# Patient Record
Sex: Male | Born: 1935 | Race: White | Hispanic: No | Smoking: Former smoker
Health system: Southern US, Community
[De-identification: ages and names within clinical notes are randomized; demographics above are authoritative.]

## PROBLEM LIST (undated history)

## (undated) DIAGNOSIS — R1031 Right lower quadrant pain: Secondary | ICD-10-CM

## (undated) DIAGNOSIS — M069 Rheumatoid arthritis, unspecified: Secondary | ICD-10-CM

## (undated) DIAGNOSIS — N138 Other obstructive and reflux uropathy: Secondary | ICD-10-CM

## (undated) DIAGNOSIS — E669 Obesity, unspecified: Secondary | ICD-10-CM

## (undated) DIAGNOSIS — E785 Hyperlipidemia, unspecified: Secondary | ICD-10-CM

## (undated) DIAGNOSIS — I447 Left bundle-branch block, unspecified: Secondary | ICD-10-CM

## (undated) DIAGNOSIS — I10 Essential (primary) hypertension: Secondary | ICD-10-CM

## (undated) DIAGNOSIS — I639 Cerebral infarction, unspecified: Secondary | ICD-10-CM

## (undated) DIAGNOSIS — I350 Nonrheumatic aortic (valve) stenosis: Secondary | ICD-10-CM

## (undated) DIAGNOSIS — G629 Polyneuropathy, unspecified: Secondary | ICD-10-CM

## (undated) DIAGNOSIS — E039 Hypothyroidism, unspecified: Secondary | ICD-10-CM

## (undated) DIAGNOSIS — I2510 Atherosclerotic heart disease of native coronary artery without angina pectoris: Secondary | ICD-10-CM

## (undated) DIAGNOSIS — N401 Enlarged prostate with lower urinary tract symptoms: Secondary | ICD-10-CM

## (undated) DIAGNOSIS — S46001A Unspecified injury of muscle(s) and tendon(s) of the rotator cuff of right shoulder, initial encounter: Secondary | ICD-10-CM

## (undated) DIAGNOSIS — E119 Type 2 diabetes mellitus without complications: Secondary | ICD-10-CM

## (undated) DIAGNOSIS — M1990 Unspecified osteoarthritis, unspecified site: Secondary | ICD-10-CM

## (undated) HISTORY — PX: OTHER SURGICAL HISTORY: SHX169

## (undated) HISTORY — DX: Hypothyroidism, unspecified: E03.9

## (undated) HISTORY — DX: Essential (primary) hypertension: I10

## (undated) HISTORY — DX: Rheumatoid arthritis, unspecified: M06.9

## (undated) HISTORY — DX: Type 2 diabetes mellitus without complications: E11.9

## (undated) HISTORY — DX: Cerebral infarction, unspecified: I63.9

## (undated) HISTORY — DX: Unspecified injury of muscle(s) and tendon(s) of the rotator cuff of right shoulder, initial encounter: S46.001A

## (undated) HISTORY — DX: Benign prostatic hyperplasia with lower urinary tract symptoms: N40.1

## (undated) HISTORY — DX: Obesity, unspecified: E66.9

## (undated) HISTORY — DX: Other obstructive and reflux uropathy: N13.8

## (undated) HISTORY — DX: Hyperlipidemia, unspecified: E78.5

## (undated) HISTORY — PX: CHOLECYSTECTOMY: SHX55

## (undated) HISTORY — DX: Unspecified osteoarthritis, unspecified site: M19.90

## (undated) HISTORY — DX: Right lower quadrant pain: R10.31

## (undated) HISTORY — PX: TONSILLECTOMY: SUR1361

## (undated) HISTORY — DX: Polyneuropathy, unspecified: G62.9

---

## 2004-06-02 ENCOUNTER — Ambulatory Visit: Payer: Self-pay | Admitting: Internal Medicine

## 2004-11-03 ENCOUNTER — Other Ambulatory Visit: Payer: Self-pay

## 2004-11-03 ENCOUNTER — Observation Stay: Payer: Self-pay | Admitting: Internal Medicine

## 2005-02-07 ENCOUNTER — Other Ambulatory Visit: Payer: Self-pay

## 2005-02-07 ENCOUNTER — Inpatient Hospital Stay: Payer: Self-pay | Admitting: Internal Medicine

## 2005-08-19 ENCOUNTER — Ambulatory Visit (HOSPITAL_COMMUNITY): Admission: RE | Admit: 2005-08-19 | Discharge: 2005-08-19 | Payer: Self-pay | Admitting: Unknown Physician Specialty

## 2005-09-22 ENCOUNTER — Ambulatory Visit: Payer: Self-pay | Admitting: Unknown Physician Specialty

## 2005-09-22 ENCOUNTER — Other Ambulatory Visit: Payer: Self-pay

## 2005-09-29 ENCOUNTER — Ambulatory Visit: Payer: Self-pay | Admitting: Unknown Physician Specialty

## 2005-11-11 ENCOUNTER — Ambulatory Visit: Payer: Self-pay

## 2006-11-01 ENCOUNTER — Ambulatory Visit: Payer: Self-pay | Admitting: Unknown Physician Specialty

## 2010-01-19 ENCOUNTER — Ambulatory Visit: Payer: Self-pay | Admitting: Ophthalmology

## 2010-01-25 ENCOUNTER — Ambulatory Visit: Payer: Self-pay | Admitting: Ophthalmology

## 2010-02-22 ENCOUNTER — Ambulatory Visit: Payer: Self-pay | Admitting: Unknown Physician Specialty

## 2010-02-24 LAB — PATHOLOGY REPORT

## 2011-04-25 ENCOUNTER — Ambulatory Visit: Payer: Self-pay | Admitting: Internal Medicine

## 2012-02-06 ENCOUNTER — Ambulatory Visit: Payer: Self-pay | Admitting: Internal Medicine

## 2012-04-02 ENCOUNTER — Ambulatory Visit: Payer: Self-pay | Admitting: Unknown Physician Specialty

## 2012-07-17 ENCOUNTER — Ambulatory Visit: Payer: Self-pay | Admitting: Unknown Physician Specialty

## 2012-10-19 ENCOUNTER — Ambulatory Visit: Payer: Self-pay | Admitting: Vascular Surgery

## 2013-03-28 ENCOUNTER — Ambulatory Visit: Payer: Self-pay | Admitting: Internal Medicine

## 2013-03-31 ENCOUNTER — Emergency Department: Payer: Self-pay | Admitting: Emergency Medicine

## 2013-03-31 LAB — BASIC METABOLIC PANEL
BUN: 22 mg/dL — ABNORMAL HIGH (ref 7–18)
Calcium, Total: 8.6 mg/dL (ref 8.5–10.1)
Creatinine: 1.1 mg/dL (ref 0.60–1.30)
Potassium: 4 mmol/L (ref 3.5–5.1)

## 2013-03-31 LAB — CBC
HCT: 44.7 % (ref 40.0–52.0)
HGB: 15.5 g/dL (ref 13.0–18.0)
MCHC: 34.7 g/dL (ref 32.0–36.0)
MCV: 92 fL (ref 80–100)
RBC: 4.88 10*6/uL (ref 4.40–5.90)
WBC: 11.7 10*3/uL — ABNORMAL HIGH (ref 3.8–10.6)

## 2013-03-31 LAB — TROPONIN I: Troponin-I: <0.02 ng/mL

## 2013-08-12 DIAGNOSIS — I10 Essential (primary) hypertension: Secondary | ICD-10-CM | POA: Insufficient documentation

## 2013-08-12 DIAGNOSIS — H9190 Unspecified hearing loss, unspecified ear: Secondary | ICD-10-CM | POA: Insufficient documentation

## 2013-08-12 DIAGNOSIS — E139 Other specified diabetes mellitus without complications: Secondary | ICD-10-CM | POA: Insufficient documentation

## 2013-08-13 DIAGNOSIS — I350 Nonrheumatic aortic (valve) stenosis: Secondary | ICD-10-CM | POA: Insufficient documentation

## 2013-12-14 DIAGNOSIS — E785 Hyperlipidemia, unspecified: Secondary | ICD-10-CM | POA: Insufficient documentation

## 2013-12-14 DIAGNOSIS — N400 Enlarged prostate without lower urinary tract symptoms: Secondary | ICD-10-CM | POA: Insufficient documentation

## 2013-12-14 DIAGNOSIS — Z8673 Personal history of transient ischemic attack (TIA), and cerebral infarction without residual deficits: Secondary | ICD-10-CM | POA: Insufficient documentation

## 2013-12-14 DIAGNOSIS — E669 Obesity, unspecified: Secondary | ICD-10-CM | POA: Insufficient documentation

## 2013-12-14 DIAGNOSIS — Z8709 Personal history of other diseases of the respiratory system: Secondary | ICD-10-CM | POA: Insufficient documentation

## 2013-12-14 DIAGNOSIS — M1990 Unspecified osteoarthritis, unspecified site: Secondary | ICD-10-CM | POA: Insufficient documentation

## 2013-12-14 DIAGNOSIS — G629 Polyneuropathy, unspecified: Secondary | ICD-10-CM | POA: Insufficient documentation

## 2013-12-14 DIAGNOSIS — E039 Hypothyroidism, unspecified: Secondary | ICD-10-CM | POA: Insufficient documentation

## 2013-12-16 DIAGNOSIS — I779 Disorder of arteries and arterioles, unspecified: Secondary | ICD-10-CM | POA: Insufficient documentation

## 2013-12-16 DIAGNOSIS — G4700 Insomnia, unspecified: Secondary | ICD-10-CM | POA: Insufficient documentation

## 2014-06-19 ENCOUNTER — Ambulatory Visit: Payer: Self-pay

## 2014-07-17 ENCOUNTER — Ambulatory Visit: Payer: Self-pay | Admitting: Internal Medicine

## 2014-08-07 ENCOUNTER — Observation Stay: Payer: Self-pay | Admitting: Internal Medicine

## 2014-08-07 LAB — URINALYSIS, COMPLETE
BACTERIA: NONE SEEN
BILIRUBIN, UR: NEGATIVE
Blood: NEGATIVE
LEUKOCYTE ESTERASE: NEGATIVE
Nitrite: NEGATIVE
Ph: 5 (ref 4.5–8.0)
Protein: 100
SQUAMOUS EPITHELIAL: NONE SEEN
Specific Gravity: 1.025 (ref 1.003–1.030)

## 2014-08-07 LAB — COMPREHENSIVE METABOLIC PANEL
ALBUMIN: 4.1 g/dL (ref 3.4–5.0)
ALK PHOS: 102 U/L (ref 46–116)
ANION GAP: 5 — AB (ref 7–16)
AST: 18 U/L (ref 15–37)
BUN: 24 mg/dL — AB (ref 7–18)
Bilirubin,Total: 1.2 mg/dL — ABNORMAL HIGH (ref 0.2–1.0)
CHLORIDE: 104 mmol/L (ref 98–107)
CO2: 30 mmol/L (ref 21–32)
CREATININE: 0.9 mg/dL (ref 0.60–1.30)
Calcium, Total: 9.1 mg/dL (ref 8.5–10.1)
EGFR (African American): >60
Glucose: 236 mg/dL — ABNORMAL HIGH (ref 65–99)
OSMOLALITY: 289 (ref 275–301)
Potassium: 4.1 mmol/L (ref 3.5–5.1)
SGPT (ALT): 22 U/L (ref 14–63)
SODIUM: 139 mmol/L (ref 136–145)
Total Protein: 7.1 g/dL (ref 6.4–8.2)

## 2014-08-07 LAB — CBC
HCT: 50.4 % (ref 40.0–52.0)
HGB: 16.8 g/dL (ref 13.0–18.0)
MCH: 31.1 pg (ref 26.0–34.0)
MCHC: 33.4 g/dL (ref 32.0–36.0)
MCV: 93 fL (ref 80–100)
Platelet: 123 10*3/uL — ABNORMAL LOW (ref 150–440)
RBC: 5.41 10*6/uL (ref 4.40–5.90)
RDW: 13 % (ref 11.5–14.5)
WBC: 13.1 10*3/uL — ABNORMAL HIGH (ref 3.8–10.6)

## 2014-08-07 LAB — TROPONIN I

## 2014-08-08 DIAGNOSIS — I350 Nonrheumatic aortic (valve) stenosis: Secondary | ICD-10-CM

## 2014-08-08 LAB — BASIC METABOLIC PANEL
ANION GAP: 9 (ref 7–16)
BUN: 22 mg/dL — ABNORMAL HIGH (ref 7–18)
CALCIUM: 8.4 mg/dL — AB (ref 8.5–10.1)
CREATININE: 0.92 mg/dL (ref 0.60–1.30)
Chloride: 107 mmol/L (ref 98–107)
Co2: 25 mmol/L (ref 21–32)
Glucose: 165 mg/dL — ABNORMAL HIGH (ref 65–99)
Osmolality: 288 (ref 275–301)
Potassium: 3.6 mmol/L (ref 3.5–5.1)
SODIUM: 141 mmol/L (ref 136–145)

## 2014-08-08 LAB — LIPID PANEL
Cholesterol: 148 mg/dL (ref 0–200)
HDL Cholesterol: 28 mg/dL — ABNORMAL LOW (ref 40–60)
LDL CHOLESTEROL, CALC: 81 mg/dL (ref 0–100)
Triglycerides: 195 mg/dL (ref 0–200)
VLDL CHOLESTEROL, CALC: 39 mg/dL (ref 5–40)

## 2014-08-08 LAB — HEMOGLOBIN A1C: Hemoglobin A1C: 7.3 % — ABNORMAL HIGH (ref 4.2–6.3)

## 2014-08-09 LAB — CBC WITH DIFFERENTIAL/PLATELET
BANDS NEUTROPHIL: 1 %
BASOS ABS: 2 %
Comment - H1-Com3: NORMAL
EOS PCT: 4 %
HCT: 43.4 % (ref 40.0–52.0)
HGB: 15.1 g/dL (ref 13.0–18.0)
Lymphocytes: 22 %
MCH: 32.1 pg (ref 26.0–34.0)
MCHC: 34.9 g/dL (ref 32.0–36.0)
MCV: 92 fL (ref 80–100)
MONOS PCT: 10 %
Platelet: 120 10*3/uL — ABNORMAL LOW (ref 150–440)
RBC: 4.71 10*6/uL (ref 4.40–5.90)
RDW: 13.3 % (ref 11.5–14.5)
Segmented Neutrophils: 59 %
Variant Lymphocyte - H1-Rlymph: 2 %
WBC: 10.7 10*3/uL — AB (ref 3.8–10.6)

## 2014-08-09 LAB — BASIC METABOLIC PANEL
Anion Gap: 8 (ref 7–16)
BUN: 19 mg/dL — ABNORMAL HIGH (ref 7–18)
CALCIUM: 8.1 mg/dL — AB (ref 8.5–10.1)
CHLORIDE: 109 mmol/L — AB (ref 98–107)
CO2: 25 mmol/L (ref 21–32)
CREATININE: 0.87 mg/dL (ref 0.60–1.30)
EGFR (African American): >60
Glucose: 150 mg/dL — ABNORMAL HIGH (ref 65–99)
Osmolality: 288 (ref 275–301)
Potassium: 3.4 mmol/L — ABNORMAL LOW (ref 3.5–5.1)
SODIUM: 142 mmol/L (ref 136–145)

## 2014-08-10 LAB — BASIC METABOLIC PANEL
Anion Gap: 7 (ref 7–16)
BUN: 19 mg/dL — AB (ref 7–18)
CALCIUM: 8.2 mg/dL — AB (ref 8.5–10.1)
CHLORIDE: 111 mmol/L — AB (ref 98–107)
Co2: 25 mmol/L (ref 21–32)
Creatinine: 0.88 mg/dL (ref 0.60–1.30)
EGFR (African American): >60
Glucose: 163 mg/dL — ABNORMAL HIGH (ref 65–99)
OSMOLALITY: 291 (ref 275–301)
POTASSIUM: 3.7 mmol/L (ref 3.5–5.1)
SODIUM: 143 mmol/L (ref 136–145)

## 2014-08-10 LAB — CBC WITH DIFFERENTIAL/PLATELET
BASOS ABS: 0.1 10*3/uL (ref 0.0–0.1)
Basophil %: 0.6 %
EOS ABS: 0.2 10*3/uL (ref 0.0–0.7)
Eosinophil %: 2 %
HCT: 42.4 % (ref 40.0–52.0)
HGB: 14.4 g/dL (ref 13.0–18.0)
LYMPHS ABS: 4.3 10*3/uL — AB (ref 1.0–3.6)
Lymphocyte %: 45.7 %
MCH: 31.5 pg (ref 26.0–34.0)
MCHC: 34 g/dL (ref 32.0–36.0)
MCV: 93 fL (ref 80–100)
MONO ABS: 0.7 x10 3/mm (ref 0.2–1.0)
Monocyte %: 7.8 %
NEUTROS PCT: 43.9 %
Neutrophil #: 4.2 10*3/uL (ref 1.4–6.5)
PLATELETS: 120 10*3/uL — AB (ref 150–440)
RBC: 4.58 10*6/uL (ref 4.40–5.90)
RDW: 13.1 % (ref 11.5–14.5)
WBC: 9.5 10*3/uL (ref 3.8–10.6)

## 2014-08-11 LAB — CBC WITH DIFFERENTIAL/PLATELET
Comment - H1-Com1: NORMAL
Eosinophil: 2 %
HCT: 44.6 % (ref 40.0–52.0)
HGB: 15.2 g/dL (ref 13.0–18.0)
Lymphocytes: 26 %
MCH: 31.8 pg (ref 26.0–34.0)
MCHC: 34 g/dL (ref 32.0–36.0)
MCV: 93 fL (ref 80–100)
MONOS PCT: 4 %
PLATELETS: 130 10*3/uL — AB (ref 150–440)
RBC: 4.77 10*6/uL (ref 4.40–5.90)
RDW: 13.3 % (ref 11.5–14.5)
Segmented Neutrophils: 63 %
Variant Lymphocyte - H1-Rlymph: 5 %
WBC: 10 10*3/uL (ref 3.8–10.6)

## 2014-08-11 LAB — BASIC METABOLIC PANEL
Anion Gap: 6 — ABNORMAL LOW (ref 7–16)
BUN: 17 mg/dL (ref 7–18)
CALCIUM: 9 mg/dL (ref 8.5–10.1)
CREATININE: 0.87 mg/dL (ref 0.60–1.30)
Chloride: 108 mmol/L — ABNORMAL HIGH (ref 98–107)
Co2: 26 mmol/L (ref 21–32)
GLUCOSE: 183 mg/dL — AB (ref 65–99)
OSMOLALITY: 286 (ref 275–301)
POTASSIUM: 3.9 mmol/L (ref 3.5–5.1)
SODIUM: 140 mmol/L (ref 136–145)

## 2014-11-02 NOTE — Discharge Summary (Signed)
PATIENT NAME:  Chad Rollins, Chad Rollins MR#:  [id number] DATE OF BIRTH:  Dec 24, 1935  DATE OF ADMISSION:  08/07/2014 DATE OF DISCHARGE:  08/11/2014  ADMISSION DIAGNOSIS: Confusion and disorientation.   HISTORY OF PRESENT ILLNESS: Please see the dictated HPI done by Dr. Bridgett Larsson on 08/07/2014.   PAST MEDICAL HISTORY:  1. Previous stroke.  2. Type 2 diabetes.  3. Diabetic peripheral neuropathy.  4. Benign hypertension.  5. Hyperlipidemia.  6. Rheumatoid arthritis.  7. Obstructive sleep apnea.  8. Hypothyroidism.   MEDICATIONS ON ADMISSION: Please see admission note.   ALLERGIES: TYLENOL, XYLOCAINE, AND MARCAINE.   SOCIAL HISTORY, FAMILY HISTORY, REVIEW OF SYSTEMS: As per admission note.   PHYSICAL EXAMINATION:  GENERAL: The patient was in no acute distress.  VITAL SIGNS: Remarkable for a blood pressure of 199/98 with a temperature of 98.1, a heart rate of 103.  HEENT: Unremarkable.  NECK: Supple without JVD.  LUNGS: Clear.  CARDIAC: Revealed a regular rate and rhythm, normal S1, S2.  ABDOMEN: Soft and nontender.  EXTREMITIES: Without edema.  NEUROLOGIC: Grossly nonfocal other than the patient's mental status.   HOSPITAL COURSE: The patient was admitted with altered mental status. Carotid Dopplers were unremarkable. Echocardiogram was unremarkable. MRI of the brain showed no acute stroke. He was seen by neurology who felt that the patient had a hypertensive encephalopathy. His blood pressure was controlled with medications and his symptoms improved. He was seen by physical therapy who recommended home health. By 08/11/2014, the patient was stable and ready for discharge.   DISCHARGE DIAGNOSES:  1. Altered mental status due to hypertensive encephalopathy.  2. Transient ischemic attack.   3. Type 2 diabetes.  4. Benign hypertension.  5. Hyperlipidemia.  6. Rheumatoid arthritis.  7. Hypothyroidism.   DISCHARGE MEDICATIONS:  1. Astelin nasal spray 1 puff in each nostril b.i.d.   2. Brimonidine eye drops as directed.  3. BuSpar 15 mg p.o. t.i.d.  4. Vitamin D3 2000 units p.o. daily.  5. B12 1000 mcg p.o. daily.  6. Timolol eye drops as directed.  7. Proscar 5 mg p.o. daily.  8. Flonase 2 puffs in each  nostril daily.  9. Latanoprost eyedrops as directed.  10. Synthroid 75 mcg p.o. daily.  11. Vigamox eyedrops as directed.  12. Prednisolone eyedrops as directed.  13. Flomax 0.4 mg p.o. daily.  14. Glipizide 5 mg p.o. b.i.d.  15. Metformin 1000 mg p.o. b.i.d.  16. Lopressor 25 mg p.o. b.i.d.  17. Norco 5/325 one p.o. q. 4 hours p.r.n. pain.  18. Losartan 50 mg p.o. b.i.d.  19. Lipitor 20 mg p.o. daily.  20. Aggrenox 1 p.o. b.i.d.  21. Nystatin powder apply t.i.d. to affected area.   FOLLOWUP PLANS AND APPOINTMENTS: The patient was discharged on an 1800 calorie ADA diet. He will be followed by home health physical therapy and nursing. He will follow with me in 1-2 weeks. Sooner if needed.    ____________________________ Leonie Douglas Doy Hutching, MD jds:bu D: 08/18/2014 10:25:43 ET T: 08/18/2014 13:04:38 ET JOB#: [id number]  cc: Leonie Douglas. Doy Hutching, MD, <Dictator> Sudie Bandel Lennice Sites MD ELECTRONICALLY SIGNED 08/18/2014 20:50

## 2014-11-02 NOTE — H&P (Signed)
PATIENT NAME:  Chad Rollins, Chad Rollins MR#:  [id number] DATE OF BIRTH:  1935-12-14  DATE OF ADMISSION:  08/07/2014  PRIMARY CARE PHYSICIAN: Leonie Douglas. Doy Hutching, MD    REFERRING PHYSICIAN: Jimmye Norman.  CHIEF COMPLAINT: Confusion and disorientation today.   HISTORY OF PRESENT ILLNESS: A 79 year old Caucasian male with a history of CVA, hypertension, diabetes, hyperlipidemia, who was sent from home due to confusion and disorientation today. The patient was just given Ativan, is sedating, unable to answer any questions. According to the patient's daughter, the patient was fine until today. The patient was noticed to have confusion and disorientation at home by a caregiver, so the patient was sent to the ED for further evaluation. Since the patient has claustrophobia, the patient was given Ativan for MRI of the brain.   PAST MEDICAL HISTORY: CVA on the left side, diabetes with peripheral neuropathy, hypertension, hyperlipidemia, hypothyroidism, rheumatoid arthritis, obstructive sleep apnea, right rotator cuff injury not requiring surgery.   PAST SURGICAL HISTORY: Cholecystectomy, tonsillectomy, upper palate surgery for obstructive sleep apnea, trigger finger release, and glaucoma with macular degeneration, status post laser treatment recently.   FAMILY HISTORY: Mother died at 79 secondary to diabetes and diabetic complications. Father died  in his 32s.   SOCIAL HISTORY: Quit smoking in 1975. No history of alcohol abuse or drug abuse.   ALLERGIES:  MARCAINE, TYLENOL, XYLOCAINE   HOME MEDICATIONS: Tramadol 50 mg p.o. 1-2 tablets p.r.n., Protonix 40 mg p.o. daily, Percocet 1-2 tablets 5/325, 1-2 tablets p.r.n., metformin 1000 mg p.o. b.i.d., lovastatin 40 mg p.o. daily, levothyroxine 75 mcg p.o. daily, glipizide 5 mg p.o. b.i.d., Aggrenox 1 cap p.o. b.i.d., Aleve  2 tablets b.i.d. p.o.    REVIEW OF SYSTEMS: Unable to obtain at this time.   PHYSICAL EXAMINATION: VITAL SIGNS: Temperature 98.1, blood pressure  168/89, was 199/98; pulse 103, oxygen saturation 98% on room air.  GENERAL: The patient is sleepy, not following commands, in no acute distress, obese.  HEENT: Pupils are round, equal 2 mm in diameter, not reactive to light. No discharge from ear or nose.  NECK: Supple. No JVD or carotid bruit. No lymphadenopathy and no thyromegaly, unable to do oral exam.  CARDIOVASCULAR: S1, S2 regular rate and rhythm. No murmurs or gallops.  PULMONARY: Bilateral air entry. No wheezing or rales. No use of accessory muscles to breathe.  ABDOMEN: Soft. No distention or tenderness. No organomegaly. Bowel sounds present.  EXTREMITIES: No edema, clubbing, or cyanosis. Bilateral pedal pulses present.  SKIN: No rash or jaundice.  NEUROLOGICAL: The patient is sleeping, not following commands, unable to examine at this time.   IMAGING STUDIES: CAT scan of head showed mild chronic ischemic white matter disease, no acute intracranial abnormality. Chest x-ray: No acute findings.   LABORATORY DATA: WBC 13.1, hemoglobin 16.8, platelets 123. Glucose was 236, BUN 24, creatinine 0.9. Electrolytes normal. Troponin less than 0.02.   IMPRESSIONS: 1. Altered mental status, need to rule out cerebrovascular accident.  2. Hypertension.  3. Diabetes.  4. Dehydration.  5. Leukocytosis, need to rule out urinary tract infection.  6. Hyperlipidemia.  7. Obstructive sleep apnea.   PLAN OF TREATMENT: 1. The patient will be placed for observation. We will continue Aggrenox and statin, and get an MRI of the brain, echocardiograph, and carotid duplex.  2. Aspiration and fall precaution.  3. For dehydration, we will give IV fluid support and follow up BMP.  4. For leukocytosis, we will check a urinalysis.  5. The patient also has a small thrombocytopenia,  which is chronic. Will follow up a CBC.  6. I discussed the patient's condition and plan of treatment with the patient's daughter, who is the health power of attorney. She said the  patient is full code for now.   TIME SPENT: About 52 minutes.    ____________________________ Demetrios Loll, MD qc:mw D: 08/07/2014 15:50:04 ET T: 08/07/2014 16:15:20 ET JOB#: [id number]  cc: Demetrios Loll, MD, <Dictator> Demetrios Loll MD ELECTRONICALLY SIGNED 08/11/2014 11:54

## 2014-11-02 NOTE — Consult Note (Signed)
PATIENT NAME:  Chad Rollins, Chad Rollins MR#:  [id number] DATE OF BIRTH:  1935/08/12  DATE OF CONSULTATION:  08/09/2014  REASON FOR CONSULTATION: Confusion.   HISTORY OF PRESENT ILLNESS: This is a 32 gentleman with past medical history of stroke, hypertension, diabetes, hyperlipidemia, presenting with confusion and disorientation. Throughout the hospital stay, the patient's mental status has improved. Status post MRI of the brain that shows small vessel disease, old ischemic strokes.   PAST MEDICAL HISTORY: Left-sided stroke, diabetes and peripheral neuropathy.   FAMILY HISTORY: Mother died at the age of 60 due to diabetes. Father died in his 38s.   SOCIAL HISTORY: Quit smoking in 1975. No EtOH or drug use.   HOME MEDICATIONS: Have been reviewed.   IMAGING: As described above.   NEUROLOGIC EVALUATION: The patient is awake and alert to his name, hospital name, as well as the month. Could not tell me the date, and was confused of the reason why he is in the hospital. Extraocular movements intact. Speech is fluent. No signs of dysarthria. No signs of aphasia. Extraocular movements are intact. Tongue is midline. Uvula elevates symmetrically. Shoulder shrug is intact. Motor strength: Slight drift on the right upper extremity from the chronic stroke. No other motor deficits. Sensation intact to light touch and temperature. Coordination: Finger-to-nose intact.   IMPRESSION: A 79 year old male with history of stroke, hypertension, hyperlipidemia, presented with confusion, disorientation. The patient's mental status appears to have improved. MRI of the brain: No acute intracranial abnormality. Likely dehydration. The patient is getting hydrated with IV fluids,  combination with delirium. Mental status is improving. No further intervention from a neurological standpoint. I do not think this was a seizure activity, and no need for electroencephalogram or further imaging.   Thank you very much. It was a pleasure seeing  this patient.  ____________________________ Leotis Pain, MD yz:ap D: 08/09/2014 14:08:35 ET T: 08/09/2014 14:44:21 ET JOB#: [id number]  cc: Leotis Pain, MD, <Dictator> Leotis Pain MD ELECTRONICALLY SIGNED 08/12/2014 12:10

## 2014-12-08 DIAGNOSIS — E119 Type 2 diabetes mellitus without complications: Secondary | ICD-10-CM | POA: Insufficient documentation

## 2014-12-08 DIAGNOSIS — H409 Unspecified glaucoma: Secondary | ICD-10-CM | POA: Insufficient documentation

## 2015-01-16 ENCOUNTER — Encounter: Payer: Self-pay | Admitting: *Deleted

## 2015-01-21 ENCOUNTER — Ambulatory Visit (INDEPENDENT_AMBULATORY_CARE_PROVIDER_SITE_OTHER): Payer: Medicare Other | Admitting: Urology

## 2015-01-21 ENCOUNTER — Encounter: Payer: Self-pay | Admitting: Urology

## 2015-01-21 VITALS — BP 124/67 | HR 61 | Ht 69.0 in | Wt 218.1 lb

## 2015-01-21 DIAGNOSIS — N401 Enlarged prostate with lower urinary tract symptoms: Secondary | ICD-10-CM

## 2015-01-21 DIAGNOSIS — N138 Other obstructive and reflux uropathy: Secondary | ICD-10-CM | POA: Insufficient documentation

## 2015-01-21 DIAGNOSIS — R1031 Right lower quadrant pain: Secondary | ICD-10-CM | POA: Diagnosis not present

## 2015-01-21 LAB — BLADDER SCAN AMB NON-IMAGING: SCAN RESULT: 44

## 2015-01-21 NOTE — Progress Notes (Signed)
01/21/2015 3:53 PM   Chad Rollins December 21, 1935 [id number]  Referring provider: No referring provider defined for this encounter.  Chief Complaint  Patient presents with  . Benign Prostatic Hypertrophy    6 month follow up  . Abdominal Pain    RLQ    HPI: Mr. Devita is a 79 year old white male who has a history of BPH with LUTS who presents for a 6 months follow up.  His IPSS score is 7, which is mild lower urinary tract symptomatology. He is mostly satisfied with his quality life due to his urinary symptoms.  His PVR is 44 mL.  He has not had any recent fevers, chills, nausea or vomiting. He also denies any dysuria, gross hematuria or suprapubic pain.  He has been experiencing intermittent left lower right quadrant pain which can become quite intense at times for the last several months. He is had several CT scans and no etiology can be found for his pain. He has an upcoming appointment with gastroenterology in September for further workup of this pain.      IPSS      01/21/15 1500       International Prostate Symptom Score   How often have you had the sensation of not emptying your bladder? Not at All  0     How often have you had to urinate less than every two hours? Less than half the time     How often have you found you stopped and started again several times when you urinated? Less than 1 in 5 times     How often have you found it difficult to postpone urination? Less than half the time     How often have you had a weak urinary stream? Not at All     How often have you had to strain to start urination? Not at All     How many times did you typically get up at night to urinate? 2 Times     Total IPSS Score 7     Quality of Life due to urinary symptoms   If you were to spend the rest of your life with your urinary condition just the way it is now how would you feel about that? Mostly Satisfied        Score:  1-7 Mild 8-19 Moderate 20-35 Severe   PMH: Past  Medical History  Diagnosis Date  . Rheumatoid arthritis   . Stroke   . Peripheral neuropathy   . Diabetes mellitus   . HLD (hyperlipidemia)   . Allergic rhinitis   . Aortic stenosis   . Hypothyroidism   . Injury of right rotator cuff   . Osteoarthritis   . Obesity   . Continuous right lower quadrant pain   . BPH with obstruction/lower urinary tract symptoms   . HTN (hypertension)     Surgical History: Past Surgical History  Procedure Laterality Date  . Cholecystectomy    . Surgery for sleep apnea    . Lipoma excisions    . Tonsillectomy      Home Medications:    Medication List       This list is accurate as of: 01/21/15  3:53 PM.  Always use your most recent med list.               AGGRENOX 200-25 MG per 12 hr capsule  Generic drug:  dipyridamole-aspirin     aspirin EC 81 MG tablet  Take 81  mg by mouth daily.     atorvastatin 20 MG tablet  Commonly known as:  LIPITOR  Take by mouth.     AZELASTINE-FLUTICASONE NA  Place into the nose as needed.     brimonidine 0.1 % Soln  Commonly known as:  ALPHAGAN P     busPIRone 15 MG tablet  Commonly known as:  BUSPAR  Take by mouth.     carboxymethylcellulose 0.5 % Soln  Commonly known as:  REFRESH PLUS  Apply to eye.     cloNIDine 0.1 MG tablet  Commonly known as:  CATAPRES  Take by mouth.     clopidogrel 75 MG tablet  Commonly known as:  PLAVIX  Take 75 mg by mouth daily.     dorzolamide 2 % ophthalmic solution  Commonly known as:  TRUSOPT  Apply to eye.     dutasteride 0.5 MG capsule  Commonly known as:  AVODART  Take 0.5 mg by mouth daily.     finasteride 5 MG tablet  Commonly known as:  PROSCAR  Take 5 mg by mouth daily.     fluocinonide cream 0.05 %  Commonly known as:  LIDEX     glipiZIDE 5 MG tablet  Commonly known as:  GLUCOTROL  Take 5 mg by mouth daily before breakfast.     HYDROcodone-acetaminophen 5-325 MG per tablet  Commonly known as:  NORCO/VICODIN  Take by mouth.      latanoprost 0.005 % ophthalmic solution  Commonly known as:  XALATAN  Apply to eye.     levothyroxine 75 MCG tablet  Commonly known as:  SYNTHROID, LEVOTHROID  Take 75 mcg by mouth daily before breakfast.     losartan 50 MG tablet  Commonly known as:  COZAAR  Take by mouth.     metFORMIN 1000 MG tablet  Commonly known as:  GLUCOPHAGE  Take 1,000 mg by mouth 2 (two) times daily with a meal.     methocarbamol 500 MG tablet  Commonly known as:  ROBAXIN     metoprolol tartrate 25 MG tablet  Commonly known as:  LOPRESSOR  Take by mouth.     naproxen sodium 220 MG tablet  Commonly known as:  ANAPROX  Take 220 mg by mouth 2 (two) times daily with a meal.     nystatin powder  Commonly known as:  MYCOSTATIN     prednisoLONE acetate 1 % ophthalmic suspension  Commonly known as:  PRED FORTE  Apply to eye.     RA VITAMIN B-12 TR 1000 MCG Tbcr  Generic drug:  Cyanocobalamin  Take by mouth.     sodium chloride 5 % ophthalmic solution  Commonly known as:  MURO 128  Apply to eye.     tamsulosin 0.4 MG Caps capsule  Commonly known as:  FLOMAX  Take by mouth.     timolol 0.5 % ophthalmic solution  Commonly known as:  TIMOPTIC  Apply to eye.     Vitamin D3 2000 UNITS capsule  Take by mouth.     zolpidem 10 MG tablet  Commonly known as:  AMBIEN  Take 10 mg by mouth at bedtime as needed for sleep.        Allergies:  Allergies  Allergen Reactions  . Tylenol [Acetaminophen]     Family History: Family History  Problem Relation Age of Onset  . Prostate cancer Neg Hx   . Bladder Cancer Neg Hx   . Kidney disease Neg Hx     Social  History:  reports that he has quit smoking. He does not have any smokeless tobacco history on file. He reports that he does not drink alcohol or use illicit drugs.  ROS: UROLOGY Frequent Urination?: No Hard to postpone urination?: No Burning/pain with urination?: No Get up at night to urinate?: No Leakage of urine?: No Urine stream  starts and stops?: No Do you have to strain to urinate?: No Blood in urine?: No Urinary tract infection?: No Sexually transmitted disease?: No Injury to kidneys or bladder?: No Painful intercourse?: No Weak stream?: No Erection problems?: No Penile pain?: No  Gastrointestinal Nausea?: No Vomiting?: No Indigestion/heartburn?: No Diarrhea?: No Constipation?: No  Constitutional Fever: No Night sweats?: No Weight loss?: No Fatigue?: No  Skin Skin rash/lesions?: No Itching?: No  Eyes Blurred vision?: No Double vision?: No  Ears/Nose/Throat Sore throat?: No Sinus problems?: No  Hematologic/Lymphatic Swollen glands?: No Easy bruising?: No  Cardiovascular Leg swelling?: No Chest pain?: No  Respiratory Cough?: No Shortness of breath?: No  Endocrine Excessive thirst?: No  Musculoskeletal Back pain?: Yes Joint pain?: No  Neurological Headaches?: No Dizziness?: Yes  Psychologic Depression?: No Anxiety?: No  Physical Exam: BP 124/67 mmHg  Pulse 61  Ht 5\' 9"  (1.753 m)  Wt 218 lb 1.6 oz (98.93 kg)  BMI 32.19 kg/m2  GU: Patient with circumcised phallus.  Urethral meatus is patent.  No penile discharge. No penile lesions or rashes. Scrotum without lesions, cysts, rashes and/or edema.  Testicles are located scrotally bilaterally. No masses are appreciated in the testicles. Left and right epididymis are normal.   Rectal: Patient with  normal sphincter tone. Perineum without scarring or rashes. No rectal masses are appreciated. Prostate is approximately 45 grams, no nodules are appreciated. Seminal vesicles are normal.   Laboratory Data: Results for orders placed or performed in visit on 01/21/15  BLADDER SCAN AMB NON-IMAGING  Result Value Ref Range   Scan Result 44    Lab Results  Component Value Date   WBC 10.0 08/11/2014   HGB 15.2 08/11/2014   HCT 44.6 08/11/2014   MCV 93 08/11/2014   PLT 130* 08/11/2014    Lab Results  Component Value Date    CREATININE 0.87 08/11/2014    No results found for: PSA  No results found for: TESTOSTERONE  No results found for: HGBA1C  Urinalysis    Component Value Date/Time   COLORURINE Yellow 08/07/2014 2156   APPEARANCEUR Clear 08/07/2014 2156   LABSPEC 1.025 08/07/2014 2156   PHURINE 5.0 08/07/2014 2156   GLUCOSEU >=500 08/07/2014 2156   HGBUR Negative 08/07/2014 2156   BILIRUBINUR Negative 08/07/2014 2156   KETONESUR 1+ 08/07/2014 2156   PROTEINUR 100 mg/dL 08/07/2014 2156   NITRITE Negative 08/07/2014 2156   LEUKOCYTESUR Negative 08/07/2014 2156    Pertinent Imaging:   Assessment & Plan:    1. BPH (benign prostatic hyperplasia) with LUTS:   IPSS 7/2.  PVR 44 mL. PSA of 0.7 on 07/23/2014.  He will continue the finasteride. He will follow-up in one year's time for IPSS score and PVR.  - PSA - BLADDER SCAN AMB NON-IMAGING  2. RLQ:   Patient appears to be emptying his bladder completely and his urinalysis is negative. There is no urological etiology for the right lower quadrant pain. Patient will be evaluated by gastroenterology in September 2016.   No Follow-up on file.  Zara Council, [facility] [address] [place], [place] [address] [phone]

## 2015-01-22 ENCOUNTER — Telehealth: Payer: Self-pay

## 2015-01-22 LAB — PSA: PROSTATE SPECIFIC AG, SERUM: 0.5 ng/mL (ref 0.0–4.0)

## 2015-01-22 NOTE — Telephone Encounter (Signed)
-----   Message from Nori Riis, PA-C sent at 01/22/2015  8:24 AM EDT ----- Patient's PSA is stable.  We will see him in one year.  Please have him come in a week before his appointment for his blood work, so that I will have it available at his appointment with me.

## 2015-01-22 NOTE — Telephone Encounter (Signed)
Spoke with pt in reference to lab results. Pt voiced understanding.  

## 2015-03-06 ENCOUNTER — Other Ambulatory Visit: Payer: Self-pay

## 2015-03-06 DIAGNOSIS — N400 Enlarged prostate without lower urinary tract symptoms: Secondary | ICD-10-CM

## 2015-03-06 DIAGNOSIS — N200 Calculus of kidney: Secondary | ICD-10-CM

## 2015-03-06 DIAGNOSIS — Z86010 Personal history of colonic polyps: Secondary | ICD-10-CM | POA: Insufficient documentation

## 2015-03-06 MED ORDER — FINASTERIDE 5 MG PO TABS: 5.0000 mg | ORAL_TABLET | Freq: Every day | ORAL | Status: DC

## 2015-04-01 ENCOUNTER — Other Ambulatory Visit: Payer: Self-pay

## 2015-04-01 DIAGNOSIS — N400 Enlarged prostate without lower urinary tract symptoms: Secondary | ICD-10-CM

## 2015-04-01 MED ORDER — FINASTERIDE 5 MG PO TABS: 5.0000 mg | ORAL_TABLET | Freq: Every day | ORAL | Status: DC

## 2015-05-06 ENCOUNTER — Other Ambulatory Visit: Payer: Self-pay

## 2015-05-06 DIAGNOSIS — N400 Enlarged prostate without lower urinary tract symptoms: Secondary | ICD-10-CM

## 2015-05-06 MED ORDER — FINASTERIDE 5 MG PO TABS: 5.0000 mg | ORAL_TABLET | Freq: Every day | ORAL | Status: DC

## 2015-08-24 ENCOUNTER — Encounter: Payer: Self-pay | Admitting: Emergency Medicine

## 2015-08-24 ENCOUNTER — Emergency Department: Payer: PPO

## 2015-08-24 ENCOUNTER — Inpatient Hospital Stay
Admission: EM | Admit: 2015-08-24 | Discharge: 2015-08-26 | DRG: 558 | Disposition: A | Discharge status: Skilled Nursing Facility | Payer: PPO | Attending: Internal Medicine | Admitting: Internal Medicine

## 2015-08-24 DIAGNOSIS — Z8673 Personal history of transient ischemic attack (TIA), and cerebral infarction without residual deficits: Secondary | ICD-10-CM | POA: Diagnosis not present

## 2015-08-24 DIAGNOSIS — I10 Essential (primary) hypertension: Secondary | ICD-10-CM | POA: Diagnosis present

## 2015-08-24 DIAGNOSIS — Z9889 Other specified postprocedural states: Secondary | ICD-10-CM

## 2015-08-24 DIAGNOSIS — Y92009 Unspecified place in unspecified non-institutional (private) residence as the place of occurrence of the external cause: Secondary | ICD-10-CM

## 2015-08-24 DIAGNOSIS — W1830XA Fall on same level, unspecified, initial encounter: Secondary | ICD-10-CM | POA: Diagnosis not present

## 2015-08-24 DIAGNOSIS — D72829 Elevated white blood cell count, unspecified: Secondary | ICD-10-CM | POA: Diagnosis present

## 2015-08-24 DIAGNOSIS — M069 Rheumatoid arthritis, unspecified: Secondary | ICD-10-CM | POA: Diagnosis present

## 2015-08-24 DIAGNOSIS — E860 Dehydration: Secondary | ICD-10-CM | POA: Diagnosis not present

## 2015-08-24 DIAGNOSIS — R531 Weakness: Secondary | ICD-10-CM

## 2015-08-24 DIAGNOSIS — E1140 Type 2 diabetes mellitus with diabetic neuropathy, unspecified: Secondary | ICD-10-CM | POA: Diagnosis not present

## 2015-08-24 DIAGNOSIS — Z9049 Acquired absence of other specified parts of digestive tract: Secondary | ICD-10-CM

## 2015-08-24 DIAGNOSIS — R270 Ataxia, unspecified: Secondary | ICD-10-CM

## 2015-08-24 DIAGNOSIS — R7989 Other specified abnormal findings of blood chemistry: Secondary | ICD-10-CM | POA: Diagnosis not present

## 2015-08-24 DIAGNOSIS — E039 Hypothyroidism, unspecified: Secondary | ICD-10-CM | POA: Diagnosis present

## 2015-08-24 DIAGNOSIS — R748 Abnormal levels of other serum enzymes: Secondary | ICD-10-CM | POA: Diagnosis not present

## 2015-08-24 DIAGNOSIS — S0990XA Unspecified injury of head, initial encounter: Secondary | ICD-10-CM | POA: Diagnosis not present

## 2015-08-24 DIAGNOSIS — Z7982 Long term (current) use of aspirin: Secondary | ICD-10-CM

## 2015-08-24 DIAGNOSIS — M6282 Rhabdomyolysis: Principal | ICD-10-CM | POA: Diagnosis present

## 2015-08-24 DIAGNOSIS — I248 Other forms of acute ischemic heart disease: Secondary | ICD-10-CM | POA: Diagnosis not present

## 2015-08-24 DIAGNOSIS — R296 Repeated falls: Secondary | ICD-10-CM | POA: Diagnosis not present

## 2015-08-24 DIAGNOSIS — R778 Other specified abnormalities of plasma proteins: Secondary | ICD-10-CM | POA: Diagnosis present

## 2015-08-24 DIAGNOSIS — Z87891 Personal history of nicotine dependence: Secondary | ICD-10-CM | POA: Diagnosis not present

## 2015-08-24 DIAGNOSIS — J9811 Atelectasis: Secondary | ICD-10-CM | POA: Diagnosis not present

## 2015-08-24 DIAGNOSIS — E119 Type 2 diabetes mellitus without complications: Secondary | ICD-10-CM | POA: Diagnosis not present

## 2015-08-24 DIAGNOSIS — Z79899 Other long term (current) drug therapy: Secondary | ICD-10-CM

## 2015-08-24 DIAGNOSIS — Z8042 Family history of malignant neoplasm of prostate: Secondary | ICD-10-CM

## 2015-08-24 DIAGNOSIS — Z841 Family history of disorders of kidney and ureter: Secondary | ICD-10-CM | POA: Diagnosis not present

## 2015-08-24 LAB — LACTIC ACID, PLASMA
LACTIC ACID, VENOUS: 1.7 mmol/L (ref 0.5–2.0)
Lactic Acid, Venous: 2.8 mmol/L (ref 0.5–2.0)

## 2015-08-24 LAB — GLUCOSE, CAPILLARY
Glucose-Capillary: 197 mg/dL — ABNORMAL HIGH (ref 65–99)
Glucose-Capillary: 85 mg/dL (ref 65–99)

## 2015-08-24 LAB — COMPREHENSIVE METABOLIC PANEL
ALT: 18 U/L (ref 17–63)
ANION GAP: 9 (ref 5–15)
AST: 35 U/L (ref 15–41)
Albumin: 4.1 g/dL (ref 3.5–5.0)
Alkaline Phosphatase: 72 U/L (ref 38–126)
BILIRUBIN TOTAL: 1.3 mg/dL — AB (ref 0.3–1.2)
BUN: 26 mg/dL — ABNORMAL HIGH (ref 6–20)
CO2: 25 mmol/L (ref 22–32)
Calcium: 8.9 mg/dL (ref 8.9–10.3)
Chloride: 107 mmol/L (ref 101–111)
Creatinine, Ser: 1.25 mg/dL — ABNORMAL HIGH (ref 0.61–1.24)
GFR calc Af Amer: >60 mL/min (ref >60)
GFR, EST NON AFRICAN AMERICAN: 53 mL/min — AB (ref >60)
Glucose, Bld: 241 mg/dL — ABNORMAL HIGH (ref 65–99)
POTASSIUM: 4 mmol/L (ref 3.5–5.1)
Sodium: 141 mmol/L (ref 135–145)
TOTAL PROTEIN: 6.7 g/dL (ref 6.5–8.1)

## 2015-08-24 LAB — CBC WITH DIFFERENTIAL/PLATELET
BASOS ABS: 0 10*3/uL (ref 0–0.1)
Basophils Relative: 0 %
EOS ABS: 0 10*3/uL (ref 0–0.7)
EOS PCT: 0 %
HCT: 41.7 % (ref 40.0–52.0)
Hemoglobin: 14 g/dL (ref 13.0–18.0)
LYMPHS ABS: 4.6 10*3/uL — AB (ref 1.0–3.6)
Lymphocytes Relative: 30 %
MCH: 30.5 pg (ref 26.0–34.0)
MCHC: 33.5 g/dL (ref 32.0–36.0)
MCV: 91.1 fL (ref 80.0–100.0)
MONO ABS: 1.5 10*3/uL — AB (ref 0.2–1.0)
Monocytes Relative: 10 %
Neutro Abs: 9.5 10*3/uL — ABNORMAL HIGH (ref 1.4–6.5)
Neutrophils Relative %: 60 %
PLATELETS: 124 10*3/uL — AB (ref 150–440)
RBC: 4.58 MIL/uL (ref 4.40–5.90)
RDW: 13.3 % (ref 11.5–14.5)
WBC: 15.6 10*3/uL — AB (ref 3.8–10.6)

## 2015-08-24 LAB — URINALYSIS COMPLETE WITH MICROSCOPIC (ARMC ONLY)
BILIRUBIN URINE: NEGATIVE
Bacteria, UA: NONE SEEN
Glucose, UA: >500 mg/dL — AB
LEUKOCYTES UA: NEGATIVE
Nitrite: NEGATIVE
PH: 5 (ref 5.0–8.0)
Protein, ur: 100 mg/dL — AB
SPECIFIC GRAVITY, URINE: 1.021 (ref 1.005–1.030)

## 2015-08-24 LAB — URINE DRUG SCREEN, QUALITATIVE (ARMC ONLY)
AMPHETAMINES, UR SCREEN: NOT DETECTED
BARBITURATES, UR SCREEN: NOT DETECTED
BENZODIAZEPINE, UR SCRN: NOT DETECTED
Cannabinoid 50 Ng, Ur ~~LOC~~: NOT DETECTED
Cocaine Metabolite,Ur ~~LOC~~: NOT DETECTED
MDMA (Ecstasy)Ur Screen: NOT DETECTED
Methadone Scn, Ur: NOT DETECTED
Opiate, Ur Screen: NOT DETECTED
PHENCYCLIDINE (PCP) UR S: NOT DETECTED
Tricyclic, Ur Screen: POSITIVE — AB

## 2015-08-24 LAB — ETHANOL

## 2015-08-24 LAB — CK: CK TOTAL: 1405 U/L — AB (ref 49–397)

## 2015-08-24 LAB — TROPONIN I
TROPONIN I: 0.53 ng/mL — AB (ref <0.031)
TROPONIN I: 0.92 ng/mL — AB (ref <0.031)

## 2015-08-24 MED ORDER — BUSPIRONE HCL 5 MG PO TABS
15.0000 mg | ORAL_TABLET | Freq: Three times a day (TID) | ORAL | Status: DC
  Administered 2015-08-24 – 2015-08-26 (×5): 15 mg via ORAL
  Filled 2015-08-24 (×5): qty 3

## 2015-08-24 MED ORDER — ALUM & MAG HYDROXIDE-SIMETH 200-200-20 MG/5ML PO SUSP: 30.0000 mL | Freq: Four times a day (QID) | ORAL | Status: DC | PRN

## 2015-08-24 MED ORDER — INSULIN ASPART 100 UNIT/ML ~~LOC~~ SOLN: 0.0000 [IU] | Freq: Every day | SUBCUTANEOUS | Status: DC

## 2015-08-24 MED ORDER — SODIUM CHLORIDE 0.9% FLUSH
3.0000 mL | Freq: Two times a day (BID) | INTRAVENOUS | Status: DC
  Administered 2015-08-26: 3 mL via INTRAVENOUS

## 2015-08-24 MED ORDER — OCUVITE-LUTEIN PO CAPS
1.0000 | ORAL_CAPSULE | Freq: Two times a day (BID) | ORAL | Status: DC
  Administered 2015-08-24 – 2015-08-26 (×4): 1 via ORAL
  Filled 2015-08-24 (×8): qty 1

## 2015-08-24 MED ORDER — LOSARTAN POTASSIUM 50 MG PO TABS
50.0000 mg | ORAL_TABLET | Freq: Two times a day (BID) | ORAL | Status: DC
  Administered 2015-08-24 – 2015-08-26 (×4): 50 mg via ORAL
  Filled 2015-08-24 (×4): qty 1

## 2015-08-24 MED ORDER — ONDANSETRON HCL 4 MG/2ML IJ SOLN: 4.0000 mg | Freq: Four times a day (QID) | INTRAMUSCULAR | Status: DC | PRN

## 2015-08-24 MED ORDER — INSULIN ASPART 100 UNIT/ML ~~LOC~~ SOLN
0.0000 [IU] | Freq: Three times a day (TID) | SUBCUTANEOUS | Status: DC
  Administered 2015-08-25: 2 [IU] via SUBCUTANEOUS
  Administered 2015-08-25: 3 [IU] via SUBCUTANEOUS
  Administered 2015-08-26 (×2): 2 [IU] via SUBCUTANEOUS
  Filled 2015-08-24 (×2): qty 2
  Filled 2015-08-24: qty 3
  Filled 2015-08-24: qty 2

## 2015-08-24 MED ORDER — LATANOPROST 0.005 % OP SOLN
1.0000 [drp] | Freq: Every day | OPHTHALMIC | Status: DC
  Administered 2015-08-24 – 2015-08-25 (×2): 1 [drp] via OPHTHALMIC
  Filled 2015-08-24: qty 2.5

## 2015-08-24 MED ORDER — METOPROLOL TARTRATE 25 MG PO TABS
25.0000 mg | ORAL_TABLET | Freq: Two times a day (BID) | ORAL | Status: DC
  Administered 2015-08-25 – 2015-08-26 (×3): 25 mg via ORAL
  Filled 2015-08-24 (×5): qty 1

## 2015-08-24 MED ORDER — DORZOLAMIDE HCL-TIMOLOL MAL 2-0.5 % OP SOLN
1.0000 [drp] | Freq: Two times a day (BID) | OPHTHALMIC | Status: DC
  Administered 2015-08-25: 1 [drp] via OPHTHALMIC
  Filled 2015-08-24 (×2): qty 10

## 2015-08-24 MED ORDER — IPRATROPIUM-ALBUTEROL 0.5-2.5 (3) MG/3ML IN SOLN
3.0000 mL | RESPIRATORY_TRACT | Status: DC
  Administered 2015-08-24 – 2015-08-25 (×6): 3 mL via RESPIRATORY_TRACT
  Filled 2015-08-24 (×5): qty 3

## 2015-08-24 MED ORDER — METFORMIN HCL 500 MG PO TABS: 1000.0000 mg | ORAL_TABLET | Freq: Two times a day (BID) | ORAL | Status: DC

## 2015-08-24 MED ORDER — SENNOSIDES-DOCUSATE SODIUM 8.6-50 MG PO TABS: 1.0000 | ORAL_TABLET | Freq: Every evening | ORAL | Status: DC | PRN

## 2015-08-24 MED ORDER — CLONIDINE HCL 0.1 MG PO TABS
0.1000 mg | ORAL_TABLET | Freq: Two times a day (BID) | ORAL | Status: DC
  Administered 2015-08-24 – 2015-08-26 (×4): 0.1 mg via ORAL
  Filled 2015-08-24 (×4): qty 1

## 2015-08-24 MED ORDER — GLIPIZIDE 5 MG PO TABS: 5.0000 mg | ORAL_TABLET | Freq: Two times a day (BID) | ORAL | Status: DC

## 2015-08-24 MED ORDER — ATORVASTATIN CALCIUM 20 MG PO TABS
20.0000 mg | ORAL_TABLET | Freq: Every day | ORAL | Status: DC
  Administered 2015-08-25 – 2015-08-26 (×2): 20 mg via ORAL
  Filled 2015-08-24 (×2): qty 1

## 2015-08-24 MED ORDER — VITAMIN D 1000 UNITS PO TABS
2000.0000 [IU] | ORAL_TABLET | Freq: Every day | ORAL | Status: DC
  Administered 2015-08-25 – 2015-08-26 (×2): 2000 [IU] via ORAL
  Filled 2015-08-24 (×2): qty 2

## 2015-08-24 MED ORDER — ENOXAPARIN SODIUM 40 MG/0.4ML ~~LOC~~ SOLN
40.0000 mg | SUBCUTANEOUS | Status: DC
  Administered 2015-08-24 – 2015-08-25 (×2): 40 mg via SUBCUTANEOUS
  Filled 2015-08-24 (×2): qty 0.4

## 2015-08-24 MED ORDER — ONDANSETRON HCL 4 MG PO TABS: 4.0000 mg | ORAL_TABLET | Freq: Four times a day (QID) | ORAL | Status: DC | PRN

## 2015-08-24 MED ORDER — LEVOTHYROXINE SODIUM 75 MCG PO TABS
75.0000 ug | ORAL_TABLET | Freq: Every day | ORAL | Status: DC
  Administered 2015-08-25 – 2015-08-26 (×2): 75 ug via ORAL
  Filled 2015-08-24 (×2): qty 1

## 2015-08-24 MED ORDER — SODIUM CHLORIDE 0.9 % IV SOLN
INTRAVENOUS | Status: DC
  Administered 2015-08-24 – 2015-08-25 (×3): via INTRAVENOUS

## 2015-08-24 MED ORDER — CLOPIDOGREL BISULFATE 75 MG PO TABS
75.0000 mg | ORAL_TABLET | Freq: Every day | ORAL | Status: DC
  Administered 2015-08-24 – 2015-08-25 (×2): 75 mg via ORAL
  Filled 2015-08-24 (×2): qty 1

## 2015-08-24 MED ORDER — TAMSULOSIN HCL 0.4 MG PO CAPS
0.4000 mg | ORAL_CAPSULE | Freq: Every day | ORAL | Status: DC
  Administered 2015-08-24 – 2015-08-25 (×2): 0.4 mg via ORAL
  Filled 2015-08-24 (×2): qty 1

## 2015-08-24 MED ORDER — AMITRIPTYLINE HCL 25 MG PO TABS
25.0000 mg | ORAL_TABLET | Freq: Every day | ORAL | Status: DC
  Administered 2015-08-24 – 2015-08-25 (×2): 25 mg via ORAL
  Filled 2015-08-24 (×2): qty 1

## 2015-08-24 MED ORDER — VITAMIN B-12 1000 MCG PO TABS
1000.0000 ug | ORAL_TABLET | Freq: Every day | ORAL | Status: DC
  Administered 2015-08-24 – 2015-08-25 (×2): 1000 ug via ORAL
  Filled 2015-08-24 (×2): qty 1

## 2015-08-24 MED ORDER — ASPIRIN EC 81 MG PO TBEC
81.0000 mg | DELAYED_RELEASE_TABLET | Freq: Every day | ORAL | Status: DC
  Administered 2015-08-25 – 2015-08-26 (×2): 81 mg via ORAL
  Filled 2015-08-24 (×2): qty 1

## 2015-08-24 MED ORDER — BRIMONIDINE TARTRATE 0.2 % OP SOLN
1.0000 [drp] | Freq: Two times a day (BID) | OPHTHALMIC | Status: DC
  Administered 2015-08-25: 1 [drp] via OPHTHALMIC
  Filled 2015-08-24 (×2): qty 5

## 2015-08-24 MED ORDER — FINASTERIDE 5 MG PO TABS
5.0000 mg | ORAL_TABLET | Freq: Every day | ORAL | Status: DC
  Administered 2015-08-25 – 2015-08-26 (×2): 5 mg via ORAL
  Filled 2015-08-24 (×2): qty 1

## 2015-08-24 MED ORDER — QUETIAPINE FUMARATE 25 MG PO TABS: 25.0000 mg | ORAL_TABLET | Freq: Every evening | ORAL | Status: DC | PRN

## 2015-08-24 MED ORDER — ASPIRIN 81 MG PO CHEW
324.0000 mg | CHEWABLE_TABLET | Freq: Once | ORAL | Status: AC
  Administered 2015-08-24: 243 mg via ORAL
  Filled 2015-08-24: qty 3

## 2015-08-24 MED ORDER — NYSTATIN 100000 UNIT/GM EX POWD
1.0000 g | Freq: Two times a day (BID) | CUTANEOUS | Status: DC | PRN
Start: 2015-08-24 — End: 2015-08-26
  Administered 2015-08-26: 1 g via TOPICAL
  Filled 2015-08-24: qty 60

## 2015-08-24 NOTE — H&P (Addendum)
[facility] NAME: Chad Rollins    MR#:  [id number]  DATE OF BIRTH:  03/11/1936  DATE OF ADMISSION:  08/24/2015  PRIMARY CARE PHYSICIAN: SPARKS,JEFFREY D, MD   REQUESTING/REFERRING PHYSICIAN:  Dr. Reita Cliche  CHIEF COMPLAINT:  Fall HISTORY OF PRESENT ILLNESS:  Chad Rollins  is a 80 y.o. male with a known history of stroke, diabetes and hypothyroidism who presents to above complaint. Patient reports that around midnight he was trying to use the restroom when he fell to the floor and was unable to get himself up. His daughter found him at approximately 11:00. He has no neurological deficits. He denies symptoms such as dizziness or chest pain prior to the fall. He describes as follows mechanical fall. At baseline patient is not very ambulatory. He does walk with a cane when he does ambulate.  PAST MEDICAL HISTORY:   Past Medical History  Diagnosis Date  . Rheumatoid arthritis ([place])   . Stroke ([place])   . Peripheral neuropathy ([place])   . Diabetes mellitus ([place])   . HLD (hyperlipidemia)   . Allergic rhinitis   . Aortic stenosis   . Hypothyroidism   . Injury of right rotator cuff   . Osteoarthritis   . Obesity   . Continuous right lower quadrant pain   . BPH with obstruction/lower urinary tract symptoms   . HTN (hypertension)     PAST SURGICAL HISTORY:   Past Surgical History  Procedure Laterality Date  . Cholecystectomy    . Surgery for sleep apnea    . Lipoma excisions    . Tonsillectomy      SOCIAL HISTORY:   Social History  Substance Use Topics  . Smoking status: Former Research scientist (life sciences)  . Smokeless tobacco: No     Comment: quit 1975  . Alcohol Use: No    FAMILY HISTORY:   Family History  Problem Relation Age of Onset  . Prostate cancer Neg Hx   . Bladder Cancer Neg Hx   . Kidney disease Neg Hx     DRUG ALLERGIES:   Allergies  Allergen Reactions  . Tylenol [Acetaminophen] Other (See Comments)    Reaction:   Unknown      REVIEW OF SYSTEMS:  CONSTITUTIONAL: No fever, fatigue ositos for generalized weakness.  EYES: No blurred or double vision.  EARS, NOSE, AND THROAT: No tinnitus or ear pain.  RESPIRATORY: No cough, shortness of breath, wheezing or hemoptysis.  CARDIOVASCULAR: No chest pain, orthopnea, edema.  GASTROINTESTINAL: No nausea, vomiting, diarrhea or abdominal pain.  GENITOURINARY: No dysuria, hematuria.  ENDOCRINE: No polyuria, nocturia,  HEMATOLOGY: No anemia, easy bruising or bleeding SKIN: No rash or lesion. MUSCULOSKELETAL: No joint pain or arthritis.  Generalized weakness with falls NEUROLOGIC: No tingling, numbness, weakness.  PSYCHIATRY: No anxiety or depression.   MEDICATIONS AT HOME:   Prior to Admission medications   Medication Sig Start Date End Date Taking? Authorizing Provider  amitriptyline (ELAVIL) 25 MG tablet Take 25 mg by mouth at bedtime.   Yes Historical Provider, MD  aspirin EC 81 MG tablet Take 81 mg by mouth daily.   Yes Historical Provider, MD  atorvastatin (LIPITOR) 20 MG tablet Take 20 mg by mouth daily.    Yes Historical Provider, MD  brimonidine (ALPHAGAN) 0.2 % ophthalmic solution Place 1 drop into the left eye 2 (two) times daily.   Yes Historical Provider, MD  busPIRone (BUSPAR) 15 MG tablet Take 15 mg by mouth 3 (three)  times daily.    Yes Historical Provider, MD  cholecalciferol (VITAMIN D) 1000 units tablet Take 2,000 Units by mouth daily.   Yes Historical Provider, MD  cloNIDine (CATAPRES) 0.1 MG tablet Take 0.1 mg by mouth 2 (two) times daily.   Yes Historical Provider, MD  clopidogrel (PLAVIX) 75 MG tablet Take 75 mg by mouth at bedtime.    Yes Historical Provider, MD  dorzolamide-timolol (COSOPT) 22.3-6.8 MG/ML ophthalmic solution Place 1 drop into both eyes 2 (two) times daily.   Yes Historical Provider, MD  finasteride (PROSCAR) 5 MG tablet Take 1 tablet (5 mg total) by mouth daily. 05/06/15  Yes Shannon A McGowan, PA-C  glipiZIDE  (GLUCOTROL) 5 MG tablet Take 5 mg by mouth 2 (two) times daily before a meal.    Yes Historical Provider, MD  latanoprost (XALATAN) 0.005 % ophthalmic solution Place 1 drop into both eyes at bedtime.    Yes Historical Provider, MD  levothyroxine (SYNTHROID, LEVOTHROID) 75 MCG tablet Take 75 mcg by mouth daily before breakfast.   Yes Historical Provider, MD  losartan (COZAAR) 50 MG tablet Take 50 mg by mouth 2 (two) times daily.    Yes Historical Provider, MD  metFORMIN (GLUCOPHAGE) 1000 MG tablet Take 1,000 mg by mouth 2 (two) times daily with a meal.   Yes Historical Provider, MD  metoprolol tartrate (LOPRESSOR) 25 MG tablet Take 25 mg by mouth 2 (two) times daily.    Yes Historical Provider, MD  Multiple Vitamins-Minerals (PRESERVISION AREDS 2) CAPS Take 1 capsule by mouth 2 (two) times daily.   Yes Historical Provider, MD  nystatin (MYCOSTATIN) powder Apply 1 g topically 2 (two) times daily as needed (for rash).    Yes Historical Provider, MD  QUEtiapine (SEROQUEL) 25 MG tablet Take 25-50 mg by mouth at bedtime as needed (for sleep).   Yes Historical Provider, MD  tamsulosin (FLOMAX) 0.4 MG CAPS capsule Take 0.4 mg by mouth daily after supper.  07/14/14  Yes Historical Provider, MD  vitamin B-12 (CYANOCOBALAMIN) 1000 MCG tablet Take 1,000 mcg by mouth at bedtime.   Yes Historical Provider, MD      VITAL SIGNS:  Blood pressure 138/69, pulse 92, temperature 97.8 F (36.6 C), temperature source Oral, weight 92 kg (202 lb 13.2 oz), SpO2 97 %.  PHYSICAL EXAMINATION:  GENERAL:  80 y.o.-year-old patient lying in the bed with no acute distress.  EYES: Pupils equal, round, reactive to light and accommodation. No scleral icterus. Extraocular muscles intact.  HEENT: Head atraumatic, normocephalic. Oropharynx and nasopharynx clear.  NECK:  Supple, no jugular venous distention. No thyroid enlargement, no tenderness.  LUNGS: Normal breath sounds bilaterally, no wheezing, rales,rhonchi or crepitation. No  use of accessory muscles of respiration.  CARDIOVASCULAR: S1, S2 normal. 3/6 holosystolic ejection murmur heard best at the right sternal border, rubs, or gallops.  ABDOMEN: Soft, nontender, nondistended. Bowel sounds present. No organomegaly or mass.  EXTREMITIES: No pedal edema, cyanosis, or clubbing.  NEUROLOGIC: Cranial nerves II through XII are grossly intact. No focal deficits. PSYCHIATRIC: The patient is alert and oriented x 3.  SKIN: No obvious rash, lesion, or ulcer.   LABORATORY PANEL:   CBC  Recent Labs Lab 08/24/15 1407  WBC 15.6*  HGB 14.0  HCT 41.7  PLT 124*   ------------------------------------------------------------------------------------------------------------------  Chemistries   Recent Labs Lab 08/24/15 1407  NA 141  K 4.0  CL 107  CO2 25  GLUCOSE 241*  BUN 26*  CREATININE 1.25*  CALCIUM 8.9  AST 35  ALT 18  ALKPHOS 72  BILITOT 1.3*   ------------------------------------------------------------------------------------------------------------------  Cardiac Enzymes  Recent Labs Lab 08/24/15 1407  TROPONINI 0.53*   ------------------------------------------------------------------------------------------------------------------  RADIOLOGY:  Ct Head Wo Contrast  08/24/2015  CLINICAL DATA:  80 year old who fell last night while trying to turn on a lamp at home. Patient was unable to get up and was found on the floor by his daughter at 10:45 a.m. today. Patient denies loss consciousness. EXAM: CT HEAD WITHOUT CONTRAST TECHNIQUE: Contiguous axial images were obtained from the base of the skull through the vertex without intravenous contrast. COMPARISON:  CT head 08/07/2014 and earlier.  MRI brain 08/07/2014. FINDINGS: Moderate cortical and deep atrophy, slightly progressive since 2012. Severe changes of small vessel disease of the white matter diffusely, also progressive. Old lacunar strokes in the basal ganglia bilaterally as noted on prior MR.  No mass lesion. No midline shift. No acute hemorrhage or hematoma. No extra-axial fluid collections. No evidence of acute infarction. No skull fracture or other focal osseous abnormality involving the skull. 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 Visualized paranasal sinuses, bilateral mastoid air cells and bilateral middle ear cavities well-aerated. Moderate to severe bilateral carotid siphon and vertebral artery atherosclerosis. Right globe prosthesis. IMPRESSION: 1. No acute intracranial abnormality. 2. Moderate generalized atrophy and severe chronic microvascular ischemic changes of the white matter diffusely. 3. Old lacunar strokes in the basal ganglia bilaterally. Electronically Signed   By: Evangeline Dakin M.D.   On: 08/24/2015 15:06   Dg Chest Port 1 View  08/24/2015  CLINICAL DATA:  80 year old who fell last night while trying to turn on a lamp at home, unable to get up after the fall. Patient found in the floor this morning about 10:45 a.m. Current history of diabetes hypertension. EXAM: PORTABLE CHEST 1 VIEW COMPARISON:  2/4/1,016 and earlier, including CTA chest 9/20 11/1012. FINDINGS: Suboptimal inspiration accounts for crowded bronchovascular markings diffusely and atelectasis in the bases, and accentuates the cardiac silhouette. Taking this into account, cardiac silhouette moderately enlarged, unchanged. Lungs otherwise clear. No localized airspace consolidation. No pleural effusions. No pneumothorax. Normal pulmonary vascularity. IMPRESSION: Suboptimal inspiration accounts for bibasilar atelectasis. No acute cardiopulmonary disease otherwise. Stable cardiomegaly. Electronically Signed   By: Evangeline Dakin M.D.   On: 08/24/2015 15:15    EKG:   Normal sinus rhythm no ST elevation or depression  IMPRESSION AND PLAN:   This is 80 year old male with a history of heart murmur and CVA who presents after mechanical fall and was found several hours after his fall  by his daughter.  1. Elevated troponin: I do not suspect this is ACS but rather demand ischemia.  Continue telemetry monitoring for arrhythmia and continue to monitor troponins.  2. Mechanical fall: We'll check CPK to evaluate for rhabdomyolysis. Patient needs physical therapy consultation and likely may need placement and therefore clinical social worker consult has also been placed.  3. Type 2 diabetes without complication: Continue sliding scale insulin, ADA diet and outpatient medications including glipizide and  Metformin. Check hemoglobin A1c.  4. Essential hypertension: Continue metoprolol, clonidine, losartan.  5. History of CVA: Continue Plavix and atorvastatin. No indication that his fall was due to a neurological process. 6. hypothyroid: Continue Synthroid.  7. Heart murmur: This is known as per patient family and patient. His cardiologist is Dr. Ubaldo Glassing  8. Leukocytosis: I suspect this is due to his fall. There is no source of infection on urine and her chest x-ray.  All the records  are reviewed and case discussed with ED provider. Management plans discussed with the patient and he is in agreement. Discussed with daughter at bedside CODE STATUS: FULL  TOTAL TIME TAKING CARE OF THIS PATIENT: 50 minutes.    Makira Holleman M.D on 08/24/2015 at 4:54 PM  Between 7am to 6pm - Pager - [phone] After 6pm go to [email or web address] - password [email or web address] [facility]  Office  [phone]  CC: Primary care physician; Idelle Crouch, MD

## 2015-08-24 NOTE — Clinical Social Work Note (Signed)
Clinical Social Work Assessment  Patient Details  Name: Chad Rollins MRN: [id number] Date of Birth: [birth year]  Date of referral:  08/24/15               Reason for consult:  Other (Comment Required) (medical concerns)                Permission sought to share information with:  Family Supports, Customer service manager Permission granted to share information::  Yes, Verbal Permission Granted  Name::     Primus Bravo [id number] Daughter HCPOA  Agency::  Yes  Relationship::  Yes  Contact Information:  Yes  Housing/Transportation Living arrangements for the past 2 months:  [place] of Information:  Patient, Adult Children Patient Interpreter Needed:  None Criminal Activity/Legal Involvement Pertinent to Current Situation/Hospitalization:  No - Comment as needed Significant Relationships:  Adult Children, Community Support Lives with:  Self Do you feel safe going back to the place where you live?  Yes Need for family participation in patient care:  Yes (Comment)  Care giving concerns:  Feels dads mobility has slowed somewhat- would benefit from PT   Social Worker assessment / plan:  Complete assessment and Fl2 started in case he is placed for rehab  Employment status:  Retired Insurance underwriter information:  Information systems manager, Other (Comment Required) ([employer]) PT Recommendations:  Not assessed at this time Information / Referral to community resources:  Other (Comment Required) (Rehab PT at ALF/SNF)  Patient/Family's Response to care: They are grateful he will be looked after in hospital and resources for additional support will be provided  Patient/Family's Understanding of and Emotional Response to Diagnosis, Current Treatment, and Prognosis:  Daughter understands Dad may need some additional home health or rehabilitation and support.  Emotional Assessment Appearance:  Appears stated age Attitude/Demeanor/Rapport:   (Polite, calm, able to speak  well) Affect (typically observed):  Accepting, Adaptable, Happy, Hopeful Orientation:  Oriented to Self, Oriented to Place, Oriented to  Time, Oriented to Situation Alcohol / Substance use:  Never Used Psych involvement (Current and /or in the community):  No (Comment)  Discharge Needs  Concerns to be addressed:  No discharge needs identified Readmission within the last 30 days:  No Current discharge risk:  Lives alone Barriers to Discharge:  No Barriers Identified   Joana Reamer, LCSW 08/24/2015, 5:48 PM

## 2015-08-24 NOTE — Progress Notes (Signed)
LCSW met with patient and his daughter Renford Dills  [phone]. He will be admitted. His daughter reports he has a homemaker who comes and assists him at home with meals, cleaning and companionship 3 days a week 12 hours.  Patient is cognitively clear, he was mobile with a cane up to  Less than 3 days ago but is struggling to stand up. LCSW hopes PT will be ordered for this patient.   LCSW completed assessment- started [facility] and provided family with resources for community and ALF/SNF list.   Enis Slipper LCSW [phone]

## 2015-08-24 NOTE — Progress Notes (Signed)
MD. Dr Jannifer Franklin notified of second troponin.  No new orders- wait for the third [place] Avers

## 2015-08-24 NOTE — ED Provider Notes (Signed)
[facility] Emergency Department Provider Note   ____________________________________________  Time seen: Approximately 2:30 PM I have reviewed the triage vital signs and the triage nursing note.  HISTORY  Chief Complaint Fall   Historian Patient and daughter  HPI Chad Rollins is a 80 y.o. male who lives at home alone but has someone come and help 3 times a week, reportedly fell at home last night. He normally has a cane but states he is not using it and tried to turn lamp and lost his balance and fell backward. Unclear whether or not he hit his head. He doesn't think he lost consciousness. He was too weak to get up on his own and so he laid in the floor overnight. He probably was on the floor over 16 hours. His daughter found him this morning on the floor. There was a strong smell of urine on his clothes. Symptoms are moderate. No headache now. No neck pain. Mild confusion and generalized weakness. Question about whether or not he is newly off balance.    Past Medical History  Diagnosis Date  . Rheumatoid arthritis ([place])   . Stroke ([place])   . Peripheral neuropathy ([place])   . Diabetes mellitus ([place])   . HLD (hyperlipidemia)   . Allergic rhinitis   . Aortic stenosis   . Hypothyroidism   . Injury of right rotator cuff   . Osteoarthritis   . Obesity   . Continuous right lower quadrant pain   . BPH with obstruction/lower urinary tract symptoms   . HTN (hypertension)     Patient Active Problem List   Diagnosis Date Noted  . BPH with obstruction/lower urinary tract symptoms 01/21/2015  . Right lower quadrant abdominal pain 01/21/2015    Past Surgical History  Procedure Laterality Date  . Cholecystectomy    . Surgery for sleep apnea    . Lipoma excisions    . Tonsillectomy      Current Outpatient Rx  Name  Route  Sig  Dispense  Refill  . AGGRENOX 25-200 MG per 12 hr capsule            4     Dispense as written.   Marland Kitchen aspirin EC 81 MG  tablet   Oral   Take 81 mg by mouth daily.         Marland Kitchen atorvastatin (LIPITOR) 20 MG tablet   Oral   Take by mouth.         . AZELASTINE-FLUTICASONE NA   Nasal   Place into the nose as needed.         . brimonidine (ALPHAGAN P) 0.1 % SOLN               . busPIRone (BUSPAR) 15 MG tablet   Oral   Take by mouth.         . carboxymethylcellulose (REFRESH PLUS) 0.5 % SOLN   Ophthalmic   Apply to eye.         . Cholecalciferol (VITAMIN D3) 2000 UNITS capsule   Oral   Take by mouth.         . EXPIRED: cloNIDine (CATAPRES) 0.1 MG tablet   Oral   Take by mouth.         . clopidogrel (PLAVIX) 75 MG tablet   Oral   Take 75 mg by mouth daily.         . Cyanocobalamin (RA VITAMIN B-12 TR) 1000 MCG TBCR   Oral   Take  by mouth.         . dorzolamide (TRUSOPT) 2 % ophthalmic solution   Ophthalmic   Apply to eye.         . finasteride (PROSCAR) 5 MG tablet   Oral   Take 1 tablet (5 mg total) by mouth daily.   30 tablet   6   . finasteride (PROSCAR) 5 MG tablet   Oral   Take 1 tablet (5 mg total) by mouth daily.   30 tablet   3   . finasteride (PROSCAR) 5 MG tablet   Oral   Take 1 tablet (5 mg total) by mouth daily.   30 tablet   3   . fluocinonide cream (LIDEX) 0.05 %               . glipiZIDE (GLUCOTROL) 5 MG tablet   Oral   Take 5 mg by mouth daily before breakfast.         . HYDROcodone-acetaminophen (NORCO/VICODIN) 5-325 MG per tablet   Oral   Take by mouth.         . latanoprost (XALATAN) 0.005 % ophthalmic solution   Ophthalmic   Apply to eye.         . levothyroxine (SYNTHROID, LEVOTHROID) 75 MCG tablet   Oral   Take 75 mcg by mouth daily before breakfast.         . losartan (COZAAR) 50 MG tablet   Oral   Take by mouth.         . metFORMIN (GLUCOPHAGE) 1000 MG tablet   Oral   Take 1,000 mg by mouth 2 (two) times daily with a meal.         . methocarbamol (ROBAXIN) 500 MG tablet            4   .  metoprolol tartrate (LOPRESSOR) 25 MG tablet   Oral   Take by mouth.         . naproxen sodium (ANAPROX) 220 MG tablet   Oral   Take 220 mg by mouth 2 (two) times daily with a meal.         . nystatin (MYCOSTATIN) powder               . prednisoLONE acetate (PRED FORTE) 1 % ophthalmic suspension   Ophthalmic   Apply to eye.         . sodium chloride (MURO 128) 5 % ophthalmic solution   Ophthalmic   Apply to eye.         . tamsulosin (FLOMAX) 0.4 MG CAPS capsule   Oral   Take by mouth.         . timolol (TIMOPTIC) 0.5 % ophthalmic solution   Ophthalmic   Apply to eye.         . zolpidem (AMBIEN) 10 MG tablet   Oral   Take 10 mg by mouth at bedtime as needed for sleep.           Allergies Tylenol  Family History  Problem Relation Age of Onset  . Prostate cancer Neg Hx   . Bladder Cancer Neg Hx   . Kidney disease Neg Hx     Social History Social History  Substance Use Topics  . Smoking status: Former Research scientist (life sciences)  . Smokeless tobacco: None     Comment: quit 1975  . Alcohol Use: No    Review of Systems  Constitutional: Negative for fever. Eyes: Negative for visual changes. ENT: Negative  for sore throat. Cardiovascular: Negative for chest pain. Respiratory: Positive for cough, nonproductive. Gastrointestinal: Negative for abdominal pain, vomiting and diarrhea. Genitourinary: Negative for dysuria. Musculoskeletal: Negative for back pain, or extremity trauma. Skin: Negative for rash. Neurological: Negative for headache. 10 point Review of Systems otherwise negative ____________________________________________   PHYSICAL EXAM:  VITAL SIGNS: ED Triage Vitals  Enc Vitals Group     BP 08/24/15 1338 138/69 mmHg     Pulse Rate 08/24/15 1338 92     Resp --      Temp 08/24/15 1338 97.8 F (36.6 C)     Temp Source 08/24/15 1338 Oral     SpO2 08/24/15 1338 97 %     Weight 08/24/15 1338 202 lb 13.2 oz (92 kg)     Height --      Head Cir --       Peak Flow --      Pain Score --      Pain Loc --      Pain Edu? --      Excl. in [place]? --      Constitutional: Alert and cooperative. Well appearing and overall and in no distress. HEENT   Head: Normocephalic and atraumatic.      Eyes: Conjunctivae are normal. PERRL. Normal extraocular movements.      Ears:         Nose: No congestion/rhinnorhea.   Mouth/Throat: Mucous membranes are moist.   Neck: No stridor. Cardiovascular/Chest: Normal rate, regular rhythm.  No murmurs, rubs, or gallops. Respiratory: Normal respiratory effort without tachypnea nor retractions. Breath sounds are clear and equal bilaterally. No wheezes. Mild rhonchi posteriorly both sides. Gastrointestinal: Soft. No distention, no guarding, no rebound. Nontender.   Genitourinary/rectal:Deferred Musculoskeletal: Pelvis stable. Nontender with normal range of motion in all extremities. No joint effusions.  No lower extremity tenderness. Trace edema bilateral lower extremity. Neurologic:  Normal speech and language. No gross or focal neurologic deficits are appreciated. Somewhat slow to answer questions. Skin:  Skin is warm, dry and intact. No rash noted. Psychiatric: Mood and affect are normal. Speech and behavior are normal. Patient exhibits appropriate insight and judgment.  ____________________________________________   EKG I, Lisa Roca, MD, the attending physician have personally viewed and interpreted all ECGs.  88 bpm. Normal sinus rhythm. Narrow QRS. Normal axis. Nonspecific T-wave ____________________________________________  LABS (pertinent positives/negatives)  Lactic acid pending  Urinalysis negative for signs of infection. 1+ ketones Urine drug screen positive for TCA [facility] metabolic panel significant for BUN 26 and cranny 125 and troponin 0.53  ____________________________________________  RADIOLOGY All Xrays were viewed by me. Imaging interpreted by Radiologist.  Chest  x-ray one view portable: IMPRESSION: Suboptimal inspiration accounts for bibasilar atelectasis. No acute cardiopulmonary disease otherwise. Stable cardiomegaly.  CT head noncontrast: IMPRESSION: 1. No acute intracranial abnormality. 2. Moderate generalized atrophy and severe chronic microvascular ischemic changes of the white matter diffusely. 3. Old lacunar strokes in the basal ganglia bilaterally. __________________________________________  PROCEDURES  Procedure(s) performed: None  Critical Care performed: None  ____________________________________________   ED COURSE / ASSESSMENT AND PLAN  Pertinent labs & imaging results that were available during my care of the patient were reviewed by me and considered in my medical decision making (see chart for details).   Patient arrived from home after a witnessed fall. He seems a little slow to answer questions, although he has no focal deficits, he does seem off balance when trying to stand him up, and I will obtain a head CT.  Unclear whether or not this is stroke-like, traumatic, or simply generalized weakness/deconditioning. Daughter states that home she he sits in his lift chair most of the time.  He is barely able to stand on his own and he certainly cannot walk he falls to the side. He is unable to go home on his own. He is going to need PTevaluation/acute care rehabilitation stay even if he does not need hospitalization criteria.  ----------------------------------------- 4:03 PM on 08/24/2015 -----------------------------------------  Troponin 0.5, patient given aspirin. Likely demand ischemia. Patient will be observed in the hospital overnight. Lactate is only lab pending at this point in time.    CONSULTATIONS:   Face-to-face with hospitalist, Dr.Mody for admission   Patient / Family / Caregiver informed of clinical course, medical decision-making process, and agree with  plan.     ___________________________________________   FINAL CLINICAL IMPRESSION(S) / ED DIAGNOSES   Final diagnoses:  Ataxia  Generalized weakness  Elevated troponin              Note: This dictation was prepared with Dragon dictation. Any transcriptional errors that result from this process are unintentional   Lisa Roca, MD 08/24/15 [phone]

## 2015-08-24 NOTE — ED Notes (Signed)
Ems from home s/p fall last nite. Pt stated that he was trying to turn a lamp on, fell and could not get up or reach cell phone. Daughter found pt in floor this am @ 1045. Pt with no c/o pain. Ems walked pt to stretcher at home. Ems stated strong urine smell @ home. Hx DM, HTN. Took AM meds.

## 2015-08-24 NOTE — NC FL2 (Deleted)
[place] LEVEL OF CARE SCREENING TOOL     IDENTIFICATION  Patient Name: Chad Rollins Birthdate: Mar 18, 1936 Sex: male Admission Date (Current Location): 08/24/2015  [place] and [place] Number:  Engineering geologist and Address:  [facility], [address], [place], [place] [address]      Provider Number: [id number]  Attending Physician Name and Address:  Bettey Costa, MD  Relative Name and Phone Number:       Current Level of Care: Hospital Recommended Level of Care: [place], [place] Prior Approval Number:    Date Approved/Denied:   PASRR Number:    Discharge Plan: SNF    Current Diagnoses: Patient Active Problem List   Diagnosis Date Noted  . Elevated troponin 08/24/2015  . BPH with obstruction/lower urinary tract symptoms 01/21/2015  . Right lower quadrant abdominal pain 01/21/2015    Orientation RESPIRATION BLADDER Height & Weight     Self, Time, Situation, Place    Continent Weight: 202 lb 13.2 oz (92 kg) Height:     BEHAVIORAL SYMPTOMS/MOOD NEUROLOGICAL BOWEL NUTRITION STATUS      Continent  (Diabetic)  AMBULATORY STATUS COMMUNICATION OF NEEDS Skin   Extensive Assist Verbally Bruising (Recent bruises on RHS after fall)                       Personal Care Assistance Level of Assistance  Bathing, Feeding, Dressing, Total care Bathing Assistance: Maximum assistance Feeding assistance: Independent Dressing Assistance: Maximum assistance Total Care Assistance: Maximum assistance   Functional Limitations Info  Sight, Hearing, Speech Sight Info: Impaired Hearing Info: Impaired Speech Info: Adequate    SPECIAL CARE FACTORS FREQUENCY                       Contractures Contractures Info: Not present    Additional Factors Info  Allergies   Allergies Info: Tylenol           Current Medications (08/24/2015):  This is the current hospital active medication  list No current facility-administered medications for this encounter.   Current Outpatient Prescriptions  Medication Sig Dispense Refill  . amitriptyline (ELAVIL) 25 MG tablet Take 25 mg by mouth at bedtime.    Marland Kitchen aspirin EC 81 MG tablet Take 81 mg by mouth daily.    Marland Kitchen atorvastatin (LIPITOR) 20 MG tablet Take 20 mg by mouth daily.     . brimonidine (ALPHAGAN) 0.2 % ophthalmic solution Place 1 drop into the left eye 2 (two) times daily.    . busPIRone (BUSPAR) 15 MG tablet Take 15 mg by mouth 3 (three) times daily.     . cholecalciferol (VITAMIN D) 1000 units tablet Take 2,000 Units by mouth daily.    . cloNIDine (CATAPRES) 0.1 MG tablet Take 0.1 mg by mouth 2 (two) times daily.    . clopidogrel (PLAVIX) 75 MG tablet Take 75 mg by mouth at bedtime.     . dorzolamide-timolol (COSOPT) 22.3-6.8 MG/ML ophthalmic solution Place 1 drop into both eyes 2 (two) times daily.    . finasteride (PROSCAR) 5 MG tablet Take 1 tablet (5 mg total) by mouth daily. 30 tablet 3  . glipiZIDE (GLUCOTROL) 5 MG tablet Take 5 mg by mouth 2 (two) times daily before a meal.     . latanoprost (XALATAN) 0.005 % ophthalmic solution Place 1 drop into both eyes at bedtime.     Marland Kitchen levothyroxine (SYNTHROID, LEVOTHROID) 75 MCG tablet Take  75 mcg by mouth daily before breakfast.    . losartan (COZAAR) 50 MG tablet Take 50 mg by mouth 2 (two) times daily.     . metFORMIN (GLUCOPHAGE) 1000 MG tablet Take 1,000 mg by mouth 2 (two) times daily with a meal.    . metoprolol tartrate (LOPRESSOR) 25 MG tablet Take 25 mg by mouth 2 (two) times daily.     . Multiple Vitamins-Minerals (PRESERVISION AREDS 2) CAPS Take 1 capsule by mouth 2 (two) times daily.    Marland Kitchen nystatin (MYCOSTATIN) powder Apply 1 g topically 2 (two) times daily as needed (for rash).     . QUEtiapine (SEROQUEL) 25 MG tablet Take 25-50 mg by mouth at bedtime as needed (for sleep).    . tamsulosin (FLOMAX) 0.4 MG CAPS capsule Take 0.4 mg by mouth daily after supper.     .  vitamin B-12 (CYANOCOBALAMIN) 1000 MCG tablet Take 1,000 mcg by mouth at bedtime.       Discharge Medications: Please see discharge summary for a list of discharge medications.  Relevant Imaging Results:  Relevant Lab Results:   Additional Information SSN [id number]  Joana Reamer, [place]

## 2015-08-24 NOTE — Progress Notes (Signed)
MD. Dr. Marcille Blanco notified due to pt having wheezing. MD placed order for duonebs [place]

## 2015-08-25 LAB — CK: Total CK: 1623 U/L — ABNORMAL HIGH (ref 49–397)

## 2015-08-25 LAB — BASIC METABOLIC PANEL
ANION GAP: 7 (ref 5–15)
BUN: 27 mg/dL — AB (ref 6–20)
CALCIUM: 8.5 mg/dL — AB (ref 8.9–10.3)
CO2: 28 mmol/L (ref 22–32)
Chloride: 110 mmol/L (ref 101–111)
Creatinine, Ser: 1.15 mg/dL (ref 0.61–1.24)
GFR calc Af Amer: >60 mL/min (ref >60)
GFR calc non Af Amer: 59 mL/min — ABNORMAL LOW (ref >60)
GLUCOSE: 100 mg/dL — AB (ref 65–99)
POTASSIUM: 3.6 mmol/L (ref 3.5–5.1)
Sodium: 145 mmol/L (ref 135–145)

## 2015-08-25 LAB — GLUCOSE, CAPILLARY
GLUCOSE-CAPILLARY: 213 mg/dL — AB (ref 65–99)
Glucose-Capillary: 92 mg/dL (ref 65–99)
Glucose-Capillary: 170 mg/dL — ABNORMAL HIGH (ref 65–99)
Glucose-Capillary: 152 mg/dL — ABNORMAL HIGH (ref 65–99)

## 2015-08-25 LAB — HEMOGLOBIN A1C: Hgb A1c MFr Bld: 5.6 % (ref 4.0–6.0)

## 2015-08-25 LAB — CBC
HEMATOCRIT: 40.2 % (ref 40.0–52.0)
HEMOGLOBIN: 13.6 g/dL (ref 13.0–18.0)
MCH: 30.7 pg (ref 26.0–34.0)
MCHC: 33.9 g/dL (ref 32.0–36.0)
MCV: 90.6 fL (ref 80.0–100.0)
Platelets: 111 10*3/uL — ABNORMAL LOW (ref 150–440)
RBC: 4.43 MIL/uL (ref 4.40–5.90)
RDW: 13.1 % (ref 11.5–14.5)
WBC: 11.5 10*3/uL — ABNORMAL HIGH (ref 3.8–10.6)

## 2015-08-25 LAB — TROPONIN I
Troponin I: 0.72 ng/mL — ABNORMAL HIGH (ref <0.031)
Troponin I: 0.4 ng/mL — ABNORMAL HIGH (ref <0.031)

## 2015-08-25 MED ORDER — OXYCODONE HCL 5 MG PO TABS
5.0000 mg | ORAL_TABLET | Freq: Once | ORAL | Status: AC
  Administered 2015-08-25: 5 mg via ORAL
  Filled 2015-08-25: qty 1

## 2015-08-25 MED ORDER — IPRATROPIUM-ALBUTEROL 0.5-2.5 (3) MG/3ML IN SOLN
3.0000 mL | Freq: Four times a day (QID) | RESPIRATORY_TRACT | Status: DC
  Administered 2015-08-26: 3 mL via RESPIRATORY_TRACT
  Filled 2015-08-25: qty 3

## 2015-08-25 MED ORDER — GUAIFENESIN-DM 100-10 MG/5ML PO SYRP
5.0000 mL | ORAL_SOLUTION | ORAL | Status: DC | PRN
  Administered 2015-08-25 – 2015-08-26 (×2): 5 mL via ORAL
  Filled 2015-08-25 (×2): qty 5

## 2015-08-25 NOTE — Progress Notes (Signed)
[place] at [place] NAME: Chad Rollins    MR#:  [id number]  DATE OF BIRTH:  [birth year]  SUBJECTIVE:   Patient doing well this morning.  REVIEW OF SYSTEMS:    Review of Systems  Constitutional: Negative for fever, chills and malaise/fatigue.  HENT: Negative for sore throat.   Eyes: Negative for blurred vision.  Respiratory: Negative for cough, hemoptysis, shortness of breath and wheezing.   Cardiovascular: Negative for chest pain, palpitations and leg swelling.  Gastrointestinal: Negative for nausea, vomiting, abdominal pain, diarrhea and blood in stool.  Genitourinary: Negative for dysuria.  Musculoskeletal: Positive for falls. Negative for back pain.  Neurological: Positive for weakness. Negative for dizziness, tremors and headaches.  Endo/Heme/Allergies: Does not bruise/bleed easily.    Tolerating Diet:yes      DRUG ALLERGIES:   Allergies  Allergen Reactions  . Tylenol [Acetaminophen] Other (See Comments)    Reaction:  Unknown     VITALS:  Blood pressure 115/52, pulse 80, temperature 97.6 F (36.4 C), temperature source Oral, resp. rate 17, height 5\' 9"  (1.753 m), weight 96.843 kg (213 lb 8 oz), SpO2 94 %.  PHYSICAL EXAMINATION:   Physical Exam  Constitutional: He is oriented to person, place, and time and well-developed, well-nourished, and in no distress. No distress.  HENT:  Head: Normocephalic.  Eyes: No scleral icterus.  Neck: Normal range of motion. Neck supple. No JVD present. No tracheal deviation present.  Cardiovascular: Normal rate and regular rhythm.  Exam reveals no gallop and no friction rub.   Murmur heard. Pulmonary/Chest: Effort normal and breath sounds normal. No respiratory distress. He has no wheezes. He has no rales. He exhibits no tenderness.  Abdominal: Soft. Bowel sounds are normal. He exhibits no distension and no mass. There is no tenderness. There is no rebound and no guarding.   Musculoskeletal: Normal range of motion. He exhibits no edema.  Neurological: He is alert and oriented to person, place, and time.  Skin: Skin is warm. No rash noted. No erythema.  Psychiatric: Affect and judgment normal.      LABORATORY PANEL:   CBC  Recent Labs Lab 08/25/15 0328  WBC 11.5*  HGB 13.6  HCT 40.2  PLT 111*   ------------------------------------------------------------------------------------------------------------------  Chemistries   Recent Labs Lab 08/24/15 1407 08/25/15 0328  NA 141 145  K 4.0 3.6  CL 107 110  CO2 25 28  GLUCOSE 241* 100*  BUN 26* 27*  CREATININE 1.25* 1.15  CALCIUM 8.9 8.5*  AST 35  --   ALT 18  --   ALKPHOS 72  --   BILITOT 1.3*  --    ------------------------------------------------------------------------------------------------------------------  Cardiac Enzymes  Recent Labs Lab 08/24/15 2028 08/25/15 0328 08/25/15 1032  TROPONINI 0.92* 0.72* 0.40*   ------------------------------------------------------------------------------------------------------------------  RADIOLOGY:  Ct Head Wo Contrast  08/24/2015  CLINICAL DATA:  80 year old who fell last night while trying to turn on a lamp at home. Patient was unable to get up and was found on the floor by his daughter at 10:45 a.m. today. Patient denies loss consciousness. EXAM: CT HEAD WITHOUT CONTRAST TECHNIQUE: Contiguous axial images were obtained from the base of the skull through the vertex without intravenous contrast. COMPARISON:  CT head 08/07/2014 and earlier.  MRI brain 08/07/2014. FINDINGS: Moderate cortical and deep atrophy, slightly progressive since 2012. Severe changes of small vessel disease of the white matter diffusely, also progressive. Old lacunar strokes in the basal ganglia bilaterally as noted on prior MR.  No mass lesion. No midline shift. No acute hemorrhage or hematoma. No extra-axial fluid collections. No evidence of acute infarction. No skull  fracture or other focal osseous abnormality involving the skull. 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 Visualized paranasal sinuses, bilateral mastoid air cells and bilateral middle ear cavities well-aerated. Moderate to severe bilateral carotid siphon and vertebral artery atherosclerosis. Right globe prosthesis. IMPRESSION: 1. No acute intracranial abnormality. 2. Moderate generalized atrophy and severe chronic microvascular ischemic changes of the white matter diffusely. 3. Old lacunar strokes in the basal ganglia bilaterally. Electronically Signed   By: Evangeline Dakin M.D.   On: 08/24/2015 15:06   Dg Chest Port 1 View  08/24/2015  CLINICAL DATA:  80 year old who fell last night while trying to turn on a lamp at home, unable to get up after the fall. Patient found in the floor this morning about 10:45 a.m. Current history of diabetes hypertension. EXAM: PORTABLE CHEST 1 VIEW COMPARISON:  2/4/1,016 and earlier, including CTA chest 9/20 11/1012. FINDINGS: Suboptimal inspiration accounts for crowded bronchovascular markings diffusely and atelectasis in the bases, and accentuates the cardiac silhouette. Taking this into account, cardiac silhouette moderately enlarged, unchanged. Lungs otherwise clear. No localized airspace consolidation. No pleural effusions. No pneumothorax. Normal pulmonary vascularity. IMPRESSION: Suboptimal inspiration accounts for bibasilar atelectasis. No acute cardiopulmonary disease otherwise. Stable cardiomegaly. Electronically Signed   By: Evangeline Dakin M.D.   On: 08/24/2015 15:15     ASSESSMENT AND PLAN:   This is 80 year old male with a history of heart murmur and CVA who presents after mechanical fall and was found several hours after his fall by his daughter.  1. Elevated troponin: This is from demand ischemia not ACS. No arrhythmia on telemetry.  2. Mechanical fall with rhabdomyolysis: Continue IV fluids and recheck CK.   3. Type 2  diabetes without complication: Continue sliding scale insulin, ADA diet Hemoglobin A1c 5.6  4. Essential hypertension: Continue metoprolol, clonidine, losartan.  5. History of CVA: Continue Plavix and atorvastatin.  6. hypothyroid: Continue Synthroid.  7. Heart murmur: This is known as per patient family and patient. His cardiologist is Dr. Ubaldo Glassing  8. Leukocytosis: This is due to fall and dehydration with her abdomen twice.   Management plans discussed with the patient and he is in agreement.  CODE STATUS: FULL  TOTAL TIME TAKING CARE OF THIS PATIENT: 30 minutes.   Physical therapy consult pending for disposition  POSSIBLE D/C tomorrow, DEPENDING ON CLINICAL CONDITION.   Meya Clutter M.D on 08/25/2015 at 1:55 PM  Between 7am to 6pm - Pager - [phone] After 6pm go to [email or web address] - password [email or web address] [facility]  Office  [phone]  CC: Primary care physician; Idelle Crouch, MD  Note: This dictation was prepared with Dragon dictation along with smaller phrase technology. Any transcriptional errors that result from this process are unintentional.

## 2015-08-25 NOTE — Progress Notes (Signed)
CSW is aware that patient was admitted from the ED due to a recent fall. PT evaluation pending. CSW will consult with PT after evaluation to determine patient's discharge needs. CSW will continue to follow and assist.   Ernest Pine, MSW, [place] Work Department [phone]

## 2015-08-25 NOTE — Care Management (Addendum)
PT evaluation pending

## 2015-08-25 NOTE — Progress Notes (Addendum)
CSW met with patient and his daughter Chad Rollins ( [phone]) at bedside. CSW explained her role. Patient reports that he lives alone but feels that he cannot return in his current condition. He stated that he fell and could not get up. Per patient he worked with PT and felt that he may need more PT at discharge. PT is in agreement for SNF placement. CSW provided SNF list and encouraged patient and his family to research each facility through [email or web address]. CSW also discussed facilities that are in network with his insurance. Gained verbal permission to refer patient to SNFs in [facility]. Preference [facility].   FL2 updated. PASRR submitted and received. Faxed to SNFs in [facility] [place]. Awaiting bed offers.  CSW presented bed offers to patient and his daughter. Accepted bed at [facility]. CSW contacted [name] at [facility] and informed her that patient accepted bed. CSW will continue to follow and assist.   Ernest Pine, MSW, [place] Work Department [phone]

## 2015-08-25 NOTE — Evaluation (Signed)
Physical Therapy Evaluation Patient Details Name: Chad Rollins MRN: [id number] DOB: [birth year] Today's Date: 08/25/2015   History of Present Illness  presented to ER secondary to fall in home environment, unable to get up (on floor for approx 12 hours); admitted with elevated troponin levels (likely demand per notes).  Clinical Impression  Upon evaluation, patient alert and oriented; follows simple commands and demonstrates fair/good insight and safety awareness.  Bilat UE/LEs grossly deconditioned, but functional for basic transfers and mobility.  Currently requiring min/mod assist for bed mobility; min assist for sit/stand, basic transfers and gait (56') with RW.  Generally unsteady (limited ability to respond to external perturbation) and unsafe to complete without +1 assist at this time. Mod SOB with minimal exertion; however, sats remain >95% throughout session.  No reports of pain noted. Would benefit from skilled PT to address above deficits and promote optimal return to PLOF; recommend transition to STR upon discharge from acute hospitalization.     Follow Up Recommendations SNF    Equipment Recommendations       Recommendations for Other Services       Precautions / Restrictions Precautions Precautions: Fall Restrictions Weight Bearing Restrictions: No      Mobility  Bed Mobility Overal bed mobility: Needs Assistance Bed Mobility: Supine to Sit     Supine to sit: Min assist;Mod assist     General bed mobility comments: for truncal elevation with transition through R  Transfers Overall transfer level: Needs assistance Equipment used: Rolling walker (2 wheeled) Transfers: Sit to/from Stand Sit to Stand: Min assist         General transfer comment: requires UE support to complete  Ambulation/Gait Ambulation/Gait assistance: Min assist Ambulation Distance (Feet): 60 Feet Assistive device: Rolling walker (2 wheeled)       General Gait Details:  partial step to vs step through gait pattern with decreased step height/length R > L; limited ability to respond to external perturbation.  Additional distance limited by fatigue.  Stairs            Wheelchair Mobility    Modified Rankin (Stroke Patients Only)       Balance Overall balance assessment: Needs assistance Sitting-balance support: No upper extremity supported;Feet supported Sitting balance-Leahy Scale: Good     Standing balance support: Bilateral upper extremity supported Standing balance-Leahy Scale: Fair                               Pertinent Vitals/Pain Pain Assessment: No/denies pain    Home Living Family/patient expects to be discharged to:: Private residence Living Arrangements: Alone Available Help at Discharge: Personal care attendant (Hired caregiver 3 days/week, 4 hours/day; son lives next door) Type of Home: House Home Access: Ramped entrance     Home Layout: One level Home Equipment: Kasandra Knudsen - single point      Prior Function Level of Independence: Independent with assistive device(s)         Comments: Mod indep with SPC for ADLs, household and limited community mobility; denies fall history outside of this admission.     Hand Dominance        Extremity/Trunk Assessment   Upper Extremity Assessment:  (R UE elevation limited approx 45 degrees (previous injury); L UE grossly WFl)           Lower Extremity Assessment:  (grossly at least 4/5 throughout bilat LEs)         Communication   Communication:  No difficulties  Cognition Arousal/Alertness: Awake/alert Behavior During Therapy: WFL for tasks assessed/performed Overall Cognitive Status: Within Functional Limits for tasks assessed                      General Comments      Exercises Other Exercises Other Exercises: Sit/stand x3 with RW, min assist +1--UE support and multiple attempts required to complete. Other Exercises: Forward/retro stepping  with RW, min/mod assist--increased sway in A/P plane with tendancy towards posterior LOB; unsafe to copmlete without +1 at this time      Assessment/Plan    PT Assessment Patient needs continued PT services  PT Diagnosis Difficulty walking;Generalized weakness   PT Problem List Decreased strength;Decreased activity tolerance;Decreased balance;Decreased mobility;Decreased knowledge of use of DME;Cardiopulmonary status limiting activity  PT Treatment Interventions DME instruction;Gait training;Stair training;Functional mobility training;Therapeutic activities;Therapeutic exercise;Balance training;Patient/family education   PT Goals (Current goals can be found in the Care Plan section) Acute Rehab PT Goals Patient Stated Goal: "I know I can't go back home by myself" PT Goal Formulation: With patient Time For Goal Achievement: 09/08/15 Potential to Achieve Goals: Good    Frequency Min 2X/week   Barriers to discharge Decreased caregiver support;Inaccessible home environment      Co-evaluation               End of Session Equipment Utilized During Treatment: Gait belt Activity Tolerance: Patient tolerated treatment well Patient left: with call bell/phone within reach;in chair;with chair alarm set Nurse Communication: Mobility status         Time: [id number] PT Time Calculation (min) (ACUTE ONLY): 27 min   Charges:   PT Evaluation $PT Eval Moderate Complexity: 1 Procedure PT Treatments $Therapeutic Activity: 8-22 mins   PT G Codes:        Dimetrius Montfort H. Owens Shark, PT, DPT, NCS 08/25/2015, 3:00 PM [phone]

## 2015-08-25 NOTE — Progress Notes (Signed)
Pt complaining of HA and cough.  MD Dr. Jannifer Franklin notified- to place orders for robitussin and low dose oxycodone Chad Rollins

## 2015-08-25 NOTE — NC FL2 (Signed)
[place] LEVEL OF CARE SCREENING TOOL     IDENTIFICATION  Patient Name: Chad Rollins Birthdate: January 05, 1936 Sex: male Admission Date (Current Location): 08/24/2015  [place] and [place] Number:  Engineering geologist and Address:  [facility], [address], [place], [place] [address]      Provider Number: [id number]  Attending Physician Name and Address:  Max Sane, MD  Relative Name and Phone Number:       Current Level of Care: Hospital Recommended Level of Care: [place] Prior Approval Number:    Date Approved/Denied:   PASRR Number:  ([id number])  Discharge Plan: SNF    Current Diagnoses: Patient Active Problem List   Diagnosis Date Noted  . Elevated troponin 08/24/2015  . BPH with obstruction/lower urinary tract symptoms 01/21/2015  . Right lower quadrant abdominal pain 01/21/2015    Orientation RESPIRATION BLADDER Height & Weight     Self, Time, Situation, Place  Normal Continent Weight: 213 lb 8 oz (96.843 kg) Height:  5\' 9"  (175.3 cm)  BEHAVIORAL SYMPTOMS/MOOD NEUROLOGICAL BOWEL NUTRITION STATUS     (None) Continent  (Carb Modified )  AMBULATORY STATUS COMMUNICATION OF NEEDS Skin   Extensive Assist Verbally Normal                       Personal Care Assistance Level of Assistance  Bathing, Feeding, Dressing, Bathing Assistance: Limited assistance Feeding assistance: Minimal assistance Dressing Assistance: Limited assistance     Functional Limitations Info  Sight, Hearing, Speech Sight Info: Impaired Hearing Info: Impaired Speech Info: Adequate    SPECIAL CARE FACTORS FREQUENCY  PT (By licensed PT)     PT Frequency:  (5)              Contractures      Additional Factors Info  Allergies   Allergies Info: Tylenol           Current Medications (08/25/2015):  This is the current hospital active medication list Current Facility-Administered Medications  Medication  Dose Route Frequency Provider Last Rate Last Dose  . 0.9 %  sodium chloride infusion   Intravenous Continuous Bettey Costa, MD 100 mL/hr at 08/25/15 1008    . alum & mag hydroxide-simeth (MAALOX/MYLANTA) 200-200-20 MG/5ML suspension 30 mL  30 mL Oral Q6H PRN Bettey Costa, MD      . amitriptyline (ELAVIL) tablet 25 mg  25 mg Oral QHS Bettey Costa, MD   25 mg at 08/24/15 2253  . aspirin EC tablet 81 mg  81 mg Oral Daily Bettey Costa, MD   81 mg at 08/25/15 0924  . atorvastatin (LIPITOR) tablet 20 mg  20 mg Oral Daily Bettey Costa, MD   20 mg at 08/25/15 0924  . brimonidine (ALPHAGAN) 0.2 % ophthalmic solution 1 drop  1 drop Left Eye BID Bettey Costa, MD   1 drop at 08/25/15 0925  . busPIRone (BUSPAR) tablet 15 mg  15 mg Oral TID Bettey Costa, MD   15 mg at 08/25/15 0924  . cholecalciferol (VITAMIN D) tablet 2,000 Units  2,000 Units Oral Daily Bettey Costa, MD   2,000 Units at 08/25/15 0924  . cloNIDine (CATAPRES) tablet 0.1 mg  0.1 mg Oral BID Bettey Costa, MD   0.1 mg at 08/25/15 0924  . clopidogrel (PLAVIX) tablet 75 mg  75 mg Oral QHS Bettey Costa, MD   75 mg at 08/24/15 2253  . dorzolamide-timolol (COSOPT) 22.3-6.8 MG/ML ophthalmic solution 1 drop  1 drop Both Eyes BID Bettey Costa, MD   1 drop at 08/25/15 0926  . enoxaparin (LOVENOX) injection 40 mg  40 mg Subcutaneous Q24H Bettey Costa, MD   40 mg at 08/24/15 2252  . finasteride (PROSCAR) tablet 5 mg  5 mg Oral Daily Bettey Costa, MD   5 mg at 08/25/15 0924  . insulin aspart (novoLOG) injection 0-5 Units  0-5 Units Subcutaneous QHS Bettey Costa, MD   0 Units at 08/24/15 2200  . insulin aspart (novoLOG) injection 0-9 Units  0-9 Units Subcutaneous TID WC Bettey Costa, MD   2 Units at 08/25/15 1149  . ipratropium-albuterol (DUONEB) 0.5-2.5 (3) MG/3ML nebulizer solution 3 mL  3 mL Nebulization Q4H Harrie Foreman, MD   3 mL at 08/25/15 1143  . latanoprost (XALATAN) 0.005 % ophthalmic solution 1 drop  1 drop Both Eyes QHS Bettey Costa, MD   1 drop at 08/24/15 2253  .  levothyroxine (SYNTHROID, LEVOTHROID) tablet 75 mcg  75 mcg Oral QAC breakfast Bettey Costa, MD   75 mcg at 08/25/15 0816  . losartan (COZAAR) tablet 50 mg  50 mg Oral BID Bettey Costa, MD   50 mg at 08/25/15 0924  . metoprolol tartrate (LOPRESSOR) tablet 25 mg  25 mg Oral BID Bettey Costa, MD   25 mg at 08/25/15 0956  . multivitamin-lutein (OCUVITE-LUTEIN) capsule 1 capsule  1 capsule Oral BID Bettey Costa, MD   1 capsule at 08/25/15 0924  . nystatin (MYCOSTATIN) powder 1 g  1 g Topical BID PRN Bettey Costa, MD      . ondansetron (ZOFRAN) tablet 4 mg  4 mg Oral Q6H PRN Bettey Costa, MD       Or  . ondansetron (ZOFRAN) injection 4 mg  4 mg Intravenous Q6H PRN Sital Mody, MD      . QUEtiapine (SEROQUEL) tablet 25-50 mg  25-50 mg Oral QHS PRN Bettey Costa, MD      . senna-docusate (Senokot-S) tablet 1 tablet  1 tablet Oral QHS PRN Bettey Costa, MD      . sodium chloride flush (NS) 0.9 % injection 3 mL  3 mL Intravenous Q12H Sital Mody, MD   3 mL at 08/24/15 2200  . tamsulosin (FLOMAX) capsule 0.4 mg  0.4 mg Oral QPC supper Bettey Costa, MD   0.4 mg at 08/24/15 2252  . vitamin B-12 (CYANOCOBALAMIN) tablet 1,000 mcg  1,000 mcg Oral QHS Bettey Costa, MD   1,000 mcg at 08/24/15 2252     Discharge Medications: Please see discharge summary for a list of discharge medications.  Relevant Imaging Results:  Relevant Lab Results:   Additional Information SSN [id number]  [place], LCSW

## 2015-08-25 NOTE — Clinical Social Work Placement (Signed)
   CLINICAL SOCIAL WORK PLACEMENT  NOTE  Date:  08/25/2015  Patient Details  Name: Chad Rollins MRN: [id number] Date of Birth: Mar 13, 1936  Clinical Social Work is seeking post-discharge placement for this patient at the [place] level of care (*CSW will initial, date and re-position this form in  chart as items are completed):  Yes   Patient/family provided with [place] Work Department's list of facilities offering this level of care within the geographic area requested by the patient (or if unable, by the patient's family).  Yes   Patient/family informed of their freedom to choose among providers that offer the needed level of care, that participate in Medicare, Medicaid or managed care program needed by the patient, have an available bed and are willing to accept the patient.  Yes   Patient/family informed of [facility]'s ownership interest in [facility] and [facility], as well as of the fact that they are under no obligation to receive care at these facilities.  PASRR submitted to EDS on       PASRR number received on       Existing PASRR number confirmed on 08/25/15     FL2 transmitted to all facilities in geographic area requested by pt/family on 08/25/15     FL2 transmitted to all facilities within larger geographic area on       Patient informed that his/her managed care company has contracts with or will negotiate with certain facilities, including the following:            Patient/family informed of bed offers received.  Patient chooses bed at       Physician recommends and patient chooses bed at      Patient to be transferred to   on  .  Patient to be transferred to facility by       Patient family notified on   of transfer.  Name of family member notified:        PHYSICIAN       Additional Comment:    _______________________________________________ Baldemar Lenis, LCSW 08/25/2015, 3:12 PM

## 2015-08-25 NOTE — Plan of Care (Signed)
Problem: Safety: Goal: Ability to remain free from injury will improve Outcome: Progressing Fall precautions in place  Problem: Physical Regulation: Goal: Ability to maintain clinical measurements within normal limits will improve Outcome: Not Progressing Pending PT evaluation  Problem: Tissue Perfusion: Goal: Risk factors for ineffective tissue perfusion will decrease SQ Lovenox

## 2015-08-26 DIAGNOSIS — N401 Enlarged prostate with lower urinary tract symptoms: Secondary | ICD-10-CM | POA: Diagnosis not present

## 2015-08-26 DIAGNOSIS — E785 Hyperlipidemia, unspecified: Secondary | ICD-10-CM | POA: Diagnosis not present

## 2015-08-26 DIAGNOSIS — Z7984 Long term (current) use of oral hypoglycemic drugs: Secondary | ICD-10-CM | POA: Diagnosis not present

## 2015-08-26 DIAGNOSIS — Z9181 History of falling: Secondary | ICD-10-CM | POA: Diagnosis not present

## 2015-08-26 DIAGNOSIS — Z7902 Long term (current) use of antithrombotics/antiplatelets: Secondary | ICD-10-CM | POA: Diagnosis not present

## 2015-08-26 DIAGNOSIS — M069 Rheumatoid arthritis, unspecified: Secondary | ICD-10-CM | POA: Diagnosis not present

## 2015-08-26 DIAGNOSIS — Z743 Need for continuous supervision: Secondary | ICD-10-CM | POA: Diagnosis not present

## 2015-08-26 DIAGNOSIS — I10 Essential (primary) hypertension: Secondary | ICD-10-CM | POA: Diagnosis not present

## 2015-08-26 DIAGNOSIS — R2689 Other abnormalities of gait and mobility: Secondary | ICD-10-CM | POA: Diagnosis not present

## 2015-08-26 DIAGNOSIS — T796XXD Traumatic ischemia of muscle, subsequent encounter: Secondary | ICD-10-CM | POA: Diagnosis not present

## 2015-08-26 DIAGNOSIS — I350 Nonrheumatic aortic (valve) stenosis: Secondary | ICD-10-CM | POA: Diagnosis not present

## 2015-08-26 DIAGNOSIS — R531 Weakness: Secondary | ICD-10-CM | POA: Diagnosis not present

## 2015-08-26 DIAGNOSIS — E039 Hypothyroidism, unspecified: Secondary | ICD-10-CM | POA: Diagnosis not present

## 2015-08-26 DIAGNOSIS — D72829 Elevated white blood cell count, unspecified: Secondary | ICD-10-CM | POA: Diagnosis not present

## 2015-08-26 DIAGNOSIS — E119 Type 2 diabetes mellitus without complications: Secondary | ICD-10-CM | POA: Diagnosis not present

## 2015-08-26 DIAGNOSIS — N138 Other obstructive and reflux uropathy: Secondary | ICD-10-CM | POA: Diagnosis not present

## 2015-08-26 DIAGNOSIS — J309 Allergic rhinitis, unspecified: Secondary | ICD-10-CM | POA: Diagnosis not present

## 2015-08-26 DIAGNOSIS — F419 Anxiety disorder, unspecified: Secondary | ICD-10-CM | POA: Diagnosis not present

## 2015-08-26 DIAGNOSIS — R7989 Other specified abnormal findings of blood chemistry: Secondary | ICD-10-CM | POA: Diagnosis not present

## 2015-08-26 DIAGNOSIS — R296 Repeated falls: Secondary | ICD-10-CM | POA: Diagnosis not present

## 2015-08-26 DIAGNOSIS — H409 Unspecified glaucoma: Secondary | ICD-10-CM | POA: Diagnosis not present

## 2015-08-26 DIAGNOSIS — G629 Polyneuropathy, unspecified: Secondary | ICD-10-CM | POA: Diagnosis not present

## 2015-08-26 DIAGNOSIS — M6281 Muscle weakness (generalized): Secondary | ICD-10-CM | POA: Diagnosis not present

## 2015-08-26 DIAGNOSIS — Z8673 Personal history of transient ischemic attack (TIA), and cerebral infarction without residual deficits: Secondary | ICD-10-CM | POA: Diagnosis not present

## 2015-08-26 LAB — GLUCOSE, CAPILLARY
GLUCOSE-CAPILLARY: 159 mg/dL — AB (ref 65–99)
Glucose-Capillary: 153 mg/dL — ABNORMAL HIGH (ref 65–99)

## 2015-08-26 LAB — BASIC METABOLIC PANEL
ANION GAP: 10 (ref 5–15)
BUN: 22 mg/dL — ABNORMAL HIGH (ref 6–20)
CHLORIDE: 109 mmol/L (ref 101–111)
CO2: 24 mmol/L (ref 22–32)
Calcium: 8 mg/dL — ABNORMAL LOW (ref 8.9–10.3)
Creatinine, Ser: 1.08 mg/dL (ref 0.61–1.24)
GFR calc non Af Amer: >60 mL/min (ref >60)
Glucose, Bld: 169 mg/dL — ABNORMAL HIGH (ref 65–99)
Potassium: 3.4 mmol/L — ABNORMAL LOW (ref 3.5–5.1)
Sodium: 143 mmol/L (ref 135–145)

## 2015-08-26 MED ORDER — FUROSEMIDE 10 MG/ML IJ SOLN
20.0000 mg | Freq: Once | INTRAMUSCULAR | Status: AC
  Administered 2015-08-26: 20 mg via INTRAVENOUS
  Filled 2015-08-26: qty 2

## 2015-08-26 MED ORDER — GUAIFENESIN-DM 100-10 MG/5ML PO SYRP: 5.0000 mL | ORAL_SOLUTION | ORAL | Status: DC | PRN

## 2015-08-26 NOTE — Discharge Summary (Signed)
[place] [facility] [place] NAME: Chad Rollins    MR#:  [id number]  DATE OF BIRTH:  1935-11-13  DATE OF ADMISSION:  08/24/2015 ADMITTING PHYSICIAN: Bettey Costa, MD  DATE OF DISCHARGE: 08/26/2015 PRIMARY CARE PHYSICIAN: SPARKS,JEFFREY D, MD    ADMISSION DIAGNOSIS:  Ataxia [R27.0] Elevated troponin [R79.89] Generalized weakness [R53.1]  DISCHARGE DIAGNOSIS:  Active Problems:   Elevated troponin   SECONDARY DIAGNOSIS:   Past Medical History  Diagnosis Date  . Rheumatoid arthritis ([place])   . Stroke ([place])   . Peripheral neuropathy ([place])   . Diabetes mellitus ([place])   . HLD (hyperlipidemia)   . Allergic rhinitis   . Aortic stenosis   . Hypothyroidism   . Injury of right rotator cuff   . Osteoarthritis   . Obesity   . Continuous right lower quadrant pain   . BPH with obstruction/lower urinary tract symptoms   . HTN (hypertension)     HOSPITAL COURSE:   This is 80 year old male with a history of heart murmur and CVA who presents after mechanical fall and was found several hours after his fall by his daughter.  1. Elevated troponin: This is from demand ischemia not ACS. No arrhythmia seen on telemetry.  2. Mechanical fall with rhabdomyolysis: He was treated with IV fluids. He will need SNF at discharge. 3. Type 2 diabetes without complication: Continue  ADA diet and outpatient medications. Hemoglobin A1c 5.6  4. Essential hypertension: Continue metoprolol, clonidine, losartan at discharge.  5. History of CVA: Continue Plavix and atorvastatin.  6. hypothyroid: Continue Synthroid.  7. Heart murmur: This is known as per patient family and patient. His cardiologist is Dr. Ubaldo Glassing  8. Leukocytosis: This is due to fall and dehydration   DISCHARGE CONDITIONS AND DIET:  Stable diabetic  CONSULTS OBTAINED:     DRUG ALLERGIES:   Allergies  Allergen Reactions  . Tylenol [Acetaminophen] Other (See Comments)    Reaction:  Unknown      DISCHARGE MEDICATIONS:   Current Discharge Medication List    START taking these medications   Details  guaiFENesin-dextromethorphan (ROBITUSSIN DM) 100-10 MG/5ML syrup Take 5 mLs by mouth every 4 (four) hours as needed for cough. Qty: 118 mL, Refills: 0      CONTINUE these medications which have NOT CHANGED   Details  amitriptyline (ELAVIL) 25 MG tablet Take 25 mg by mouth at bedtime.    aspirin EC 81 MG tablet Take 81 mg by mouth daily.    atorvastatin (LIPITOR) 20 MG tablet Take 20 mg by mouth daily.     brimonidine (ALPHAGAN) 0.2 % ophthalmic solution Place 1 drop into the left eye 2 (two) times daily.    busPIRone (BUSPAR) 15 MG tablet Take 15 mg by mouth 3 (three) times daily.     cholecalciferol (VITAMIN D) 1000 units tablet Take 2,000 Units by mouth daily.    cloNIDine (CATAPRES) 0.1 MG tablet Take 0.1 mg by mouth 2 (two) times daily.    clopidogrel (PLAVIX) 75 MG tablet Take 75 mg by mouth at bedtime.     dorzolamide-timolol (COSOPT) 22.3-6.8 MG/ML ophthalmic solution Place 1 drop into both eyes 2 (two) times daily.    finasteride (PROSCAR) 5 MG tablet Take 1 tablet (5 mg total) by mouth daily. Qty: 30 tablet, Refills: 3   Associated Diagnoses: BPH (benign prostatic hyperplasia)    glipiZIDE (GLUCOTROL) 5 MG tablet Take 5 mg by mouth 2 (two) times daily before a meal.  latanoprost (XALATAN) 0.005 % ophthalmic solution Place 1 drop into both eyes at bedtime.     levothyroxine (SYNTHROID, LEVOTHROID) 75 MCG tablet Take 75 mcg by mouth daily before breakfast.    losartan (COZAAR) 50 MG tablet Take 50 mg by mouth 2 (two) times daily.     metFORMIN (GLUCOPHAGE) 1000 MG tablet Take 1,000 mg by mouth 2 (two) times daily with a meal.    metoprolol tartrate (LOPRESSOR) 25 MG tablet Take 25 mg by mouth 2 (two) times daily.     Multiple Vitamins-Minerals (PRESERVISION AREDS 2) CAPS Take 1 capsule by mouth 2 (two) times daily.    nystatin (MYCOSTATIN) powder  Apply 1 g topically 2 (two) times daily as needed (for rash).     QUEtiapine (SEROQUEL) 25 MG tablet Take 25-50 mg by mouth at bedtime as needed (for sleep).    tamsulosin (FLOMAX) 0.4 MG CAPS capsule Take 0.4 mg by mouth daily after supper.     vitamin B-12 (CYANOCOBALAMIN) 1000 MCG tablet Take 1,000 mcg by mouth at bedtime.              Today   CHIEF COMPLAINT:  Doing well had episode of SOB last night but now improved 95% RA   VITAL SIGNS:  Blood pressure 143/56, pulse 94, temperature 98.6 F (37 C), temperature source Oral, resp. rate 24, height 5\' 9"  (1.753 m), weight 96.843 kg (213 lb 8 oz), SpO2 95 %.   REVIEW OF SYSTEMS:  Review of Systems  Constitutional: Negative for fever, chills and malaise/fatigue.  HENT: Negative for sore throat.   Eyes: Negative for blurred vision.  Respiratory: Positive for cough. Negative for hemoptysis, shortness of breath and wheezing.   Cardiovascular: Negative for chest pain, palpitations and leg swelling.  Gastrointestinal: Negative for nausea, vomiting, abdominal pain, diarrhea and blood in stool.  Genitourinary: Negative for dysuria.  Musculoskeletal: Negative for back pain.  Neurological: Negative for dizziness, tremors and headaches.  Endo/Heme/Allergies: Does not bruise/bleed easily.     PHYSICAL EXAMINATION:  GENERAL:  80 y.o.-year-old patient lying in the bed with no acute distress.  NECK:  Supple, no jugular venous distention. No thyroid enlargement, no tenderness.  LUNGS: Normal breath sounds bilaterally, no wheezing, rales,rhonchi  No use of accessory muscles of respiration.  CARDIOVASCULAR: S1, S2 normal. 3/6 HSM NO rubs, or gallops.  ABDOMEN: Soft, non-tender, non-distended. Bowel sounds present. No organomegaly or mass.  EXTREMITIES: No pedal edema, cyanosis, or clubbing.  PSYCHIATRIC: The patient is alert and oriented x 3.  SKIN: No obvious rash, lesion, or ulcer.   DATA REVIEW:   CBC  Recent Labs Lab  08/25/15 0328  WBC 11.5*  HGB 13.6  HCT 40.2  PLT 111*    Chemistries   Recent Labs Lab 08/24/15 1407  08/26/15 0652  NA 141  < > 143  K 4.0  < > 3.4*  CL 107  < > 109  CO2 25  < > 24  GLUCOSE 241*  < > 169*  BUN 26*  < > 22*  CREATININE 1.25*  < > 1.08  CALCIUM 8.9  < > 8.0*  AST 35  --   --   ALT 18  --   --   ALKPHOS 72  --   --   BILITOT 1.3*  --   --   < > = values in this interval not displayed.  Cardiac Enzymes  Recent Labs Lab 08/24/15 2028 08/25/15 0328 08/25/15 1032  TROPONINI 0.92* 0.72* 0.40*  Microbiology Results  @MICRORSLT48 @  RADIOLOGY:  Ct Head Wo Contrast  08/24/2015  CLINICAL DATA:  80 year old who fell last night while trying to turn on a lamp at home. Patient was unable to get up and was found on the floor by his daughter at 10:45 a.m. today. Patient denies loss consciousness. EXAM: CT HEAD WITHOUT CONTRAST TECHNIQUE: Contiguous axial images were obtained from the base of the skull through the vertex without intravenous contrast. COMPARISON:  CT head 08/07/2014 and earlier.  MRI brain 08/07/2014. FINDINGS: Moderate cortical and deep atrophy, slightly progressive since 2012. Severe changes of small vessel disease of the white matter diffusely, also progressive. Old lacunar strokes in the basal ganglia bilaterally as noted on prior MR. No mass lesion. No midline shift. No acute hemorrhage or hematoma. No extra-axial fluid collections. No evidence of acute infarction. No skull fracture or other focal osseous abnormality involving the skull. 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 Visualized paranasal sinuses, bilateral mastoid air cells and bilateral middle ear cavities well-aerated. Moderate to severe bilateral carotid siphon and vertebral artery atherosclerosis. Right globe prosthesis. IMPRESSION: 1. No acute intracranial abnormality. 2. Moderate generalized atrophy and severe chronic microvascular ischemic changes of  the white matter diffusely. 3. Old lacunar strokes in the basal ganglia bilaterally. Electronically Signed   By: Evangeline Dakin M.D.   On: 08/24/2015 15:06   Dg Chest Port 1 View  08/24/2015  CLINICAL DATA:  80 year old who fell last night while trying to turn on a lamp at home, unable to get up after the fall. Patient found in the floor this morning about 10:45 a.m. Current history of diabetes hypertension. EXAM: PORTABLE CHEST 1 VIEW COMPARISON:  2/4/1,016 and earlier, including CTA chest 9/20 11/1012. FINDINGS: Suboptimal inspiration accounts for crowded bronchovascular markings diffusely and atelectasis in the bases, and accentuates the cardiac silhouette. Taking this into account, cardiac silhouette moderately enlarged, unchanged. Lungs otherwise clear. No localized airspace consolidation. No pleural effusions. No pneumothorax. Normal pulmonary vascularity. IMPRESSION: Suboptimal inspiration accounts for bibasilar atelectasis. No acute cardiopulmonary disease otherwise. Stable cardiomegaly. Electronically Signed   By: Evangeline Dakin M.D.   On: 08/24/2015 15:15      Management plans discussed with the patient and he is in agreement. Stable for discharge   Patient should follow up with PCP in 1 week  CODE STATUS:     Code Status Orders        Start     Ordered   08/24/15 1956  Full code   Continuous     08/24/15 1955    Code Status History    Date Active Date Inactive Code Status Order ID Comments User Context   This patient has a current code status but no historical code status.    Advance Directive Documentation        Most Recent Value   Type of Advance Directive  Healthcare Power of Attorney   Pre-existing out of facility DNR order (yellow form or pink MOST form)     "MOST" Form in Place?        TOTAL TIME TAKING CARE OF THIS PATIENT: 35 minutes.    Note: This dictation was prepared with Dragon dictation along with smaller phrase technology. Any transcriptional  errors that result from this process are unintentional.  Ashok Sawaya M.D on 08/26/2015 at 9:52 AM  Between 7am to 6pm - Pager - [phone] After 6pm go to [email or web address] - password Child psychotherapist Hospitalists  Office  [phone]  CC: Primary care physician; Idelle Crouch, MD

## 2015-08-26 NOTE — Progress Notes (Signed)
Clinical Social Worker informed that patient will be medically ready to discharge to [facility]. Patient and his daughter [name] are in a agreement with plan. CSW called Kim-admissions coordinator at [facility] to confirm that patient's bed is ready. Provided patient's room number 220 and number to call for report [phone] . All discharge information faxed to [facility] via [place].   Call to patient's daughter [name] inform her patient would discharge to [facility]. RN will call report and patient will discharge to [facility] via [facility].   Ernest Pine, MSW, [place] Social Work Department [phone]

## 2015-08-26 NOTE — Progress Notes (Signed)
Report called to [place] at [place]. Pt reported a cough and received robtussin. No other pain. IV and tele removed. [name] was updated on tx to [facility]. EMS to transport. Pt has no further cocnerns at this time.

## 2015-08-26 NOTE — Progress Notes (Addendum)
MD became disoriented to place during shift- pt easily reoriented.  Pt reassessed 30 minutes later and was oriented x 4. Pt had an episode of incontinence during shift- otherwise used the urinal.  Respiratory was called to assess pt- wheezing/tachypnic-- pt is a mouth breather while he sleeps.  RN noted a rash on groin/back and abdomen- pt said he gets a rash from time to time- nystatin already ordered for pt- administered to affected areas.  Will continue to monitor  MD, Dr. Jannifer Franklin notified for above reasons- MD ordered to hold fluids for now, apply 2 L of oxygen and give neb treatment. Aware of rash- okay with nystatin intervention.  Will continue to monitor and watch respiratory status. Chad Rollins

## 2015-08-26 NOTE — Clinical Social Work Placement (Signed)
   CLINICAL SOCIAL WORK PLACEMENT  NOTE  Date:  08/26/2015  Patient Details  Name: Chad Rollins MRN: [id number] Date of Birth: [birth year]  Clinical Social Work is seeking post-discharge placement for this patient at the [place] level of care (*CSW will initial, date and re-position this form in  chart as items are completed):  Yes   Patient/family provided with [facility] [place] Work Department's list of facilities offering this level of care within the geographic area requested by the patient (or if unable, by the patient's family).  Yes   Patient/family informed of their freedom to choose among providers that offer the needed level of care, that participate in Medicare, Medicaid or managed care program needed by the patient, have an available bed and are willing to accept the patient.  Yes   Patient/family informed of [facility]'s ownership interest in [facility] and [facility], as well as of the fact that they are under no obligation to receive care at these facilities.  PASRR submitted to EDS on       PASRR number received on       Existing PASRR number confirmed on 08/25/15     FL2 transmitted to all facilities in geographic area requested by pt/family on 08/25/15     FL2 transmitted to all facilities within larger geographic area on       Patient informed that his/her managed care company has contracts with or will negotiate with certain facilities, including the following:        Yes   Patient/family informed of bed offers received.  Patient chooses bed at  [facility])     Physician recommends and patient chooses bed at      Patient to be transferred to  [facility]) on 08/26/15.  Patient to be transferred to facility by  [facility])     Patient family notified on 08/26/15 of transfer.  Name of family member notified:   [name]- Daughter )     PHYSICIAN       Additional Comment:     _______________________________________________ Baldemar Lenis, LCSW 08/26/2015, 11:51 AM

## 2015-08-26 NOTE — Care Management Important Message (Signed)
Important Message  Patient Details  Name: Jerrett Durling MRN: [id number] Date of Birth: 11/03/1935   Medicare Important Message Given:  Yes    Juliann Pulse A Ashya Nicolaisen 08/26/2015, 10:36 AM

## 2015-08-26 NOTE — Progress Notes (Signed)
Respiratory to assess pt- hears some crackles in lungs- MD, Dr. Jannifer Franklin notified- MD gave verbal order for IV lasix 20 mg once [place] Avers

## 2015-08-27 DIAGNOSIS — R627 Adult failure to thrive: Secondary | ICD-10-CM | POA: Diagnosis not present

## 2015-08-27 DIAGNOSIS — E119 Type 2 diabetes mellitus without complications: Secondary | ICD-10-CM | POA: Diagnosis not present

## 2015-08-27 DIAGNOSIS — E860 Dehydration: Secondary | ICD-10-CM | POA: Diagnosis not present

## 2015-09-02 ENCOUNTER — Encounter
Admission: RE | Admit: 2015-09-02 | Discharge: 2015-09-02 | Disposition: A | Discharge status: Home / Self Care | Payer: PPO | Source: Ambulatory Visit | Attending: Internal Medicine | Admitting: Internal Medicine

## 2015-09-02 DIAGNOSIS — N138 Other obstructive and reflux uropathy: Secondary | ICD-10-CM | POA: Diagnosis not present

## 2015-09-02 DIAGNOSIS — Z8673 Personal history of transient ischemic attack (TIA), and cerebral infarction without residual deficits: Secondary | ICD-10-CM | POA: Diagnosis not present

## 2015-09-02 DIAGNOSIS — J309 Allergic rhinitis, unspecified: Secondary | ICD-10-CM | POA: Diagnosis not present

## 2015-09-02 DIAGNOSIS — E039 Hypothyroidism, unspecified: Secondary | ICD-10-CM | POA: Diagnosis not present

## 2015-09-02 DIAGNOSIS — I350 Nonrheumatic aortic (valve) stenosis: Secondary | ICD-10-CM | POA: Diagnosis not present

## 2015-09-02 DIAGNOSIS — E119 Type 2 diabetes mellitus without complications: Secondary | ICD-10-CM | POA: Insufficient documentation

## 2015-09-02 DIAGNOSIS — T796XXD Traumatic ischemia of muscle, subsequent encounter: Secondary | ICD-10-CM | POA: Diagnosis not present

## 2015-09-02 DIAGNOSIS — E785 Hyperlipidemia, unspecified: Secondary | ICD-10-CM | POA: Diagnosis not present

## 2015-09-02 DIAGNOSIS — I10 Essential (primary) hypertension: Secondary | ICD-10-CM | POA: Diagnosis not present

## 2015-09-02 DIAGNOSIS — N401 Enlarged prostate with lower urinary tract symptoms: Secondary | ICD-10-CM | POA: Diagnosis not present

## 2015-09-02 DIAGNOSIS — F419 Anxiety disorder, unspecified: Secondary | ICD-10-CM | POA: Diagnosis not present

## 2015-09-02 DIAGNOSIS — Z794 Long term (current) use of insulin: Secondary | ICD-10-CM | POA: Insufficient documentation

## 2015-09-02 DIAGNOSIS — M069 Rheumatoid arthritis, unspecified: Secondary | ICD-10-CM | POA: Diagnosis not present

## 2015-09-02 DIAGNOSIS — G629 Polyneuropathy, unspecified: Secondary | ICD-10-CM | POA: Diagnosis not present

## 2015-09-02 DIAGNOSIS — M6281 Muscle weakness (generalized): Secondary | ICD-10-CM | POA: Diagnosis not present

## 2015-09-02 DIAGNOSIS — Z9181 History of falling: Secondary | ICD-10-CM | POA: Diagnosis not present

## 2015-09-02 DIAGNOSIS — Z7984 Long term (current) use of oral hypoglycemic drugs: Secondary | ICD-10-CM | POA: Diagnosis not present

## 2015-09-02 DIAGNOSIS — H409 Unspecified glaucoma: Secondary | ICD-10-CM | POA: Diagnosis not present

## 2015-09-02 DIAGNOSIS — Z7902 Long term (current) use of antithrombotics/antiplatelets: Secondary | ICD-10-CM | POA: Diagnosis not present

## 2015-09-02 LAB — GLUCOSE, CAPILLARY
GLUCOSE-CAPILLARY: 161 mg/dL — AB (ref 65–99)
GLUCOSE-CAPILLARY: 121 mg/dL — AB (ref 65–99)
Glucose-Capillary: 133 mg/dL — ABNORMAL HIGH (ref 65–99)

## 2015-09-03 DIAGNOSIS — E119 Type 2 diabetes mellitus without complications: Secondary | ICD-10-CM | POA: Diagnosis not present

## 2015-09-03 LAB — GLUCOSE, CAPILLARY
GLUCOSE-CAPILLARY: 169 mg/dL — AB (ref 65–99)
Glucose-Capillary: 105 mg/dL — ABNORMAL HIGH (ref 65–99)
Glucose-Capillary: 181 mg/dL — ABNORMAL HIGH (ref 65–99)
Glucose-Capillary: 190 mg/dL — ABNORMAL HIGH (ref 65–99)

## 2015-09-04 DIAGNOSIS — E119 Type 2 diabetes mellitus without complications: Secondary | ICD-10-CM | POA: Diagnosis not present

## 2015-09-04 LAB — GLUCOSE, CAPILLARY
GLUCOSE-CAPILLARY: 114 mg/dL — AB (ref 65–99)
GLUCOSE-CAPILLARY: 160 mg/dL — AB (ref 65–99)
GLUCOSE-CAPILLARY: 181 mg/dL — AB (ref 65–99)

## 2015-09-05 LAB — GLUCOSE, CAPILLARY
GLUCOSE-CAPILLARY: 169 mg/dL — AB (ref 65–99)
GLUCOSE-CAPILLARY: 98 mg/dL (ref 65–99)
GLUCOSE-CAPILLARY: 164 mg/dL — AB (ref 65–99)
GLUCOSE-CAPILLARY: 188 mg/dL — AB (ref 65–99)
Glucose-Capillary: 178 mg/dL — ABNORMAL HIGH (ref 65–99)

## 2015-09-06 DIAGNOSIS — E119 Type 2 diabetes mellitus without complications: Secondary | ICD-10-CM | POA: Diagnosis not present

## 2015-09-06 LAB — GLUCOSE, CAPILLARY
GLUCOSE-CAPILLARY: 96 mg/dL (ref 65–99)
GLUCOSE-CAPILLARY: 142 mg/dL — AB (ref 65–99)
GLUCOSE-CAPILLARY: 195 mg/dL — AB (ref 65–99)

## 2015-09-07 DIAGNOSIS — E119 Type 2 diabetes mellitus without complications: Secondary | ICD-10-CM | POA: Diagnosis not present

## 2015-09-07 LAB — GLUCOSE, CAPILLARY
GLUCOSE-CAPILLARY: 160 mg/dL — AB (ref 65–99)
Glucose-Capillary: 137 mg/dL — ABNORMAL HIGH (ref 65–99)

## 2015-09-08 DIAGNOSIS — E119 Type 2 diabetes mellitus without complications: Secondary | ICD-10-CM | POA: Diagnosis not present

## 2015-09-08 LAB — GLUCOSE, CAPILLARY
GLUCOSE-CAPILLARY: 74 mg/dL (ref 65–99)
GLUCOSE-CAPILLARY: 145 mg/dL — AB (ref 65–99)
Glucose-Capillary: 143 mg/dL — ABNORMAL HIGH (ref 65–99)

## 2015-09-09 LAB — GLUCOSE, CAPILLARY
GLUCOSE-CAPILLARY: 150 mg/dL — AB (ref 65–99)
GLUCOSE-CAPILLARY: 107 mg/dL — AB (ref 65–99)
GLUCOSE-CAPILLARY: 80 mg/dL (ref 65–99)
GLUCOSE-CAPILLARY: 161 mg/dL — AB (ref 65–99)
Glucose-Capillary: 87 mg/dL (ref 65–99)
Glucose-Capillary: 90 mg/dL (ref 65–99)
Glucose-Capillary: 94 mg/dL (ref 65–99)

## 2015-09-10 DIAGNOSIS — E119 Type 2 diabetes mellitus without complications: Secondary | ICD-10-CM | POA: Diagnosis not present

## 2015-09-10 LAB — GLUCOSE, CAPILLARY
GLUCOSE-CAPILLARY: 100 mg/dL — AB (ref 65–99)
GLUCOSE-CAPILLARY: 144 mg/dL — AB (ref 65–99)
GLUCOSE-CAPILLARY: 195 mg/dL — AB (ref 65–99)
Glucose-Capillary: 163 mg/dL — ABNORMAL HIGH (ref 65–99)

## 2015-09-11 DIAGNOSIS — E119 Type 2 diabetes mellitus without complications: Secondary | ICD-10-CM | POA: Diagnosis not present

## 2015-09-11 LAB — GLUCOSE, CAPILLARY
Glucose-Capillary: 85 mg/dL (ref 65–99)
Glucose-Capillary: 116 mg/dL — ABNORMAL HIGH (ref 65–99)
Glucose-Capillary: 233 mg/dL — ABNORMAL HIGH (ref 65–99)

## 2015-09-12 LAB — GLUCOSE, CAPILLARY
GLUCOSE-CAPILLARY: 81 mg/dL (ref 65–99)
Glucose-Capillary: 159 mg/dL — ABNORMAL HIGH (ref 65–99)

## 2015-09-14 DIAGNOSIS — R600 Localized edema: Secondary | ICD-10-CM | POA: Diagnosis not present

## 2015-09-14 DIAGNOSIS — M069 Rheumatoid arthritis, unspecified: Secondary | ICD-10-CM | POA: Diagnosis not present

## 2015-09-14 DIAGNOSIS — R296 Repeated falls: Secondary | ICD-10-CM | POA: Diagnosis not present

## 2015-09-14 DIAGNOSIS — R270 Ataxia, unspecified: Secondary | ICD-10-CM | POA: Diagnosis not present

## 2015-09-14 DIAGNOSIS — N401 Enlarged prostate with lower urinary tract symptoms: Secondary | ICD-10-CM | POA: Diagnosis not present

## 2015-09-14 DIAGNOSIS — I350 Nonrheumatic aortic (valve) stenosis: Secondary | ICD-10-CM | POA: Diagnosis not present

## 2015-09-14 DIAGNOSIS — E039 Hypothyroidism, unspecified: Secondary | ICD-10-CM | POA: Diagnosis not present

## 2015-09-14 DIAGNOSIS — R21 Rash and other nonspecific skin eruption: Secondary | ICD-10-CM | POA: Diagnosis not present

## 2015-09-14 DIAGNOSIS — E1140 Type 2 diabetes mellitus with diabetic neuropathy, unspecified: Secondary | ICD-10-CM | POA: Diagnosis not present

## 2015-09-14 DIAGNOSIS — E860 Dehydration: Secondary | ICD-10-CM | POA: Diagnosis not present

## 2015-09-14 DIAGNOSIS — F329 Major depressive disorder, single episode, unspecified: Secondary | ICD-10-CM | POA: Diagnosis not present

## 2015-09-14 DIAGNOSIS — E139 Other specified diabetes mellitus without complications: Secondary | ICD-10-CM | POA: Diagnosis not present

## 2015-09-14 DIAGNOSIS — Z7982 Long term (current) use of aspirin: Secondary | ICD-10-CM | POA: Diagnosis not present

## 2015-09-14 DIAGNOSIS — Z79899 Other long term (current) drug therapy: Secondary | ICD-10-CM | POA: Diagnosis not present

## 2015-09-14 DIAGNOSIS — E669 Obesity, unspecified: Secondary | ICD-10-CM | POA: Diagnosis not present

## 2015-09-14 DIAGNOSIS — M6281 Muscle weakness (generalized): Secondary | ICD-10-CM | POA: Diagnosis not present

## 2015-09-14 DIAGNOSIS — Z8673 Personal history of transient ischemic attack (TIA), and cerebral infarction without residual deficits: Secondary | ICD-10-CM | POA: Diagnosis not present

## 2015-09-14 DIAGNOSIS — M1990 Unspecified osteoarthritis, unspecified site: Secondary | ICD-10-CM | POA: Diagnosis not present

## 2015-09-14 DIAGNOSIS — I10 Essential (primary) hypertension: Secondary | ICD-10-CM | POA: Diagnosis not present

## 2015-09-14 DIAGNOSIS — Z9181 History of falling: Secondary | ICD-10-CM | POA: Diagnosis not present

## 2015-09-14 DIAGNOSIS — Z7984 Long term (current) use of oral hypoglycemic drugs: Secondary | ICD-10-CM | POA: Diagnosis not present

## 2015-09-14 DIAGNOSIS — E782 Mixed hyperlipidemia: Secondary | ICD-10-CM | POA: Diagnosis not present

## 2015-09-14 DIAGNOSIS — Z8739 Personal history of other diseases of the musculoskeletal system and connective tissue: Secondary | ICD-10-CM | POA: Diagnosis not present

## 2015-09-14 DIAGNOSIS — E6609 Other obesity due to excess calories: Secondary | ICD-10-CM | POA: Diagnosis not present

## 2015-09-14 DIAGNOSIS — K219 Gastro-esophageal reflux disease without esophagitis: Secondary | ICD-10-CM | POA: Diagnosis not present

## 2015-09-14 DIAGNOSIS — E785 Hyperlipidemia, unspecified: Secondary | ICD-10-CM | POA: Diagnosis not present

## 2015-09-17 ENCOUNTER — Emergency Department: Payer: PPO

## 2015-09-17 ENCOUNTER — Observation Stay
Admit: 2015-09-17 | Discharge: 2015-09-17 | Disposition: A | Discharge status: Home / Self Care | Payer: PPO | Attending: Internal Medicine | Admitting: Internal Medicine

## 2015-09-17 ENCOUNTER — Observation Stay
Admission: EM | Admit: 2015-09-17 | Discharge: 2015-09-18 | Disposition: A | Discharge status: Home / Self Care | Payer: PPO | Attending: Internal Medicine | Admitting: Internal Medicine

## 2015-09-17 ENCOUNTER — Encounter: Payer: Self-pay | Admitting: Emergency Medicine

## 2015-09-17 DIAGNOSIS — Z9181 History of falling: Secondary | ICD-10-CM | POA: Diagnosis not present

## 2015-09-17 DIAGNOSIS — I082 Rheumatic disorders of both aortic and tricuspid valves: Secondary | ICD-10-CM | POA: Diagnosis not present

## 2015-09-17 DIAGNOSIS — M069 Rheumatoid arthritis, unspecified: Secondary | ICD-10-CM | POA: Insufficient documentation

## 2015-09-17 DIAGNOSIS — R0789 Other chest pain: Principal | ICD-10-CM | POA: Insufficient documentation

## 2015-09-17 DIAGNOSIS — Z7984 Long term (current) use of oral hypoglycemic drugs: Secondary | ICD-10-CM | POA: Insufficient documentation

## 2015-09-17 DIAGNOSIS — E1142 Type 2 diabetes mellitus with diabetic polyneuropathy: Secondary | ICD-10-CM | POA: Diagnosis not present

## 2015-09-17 DIAGNOSIS — E1110 Type 2 diabetes mellitus with ketoacidosis without coma: Secondary | ICD-10-CM

## 2015-09-17 DIAGNOSIS — K219 Gastro-esophageal reflux disease without esophagitis: Secondary | ICD-10-CM | POA: Insufficient documentation

## 2015-09-17 DIAGNOSIS — E785 Hyperlipidemia, unspecified: Secondary | ICD-10-CM | POA: Insufficient documentation

## 2015-09-17 DIAGNOSIS — M1990 Unspecified osteoarthritis, unspecified site: Secondary | ICD-10-CM | POA: Diagnosis not present

## 2015-09-17 DIAGNOSIS — I350 Nonrheumatic aortic (valve) stenosis: Secondary | ICD-10-CM | POA: Diagnosis not present

## 2015-09-17 DIAGNOSIS — H540 Blindness, both eyes: Secondary | ICD-10-CM | POA: Insufficient documentation

## 2015-09-17 DIAGNOSIS — E039 Hypothyroidism, unspecified: Secondary | ICD-10-CM | POA: Insufficient documentation

## 2015-09-17 DIAGNOSIS — Z886 Allergy status to analgesic agent status: Secondary | ICD-10-CM | POA: Diagnosis not present

## 2015-09-17 DIAGNOSIS — E669 Obesity, unspecified: Secondary | ICD-10-CM | POA: Diagnosis not present

## 2015-09-17 DIAGNOSIS — E119 Type 2 diabetes mellitus without complications: Secondary | ICD-10-CM | POA: Diagnosis not present

## 2015-09-17 DIAGNOSIS — F329 Major depressive disorder, single episode, unspecified: Secondary | ICD-10-CM | POA: Diagnosis not present

## 2015-09-17 DIAGNOSIS — Z87891 Personal history of nicotine dependence: Secondary | ICD-10-CM | POA: Insufficient documentation

## 2015-09-17 DIAGNOSIS — R079 Chest pain, unspecified: Secondary | ICD-10-CM | POA: Diagnosis not present

## 2015-09-17 DIAGNOSIS — F419 Anxiety disorder, unspecified: Secondary | ICD-10-CM | POA: Insufficient documentation

## 2015-09-17 DIAGNOSIS — H548 Legal blindness, as defined in USA: Secondary | ICD-10-CM | POA: Diagnosis not present

## 2015-09-17 DIAGNOSIS — Z9049 Acquired absence of other specified parts of digestive tract: Secondary | ICD-10-CM | POA: Diagnosis not present

## 2015-09-17 DIAGNOSIS — Z7901 Long term (current) use of anticoagulants: Secondary | ICD-10-CM | POA: Insufficient documentation

## 2015-09-17 DIAGNOSIS — R918 Other nonspecific abnormal finding of lung field: Secondary | ICD-10-CM | POA: Diagnosis not present

## 2015-09-17 DIAGNOSIS — I10 Essential (primary) hypertension: Secondary | ICD-10-CM | POA: Diagnosis not present

## 2015-09-17 DIAGNOSIS — Z8673 Personal history of transient ischemic attack (TIA), and cerebral infarction without residual deficits: Secondary | ICD-10-CM | POA: Insufficient documentation

## 2015-09-17 DIAGNOSIS — R778 Other specified abnormalities of plasma proteins: Secondary | ICD-10-CM | POA: Diagnosis not present

## 2015-09-17 DIAGNOSIS — Z7982 Long term (current) use of aspirin: Secondary | ICD-10-CM | POA: Insufficient documentation

## 2015-09-17 DIAGNOSIS — R1031 Right lower quadrant pain: Secondary | ICD-10-CM | POA: Insufficient documentation

## 2015-09-17 DIAGNOSIS — J309 Allergic rhinitis, unspecified: Secondary | ICD-10-CM | POA: Insufficient documentation

## 2015-09-17 DIAGNOSIS — M6282 Rhabdomyolysis: Secondary | ICD-10-CM | POA: Diagnosis not present

## 2015-09-17 DIAGNOSIS — I440 Atrioventricular block, first degree: Secondary | ICD-10-CM | POA: Diagnosis not present

## 2015-09-17 DIAGNOSIS — R0602 Shortness of breath: Secondary | ICD-10-CM | POA: Diagnosis not present

## 2015-09-17 DIAGNOSIS — M791 Myalgia: Secondary | ICD-10-CM | POA: Insufficient documentation

## 2015-09-17 LAB — URINALYSIS COMPLETE WITH MICROSCOPIC (ARMC ONLY)
BACTERIA UA: NONE SEEN
Bilirubin Urine: NEGATIVE
Glucose, UA: NEGATIVE mg/dL
Hgb urine dipstick: NEGATIVE
KETONES UR: NEGATIVE mg/dL
LEUKOCYTES UA: NEGATIVE
NITRITE: NEGATIVE
PH: 5 (ref 5.0–8.0)
PROTEIN: NEGATIVE mg/dL
SPECIFIC GRAVITY, URINE: 1.013 (ref 1.005–1.030)
SQUAMOUS EPITHELIAL / LPF: NONE SEEN

## 2015-09-17 LAB — CBC
HEMATOCRIT: 39.4 % — AB (ref 40.0–52.0)
HEMOGLOBIN: 13.6 g/dL (ref 13.0–18.0)
MCH: 31 pg (ref 26.0–34.0)
MCHC: 34.6 g/dL (ref 32.0–36.0)
MCV: 89.7 fL (ref 80.0–100.0)
Platelets: 131 10*3/uL — ABNORMAL LOW (ref 150–440)
RBC: 4.38 MIL/uL — AB (ref 4.40–5.90)
RDW: 13.9 % (ref 11.5–14.5)
WBC: 9.7 10*3/uL (ref 3.8–10.6)

## 2015-09-17 LAB — COMPREHENSIVE METABOLIC PANEL
ALK PHOS: 94 U/L (ref 38–126)
ALT: 17 U/L (ref 17–63)
AST: 21 U/L (ref 15–41)
Albumin: 3.8 g/dL (ref 3.5–5.0)
Anion gap: 5 (ref 5–15)
BUN: 19 mg/dL (ref 6–20)
CALCIUM: 8.7 mg/dL — AB (ref 8.9–10.3)
CHLORIDE: 106 mmol/L (ref 101–111)
CO2: 25 mmol/L (ref 22–32)
CREATININE: 0.83 mg/dL (ref 0.61–1.24)
GFR calc Af Amer: >60 mL/min (ref >60)
Glucose, Bld: 180 mg/dL — ABNORMAL HIGH (ref 65–99)
Potassium: 4 mmol/L (ref 3.5–5.1)
Sodium: 136 mmol/L (ref 135–145)
Total Bilirubin: 0.8 mg/dL (ref 0.3–1.2)
Total Protein: 6.2 g/dL — ABNORMAL LOW (ref 6.5–8.1)

## 2015-09-17 LAB — TROPONIN I: Troponin I: <0.03 ng/mL (ref <0.031)

## 2015-09-17 LAB — BRAIN NATRIURETIC PEPTIDE: B Natriuretic Peptide: 171 pg/mL — ABNORMAL HIGH (ref 0.0–100.0)

## 2015-09-17 LAB — CK: CK TOTAL: 26 U/L — AB (ref 49–397)

## 2015-09-17 LAB — MRSA PCR SCREENING: MRSA by PCR: NEGATIVE

## 2015-09-17 LAB — GLUCOSE, CAPILLARY: Glucose-Capillary: 197 mg/dL — ABNORMAL HIGH (ref 65–99)

## 2015-09-17 MED ORDER — SODIUM CHLORIDE 0.9% FLUSH
3.0000 mL | Freq: Two times a day (BID) | INTRAVENOUS | Status: DC
  Administered 2015-09-17 (×2): 3 mL via INTRAVENOUS

## 2015-09-17 MED ORDER — ENOXAPARIN SODIUM 40 MG/0.4ML ~~LOC~~ SOLN
40.0000 mg | SUBCUTANEOUS | Status: DC
Start: 2015-09-17 — End: 2015-09-18
  Administered 2015-09-17 – 2015-09-18 (×2): 40 mg via SUBCUTANEOUS
  Filled 2015-09-17 (×2): qty 0.4

## 2015-09-17 MED ORDER — BRIMONIDINE TARTRATE 0.2 % OP SOLN
1.0000 [drp] | Freq: Two times a day (BID) | OPHTHALMIC | Status: DC
  Administered 2015-09-17 – 2015-09-18 (×3): 1 [drp] via OPHTHALMIC
  Filled 2015-09-17: qty 5

## 2015-09-17 MED ORDER — NYSTATIN 100000 UNIT/GM EX POWD
1.0000 g | Freq: Two times a day (BID) | CUTANEOUS | Status: DC | PRN
  Filled 2015-09-17: qty 15

## 2015-09-17 MED ORDER — ALUM & MAG HYDROXIDE-SIMETH 200-200-20 MG/5ML PO SUSP: 30.0000 mL | Freq: Four times a day (QID) | ORAL | Status: DC | PRN

## 2015-09-17 MED ORDER — METAXALONE 400 MG HALF TABLET
400.0000 mg | ORAL_TABLET | Freq: Once | ORAL | Status: AC
  Administered 2015-09-17: 400 mg via ORAL
  Filled 2015-09-17 (×2): qty 1

## 2015-09-17 MED ORDER — SODIUM CHLORIDE 0.9% FLUSH: 3.0000 mL | INTRAVENOUS | Status: DC | PRN

## 2015-09-17 MED ORDER — ASPIRIN 300 MG RE SUPP
300.0000 mg | RECTAL | Status: AC
  Filled 2015-09-17: qty 1

## 2015-09-17 MED ORDER — ASPIRIN 81 MG PO CHEW
324.0000 mg | CHEWABLE_TABLET | Freq: Once | ORAL | Status: AC
  Administered 2015-09-17: 324 mg via ORAL
  Filled 2015-09-17: qty 4

## 2015-09-17 MED ORDER — ASPIRIN 81 MG PO CHEW: 324.0000 mg | CHEWABLE_TABLET | ORAL | Status: AC

## 2015-09-17 MED ORDER — LEVOTHYROXINE SODIUM 75 MCG PO TABS
75.0000 ug | ORAL_TABLET | Freq: Every day | ORAL | Status: DC
  Administered 2015-09-17 – 2015-09-18 (×2): 75 ug via ORAL
  Filled 2015-09-17 (×3): qty 1

## 2015-09-17 MED ORDER — NITROGLYCERIN 0.4 MG SL SUBL: 0.4000 mg | SUBLINGUAL_TABLET | SUBLINGUAL | Status: DC | PRN

## 2015-09-17 MED ORDER — VITAMIN D 1000 UNITS PO TABS
2000.0000 [IU] | ORAL_TABLET | Freq: Every day | ORAL | Status: DC
  Administered 2015-09-17 – 2015-09-18 (×2): 2000 [IU] via ORAL
  Filled 2015-09-17 (×2): qty 2

## 2015-09-17 MED ORDER — GLIPIZIDE 5 MG PO TABS
5.0000 mg | ORAL_TABLET | Freq: Two times a day (BID) | ORAL | Status: DC
  Administered 2015-09-17 – 2015-09-18 (×3): 5 mg via ORAL
  Filled 2015-09-17 (×3): qty 1

## 2015-09-17 MED ORDER — TAMSULOSIN HCL 0.4 MG PO CAPS
0.4000 mg | ORAL_CAPSULE | Freq: Every day | ORAL | Status: DC
  Administered 2015-09-17: 0.4 mg via ORAL
  Filled 2015-09-17: qty 1

## 2015-09-17 MED ORDER — PANTOPRAZOLE SODIUM 40 MG PO TBEC
40.0000 mg | DELAYED_RELEASE_TABLET | Freq: Every day | ORAL | Status: DC
  Administered 2015-09-17 – 2015-09-18 (×2): 40 mg via ORAL
  Filled 2015-09-17 (×2): qty 1

## 2015-09-17 MED ORDER — ASPIRIN EC 81 MG PO TBEC: 81.0000 mg | DELAYED_RELEASE_TABLET | Freq: Every day | ORAL | Status: DC

## 2015-09-17 MED ORDER — FINASTERIDE 5 MG PO TABS
5.0000 mg | ORAL_TABLET | Freq: Every day | ORAL | Status: DC
  Administered 2015-09-17 – 2015-09-18 (×2): 5 mg via ORAL
  Filled 2015-09-17 (×2): qty 1

## 2015-09-17 MED ORDER — BUSPIRONE HCL 5 MG PO TABS
15.0000 mg | ORAL_TABLET | Freq: Three times a day (TID) | ORAL | Status: DC
  Administered 2015-09-17 – 2015-09-18 (×4): 15 mg via ORAL
  Filled 2015-09-17 (×4): qty 3

## 2015-09-17 MED ORDER — LOSARTAN POTASSIUM 50 MG PO TABS
50.0000 mg | ORAL_TABLET | Freq: Two times a day (BID) | ORAL | Status: DC
  Administered 2015-09-17 – 2015-09-18 (×3): 50 mg via ORAL
  Filled 2015-09-17 (×3): qty 1

## 2015-09-17 MED ORDER — LEVOFLOXACIN 500 MG PO TABS
500.0000 mg | ORAL_TABLET | Freq: Every day | ORAL | Status: DC
  Administered 2015-09-17 – 2015-09-18 (×2): 500 mg via ORAL
  Filled 2015-09-17 (×2): qty 1

## 2015-09-17 MED ORDER — ATORVASTATIN CALCIUM 20 MG PO TABS
20.0000 mg | ORAL_TABLET | Freq: Every day | ORAL | Status: DC
  Administered 2015-09-17 – 2015-09-18 (×2): 20 mg via ORAL
  Filled 2015-09-17 (×2): qty 1

## 2015-09-17 MED ORDER — CLOPIDOGREL BISULFATE 75 MG PO TABS
75.0000 mg | ORAL_TABLET | Freq: Every day | ORAL | Status: DC
  Administered 2015-09-17: 75 mg via ORAL
  Filled 2015-09-17: qty 1

## 2015-09-17 MED ORDER — LATANOPROST 0.005 % OP SOLN
1.0000 [drp] | Freq: Every day | OPHTHALMIC | Status: DC
  Administered 2015-09-17: 1 [drp] via OPHTHALMIC
  Filled 2015-09-17: qty 2.5

## 2015-09-17 MED ORDER — CLONIDINE HCL 0.1 MG PO TABS
0.1000 mg | ORAL_TABLET | Freq: Two times a day (BID) | ORAL | Status: DC
  Administered 2015-09-17 – 2015-09-18 (×3): 0.1 mg via ORAL
  Filled 2015-09-17 (×3): qty 1

## 2015-09-17 MED ORDER — GUAIFENESIN-DM 100-10 MG/5ML PO SYRP: 5.0000 mL | ORAL_SOLUTION | ORAL | Status: DC | PRN

## 2015-09-17 MED ORDER — METOPROLOL TARTRATE 25 MG PO TABS
25.0000 mg | ORAL_TABLET | Freq: Two times a day (BID) | ORAL | Status: DC
  Administered 2015-09-17 – 2015-09-18 (×3): 25 mg via ORAL
  Filled 2015-09-17 (×3): qty 1

## 2015-09-17 MED ORDER — QUETIAPINE FUMARATE 25 MG PO TABS
25.0000 mg | ORAL_TABLET | Freq: Every evening | ORAL | Status: DC | PRN
  Administered 2015-09-17: 25 mg via ORAL
  Filled 2015-09-17: qty 2

## 2015-09-17 MED ORDER — OCUVITE-LUTEIN PO CAPS
1.0000 | ORAL_CAPSULE | Freq: Two times a day (BID) | ORAL | Status: DC
  Administered 2015-09-17 (×2): 1 via ORAL
  Filled 2015-09-17 (×3): qty 1

## 2015-09-17 MED ORDER — AMITRIPTYLINE HCL 25 MG PO TABS
25.0000 mg | ORAL_TABLET | Freq: Every day | ORAL | Status: DC
Start: 2015-09-17 — End: 2015-09-18
  Administered 2015-09-17: 25 mg via ORAL
  Filled 2015-09-17: qty 1

## 2015-09-17 MED ORDER — ONDANSETRON HCL 4 MG/2ML IJ SOLN: 4.0000 mg | Freq: Four times a day (QID) | INTRAMUSCULAR | Status: DC | PRN

## 2015-09-17 MED ORDER — ASPIRIN EC 81 MG PO TBEC
81.0000 mg | DELAYED_RELEASE_TABLET | Freq: Every day | ORAL | Status: DC
  Administered 2015-09-17 – 2015-09-18 (×2): 81 mg via ORAL
  Filled 2015-09-17 (×2): qty 1

## 2015-09-17 MED ORDER — VITAMIN B-12 1000 MCG PO TABS
1000.0000 ug | ORAL_TABLET | Freq: Every day | ORAL | Status: DC
  Administered 2015-09-17: 1000 ug via ORAL
  Filled 2015-09-17: qty 1

## 2015-09-17 MED ORDER — NITROGLYCERIN 0.4 MG SL SUBL
0.4000 mg | SUBLINGUAL_TABLET | Freq: Once | SUBLINGUAL | Status: AC
  Administered 2015-09-17: 0.4 mg via SUBLINGUAL
  Filled 2015-09-17: qty 1

## 2015-09-17 MED ORDER — DORZOLAMIDE HCL-TIMOLOL MAL 2-0.5 % OP SOLN
1.0000 [drp] | Freq: Two times a day (BID) | OPHTHALMIC | Status: DC
  Administered 2015-09-17 – 2015-09-18 (×3): 1 [drp] via OPHTHALMIC
  Filled 2015-09-17: qty 10

## 2015-09-17 MED ORDER — SODIUM CHLORIDE 0.9 % IV SOLN: 250.0000 mL | INTRAVENOUS | Status: DC | PRN

## 2015-09-17 NOTE — ED Notes (Signed)
X-ray at bedside at this time.

## 2015-09-17 NOTE — ED Notes (Signed)
Pt presents to ED with c/o chest pain and SOB, feels like indigestion since yesterday around lunch yesterday. Pt was in rehab for weakness and multiple falls. Pt alerts and oriented x4 at this time, airway intact.

## 2015-09-17 NOTE — ED Notes (Signed)
Dr. Pyreddy at bedside  

## 2015-09-17 NOTE — ED Provider Notes (Addendum)
[facility] Emergency Department Provider Note  ____________________________________________  Time seen: 2:30 AM  I have reviewed the triage vital signs and the nursing notes.   HISTORY  Chief Complaint Chest Pain      HPI [place] Chad Rollins is a 80 y.o. male presents with nonradiating left-sided chest pain described as aching indigestion-like since yesterday afternoon. Patient denies any shortness of breath no nausea vomiting dizziness or palpitations. Of note patient recently seen in the emergency department secondary to fall with rhabdomyolysis. She states current pain score 7 out of 10. Patient denies any aggravating or alleviating factors     Past Medical History  Diagnosis Date  . Rheumatoid arthritis ([place])   . Stroke ([place])   . Peripheral neuropathy ([place])   . Diabetes mellitus ([place])   . HLD (hyperlipidemia)   . Allergic rhinitis   . Aortic stenosis   . Hypothyroidism   . Injury of right rotator cuff   . Osteoarthritis   . Obesity   . Continuous right lower quadrant pain   . BPH with obstruction/lower urinary tract symptoms   . HTN (hypertension)     Patient Active Problem List   Diagnosis Date Noted  . Elevated troponin 08/24/2015  . BPH with obstruction/lower urinary tract symptoms 01/21/2015  . Right lower quadrant abdominal pain 01/21/2015    Past Surgical History  Procedure Laterality Date  . Cholecystectomy    . Surgery for sleep apnea    . Lipoma excisions    . Tonsillectomy      Current Outpatient Rx  Name  Route  Sig  Dispense  Refill  . amitriptyline (ELAVIL) 25 MG tablet   Oral   Take 25 mg by mouth at bedtime.         Marland Kitchen aspirin EC 81 MG tablet   Oral   Take 81 mg by mouth daily.         Marland Kitchen atorvastatin (LIPITOR) 20 MG tablet   Oral   Take 20 mg by mouth daily.          . brimonidine (ALPHAGAN) 0.2 % ophthalmic solution   Left Eye   Place 1 drop into the left eye 2 (two) times daily.         .  busPIRone (BUSPAR) 15 MG tablet   Oral   Take 15 mg by mouth 3 (three) times daily.          . cholecalciferol (VITAMIN D) 1000 units tablet   Oral   Take 2,000 Units by mouth daily.         . cloNIDine (CATAPRES) 0.1 MG tablet   Oral   Take 0.1 mg by mouth 2 (two) times daily.         . clopidogrel (PLAVIX) 75 MG tablet   Oral   Take 75 mg by mouth at bedtime.          . dorzolamide-timolol (COSOPT) 22.3-6.8 MG/ML ophthalmic solution   Both Eyes   Place 1 drop into both eyes 2 (two) times daily.         . finasteride (PROSCAR) 5 MG tablet   Oral   Take 1 tablet (5 mg total) by mouth daily.   30 tablet   3   . glipiZIDE (GLUCOTROL) 5 MG tablet   Oral   Take 5 mg by mouth 2 (two) times daily before a meal.          . guaiFENesin-dextromethorphan (ROBITUSSIN DM) 100-10 MG/5ML syrup  Oral   Take 5 mLs by mouth every 4 (four) hours as needed for cough.   118 mL   0   . latanoprost (XALATAN) 0.005 % ophthalmic solution   Both Eyes   Place 1 drop into both eyes at bedtime.          Marland Kitchen levothyroxine (SYNTHROID, LEVOTHROID) 75 MCG tablet   Oral   Take 75 mcg by mouth daily before breakfast.         . losartan (COZAAR) 50 MG tablet   Oral   Take 50 mg by mouth 2 (two) times daily.          . metFORMIN (GLUCOPHAGE) 1000 MG tablet   Oral   Take 1,000 mg by mouth 2 (two) times daily with a meal.         . metoprolol tartrate (LOPRESSOR) 25 MG tablet   Oral   Take 25 mg by mouth 2 (two) times daily.          . Multiple Vitamins-Minerals (PRESERVISION AREDS 2) CAPS   Oral   Take 1 capsule by mouth 2 (two) times daily.         Marland Kitchen nystatin (MYCOSTATIN) powder   Topical   Apply 1 g topically 2 (two) times daily as needed (for rash).          . QUEtiapine (SEROQUEL) 25 MG tablet   Oral   Take 25-50 mg by mouth at bedtime as needed (for sleep).         . tamsulosin (FLOMAX) 0.4 MG CAPS capsule   Oral   Take 0.4 mg by mouth daily after  supper.          . vitamin B-12 (CYANOCOBALAMIN) 1000 MCG tablet   Oral   Take 1,000 mcg by mouth at bedtime.           Allergies Tylenol  Family History  Problem Relation Age of Onset  . Prostate cancer Neg Hx   . Bladder Cancer Neg Hx   . Kidney disease Neg Hx     Social History Social History  Substance Use Topics  . Smoking status: Former Research scientist (life sciences)  . Smokeless tobacco: None     Comment: quit 1975  . Alcohol Use: No    Review of Systems  Constitutional: Negative for fever. Eyes: Negative for visual changes. ENT: Negative for sore throat. Cardiovascular:Positive for chest pain. Respiratory: Negative for shortness of breath. Gastrointestinal: Negative for abdominal pain, vomiting and diarrhea. Genitourinary: Negative for dysuria. Musculoskeletal: Negative for back pain. Skin: Negative for rash. Neurological: Negative for headaches, focal weakness or numbness.   10-point ROS otherwise negative.  ____________________________________________   PHYSICAL EXAM:  VITAL SIGNS: ED Triage Vitals  Enc Vitals Group     BP 09/17/15 0234 184/84 mmHg     Pulse Rate 09/17/15 0234 88     Resp 09/17/15 0234 12     Temp 09/17/15 0234 98.1 F (36.7 C)     Temp Source 09/17/15 0234 Oral     SpO2 09/17/15 0234 99 %     Weight --      Height --      Head Cir --      Peak Flow --      Pain Score 09/17/15 0224 8     Pain Loc --      Pain Edu? --      Excl. in [employer]? --      Constitutional: Alert and oriented. Well appearing and in  no distress. Eyes: Conjunctivae are normal. PERRL. Normal extraocular movements. ENT   Head: Normocephalic and atraumatic.   Nose: No congestion/rhinnorhea.   Mouth/Throat: Mucous membranes are moist.   Neck: No stridor. Hematological/Lymphatic/Immunilogical: No cervical lymphadenopathy. Cardiovascular: Normal rate, regular rhythm. Normal and symmetric distal pulses are present in all extremities. Systolic ejection  murmur Respiratory: Normal respiratory effort without tachypnea nor retractions. Breath sounds are clear and equal bilaterally. No wheezes/rales/rhonchi. Gastrointestinal: Soft and nontender. No distention. There is no CVA tenderness. Genitourinary: deferred Musculoskeletal: Nontender with normal range of motion in all extremities. No joint effusions.  No lower extremity tenderness nor edema. Neurologic:  Normal speech and language. No gross focal neurologic deficits are appreciated. Speech is normal.  Skin:  Skin is warm, dry and intact. No rash noted. Psychiatric: Mood and affect are normal. Speech and behavior are normal. Patient exhibits appropriate insight and judgment.  ____________________________________________    LABS (pertinent positives/negatives)  Labs Reviewed  CBC - Abnormal; Notable for the following:    RBC 4.38 (*)    HCT 39.4 (*)    Platelets 131 (*)    All other components within normal limits  COMPREHENSIVE METABOLIC PANEL - Abnormal; Notable for the following:    Glucose, Bld 180 (*)    Calcium 8.7 (*)    Total Protein 6.2 (*)    All other components within normal limits  BRAIN NATRIURETIC PEPTIDE - Abnormal; Notable for the following:    B Natriuretic Peptide 171.0 (*)    All other components within normal limits  CK - Abnormal; Notable for the following:    Total CK 26 (*)    All other components within normal limits  TROPONIN I  URINALYSIS COMPLETEWITH MICROSCOPIC ([facility] ONLY)     ____________________________________________   EKG  ED ECG REPORT I, Marion N Xavier Munger, the attending physician, personally viewed and interpreted this ECG.   Date: 09/17/2015  EKG Time: 2:22 AM  Rate: 88  Rhythm: Normal sinus rhythm  Axis: none  Intervals: Normal  ST&T Change: Acute anterior septal T waves   ____________________________________________    RADIOLOGY  DG Chest Portable 1 View (Final result) Result time: 09/17/15 03:04:53   Final result by  Rad Results In Interface (09/17/15 03:04:53)   Narrative:   CLINICAL DATA: Acute onset of generalized chest pain and shortness of breath. Initial encounter.  EXAM: PORTABLE CHEST 1 VIEW  COMPARISON: Chest radiograph performed 08/24/2015  FINDINGS: The lungs are well-aerated. Mild bibasilar opacities may reflect mild pneumonia. There is no evidence of pleural effusion or pneumothorax.  The cardiomediastinal silhouette is borderline normal in size. No acute osseous abnormalities are seen.  IMPRESSION: Mild bibasilar opacities may reflect mild pneumonia.   Electronically Signed By: Garald Balding M.D. On: 09/17/2015 03:04      INITIAL IMPRESSION / ASSESSMENT AND PLAN / ED COURSE  Pertinent labs & imaging results that were available during my care of the patient were reviewed by me and considered in my medical decision making (see chart for details).  Concern for possible cardiac etiology of the patient's chest pain as such aspirin 324 mg given as well as nitroglycerin sublingual 1. Patient discussed with Dr. Hedda Slade admission for further evaluation and management.  ____________________________________________   FINAL CLINICAL IMPRESSION(S) / ED DIAGNOSES  Final diagnoses:  Chest pain, unspecified chest pain type      Gregor Hams, MD 09/17/15 [place], MD 09/17/15 [phone]

## 2015-09-17 NOTE — Progress Notes (Signed)
[facility] [place] NAME: Chad Rollins    MR#:  [id number]  DATE OF BIRTH:  [birth year]  SUBJECTIVE:  CHIEF COMPLAINT:   Chief Complaint  Patient presents with  . Chest Pain   - patient admitted from home due to chest pain - right sided muscular pain now. Also complained of heart burn symptoms - recent discharge to home from rehab. Using walker now to ambulate. Care giver at bedside -Patient is legally blind  REVIEW OF SYSTEMS:  Review of Systems  Constitutional: Negative for fever and chills.  HENT: Negative for ear discharge, nosebleeds and tinnitus.   Eyes: Negative for blurred vision and double vision.  Respiratory: Negative for cough, shortness of breath and wheezing.   Cardiovascular: Positive for chest pain and leg swelling. Negative for palpitations.  Gastrointestinal: Negative for nausea, vomiting, abdominal pain, diarrhea and constipation.  Genitourinary: Negative for dysuria and hematuria.  Musculoskeletal: Positive for back pain. Negative for myalgias and neck pain.  Neurological: Positive for weakness. Negative for dizziness, tremors, sensory change, speech change, focal weakness, seizures and headaches.    DRUG ALLERGIES:   Allergies  Allergen Reactions  . Tylenol [Acetaminophen] Other (See Comments)    Reaction:  Unknown     VITALS:  Blood pressure 150/60, pulse 75, temperature 97.5 F (36.4 C), temperature source Oral, resp. rate 20, weight 95.437 kg (210 lb 6.4 oz), SpO2 99 %.  PHYSICAL EXAMINATION:  Physical Exam  GENERAL:  80 y.o.-year-old patient lying in the bed with no acute distress.  EYES: Pupils equal, round, reactive to light and accommodation. No scleral icterus. Extraocular muscles intact.  HEENT: Head atraumatic, normocephalic. Oropharynx and nasopharynx clear.  NECK:  Supple, no jugular venous distention. No thyroid enlargement, no tenderness.  LUNGS: Normal breath sounds bilaterally, no  wheezing, rales,rhonchi or crepitation. No use of accessory muscles of respiration.  CARDIOVASCULAR: S1, S2 normal. No  rubs, or gallops. 3/6 systolic murmur present. ABDOMEN: Soft, nontender, nondistended. Bowel sounds present. No organomegaly or mass.  EXTREMITIES: No pedal edema, cyanosis, or clubbing.  NEUROLOGIC: Cranial nerves II through XII are intact. Muscle strength 5/5 in all extremities. Sensation intact. Gait not checked.  PSYCHIATRIC: The patient is alert and oriented x 3.  SKIN: No obvious rash, lesion, or ulcer.    LABORATORY PANEL:   CBC  Recent Labs Lab 09/17/15 0230  WBC 9.7  HGB 13.6  HCT 39.4*  PLT 131*   ------------------------------------------------------------------------------------------------------------------  Chemistries   Recent Labs Lab 09/17/15 0230  NA 136  K 4.0  CL 106  CO2 25  GLUCOSE 180*  BUN 19  CREATININE 0.83  CALCIUM 8.7*  AST 21  ALT 17  ALKPHOS 94  BILITOT 0.8   ------------------------------------------------------------------------------------------------------------------  Cardiac Enzymes  Recent Labs Lab 09/17/15 1043  TROPONINI <0.03   ------------------------------------------------------------------------------------------------------------------  RADIOLOGY:  Dg Chest Portable 1 View  09/17/2015  CLINICAL DATA:  Acute onset of generalized chest pain and shortness of breath. Initial encounter. EXAM: PORTABLE CHEST 1 VIEW COMPARISON:  Chest radiograph performed 08/24/2015 FINDINGS: The lungs are well-aerated. Mild bibasilar opacities may reflect mild pneumonia. There is no evidence of pleural effusion or pneumothorax. The cardiomediastinal silhouette is borderline normal in size. No acute osseous abnormalities are seen. IMPRESSION: Mild bibasilar opacities may reflect mild pneumonia. Electronically Signed   By: Garald Balding M.D.   On: 09/17/2015 03:04    EKG:   Orders placed or performed during the hospital  encounter of 09/17/15  .  EKG 12-Lead  . EKG 12-Lead  . EKG 12-Lead  . EKG 12-Lead  . ED EKG  . ED EKG    ASSESSMENT AND PLAN:   80 year old male with past medical history significant for neuropathy, rheumatoid arthritis, diabetes, CVA, aortic stenosis and arthritis presents to the hospital secondary to chest pain  #1 chest pain-likely musculoskeletal chest pain with slight pleuritic competent to it. -Monitor on telemetry. Troponins are negative 3. -Chest x-ray with bibasilar atelectasis versus infiltrate. -Continue Levaquin that was started on admission. -Echocardiogram is done and pending.  -added muscle relaxant and monitor.  #2 GERD with significant heartburn symptoms-added Protonix. Also Maalox as needed  #3 hypertension-continue Cozaar, clonidine and metoprolol.  #4 diabetes mellitus-metformin is currently on hold. Continue glipizide.  #5 depression and anxiety-continue home medications.  #6 DVT prophylaxis-on Lovenox.  Physical therapy has been consulted.    All the records are reviewed and case discussed with Care Management/Social Workerr. Management plans discussed with the patient, family and they are in agreement.  CODE STATUS: Full Code  TOTAL TIME TAKING CARE OF THIS PATIENT: 38 minutes.   POSSIBLE D/C IN 1-2 DAYS, DEPENDING ON CLINICAL CONDITION.   Gladstone Lighter M.D on 09/17/2015 at 4:07 PM  Between 7am to 6pm - Pager - [phone]  After 6pm go to [email or web address] - password [email or web address] [facility]  Office  [phone]  CC: Primary care physician; Idelle Crouch, MD

## 2015-09-17 NOTE — Progress Notes (Signed)
*  PRELIMINARY RESULTS* Echocardiogram 2D Echocardiogram has been performed.  Laqueta Jean Hege 09/17/2015, 2:46 PM

## 2015-09-17 NOTE — H&P (Signed)
[facility] NAME: Chad Rollins    MR#:  [id number]  DATE OF BIRTH:  [birth year]  DATE OF ADMISSION:  09/17/2015  PRIMARY CARE PHYSICIAN: SPARKS,JEFFREY D, MD   REQUESTING/REFERRING PHYSICIAN:   CHIEF COMPLAINT:   Chief Complaint  Patient presents with  . Chest Pain    HISTORY OF PRESENT ILLNESS: Chad Rollins  is a 80 y.o. male with a known history of rheumatoid arthritis, peripheral neuropathy, diabetes mellitus, CVA, hyperlipidemia, aortic stenosis, osteoarthritis presented to the emergency room with chest pain. The pain started yesterday afternoon after having lunch. The chest pain gets aggravated on deep breathing. Patient was recently discharged from rehabilitation facility 2 days ago to home. He was at the rehabilitation facility for 3 weeks secondary to fall for gait and balance training. The chest pain is resolved in the emergency room. No history of any shortness of breath. No fever or chills or cough. First set of troponin was negative and EKG was within normal limits.No history of orthopnea.  PAST MEDICAL HISTORY:   Past Medical History  Diagnosis Date  . Rheumatoid arthritis ([place])   . Stroke ([place])   . Peripheral neuropathy ([place])   . Diabetes mellitus ([place])   . HLD (hyperlipidemia)   . Allergic rhinitis   . Aortic stenosis   . Hypothyroidism   . Injury of right rotator cuff   . Osteoarthritis   . Obesity   . Continuous right lower quadrant pain   . BPH with obstruction/lower urinary tract symptoms   . HTN (hypertension)     PAST SURGICAL HISTORY: Past Surgical History  Procedure Laterality Date  . Cholecystectomy    . Surgery for sleep apnea    . Lipoma excisions    . Tonsillectomy      SOCIAL HISTORY:  Social History  Substance Use Topics  . Smoking status: Former Research scientist (life sciences)  . Smokeless tobacco: Not on file     Comment: quit 1975  . Alcohol Use: No    FAMILY HISTORY:  Family History  Problem  Relation Age of Onset  . Prostate cancer Neg Hx   . Bladder Cancer Neg Hx   . Kidney disease Neg Hx     DRUG ALLERGIES:  Allergies  Allergen Reactions  . Tylenol [Acetaminophen] Other (See Comments)    Reaction:  Unknown     REVIEW OF SYSTEMS:   CONSTITUTIONAL: No fever, fatigue or weakness.  EYES: No blurred or double vision.  EARS, NOSE, AND THROAT: No tinnitus or ear pain.  RESPIRATORY: No cough, shortness of breath, wheezing or hemoptysis.  CARDIOVASCULAR: Has chest pain, no orthopnea, edema.  GASTROINTESTINAL: No nausea, vomiting, diarrhea or abdominal pain.  GENITOURINARY: No dysuria, hematuria.  ENDOCRINE: No polyuria, nocturia,  HEMATOLOGY: No anemia, easy bruising or bleeding SKIN: No rash or lesion. MUSCULOSKELETAL: No joint pain or arthritis.   NEUROLOGIC: No tingling, numbness, weakness.  PSYCHIATRY: No anxiety or depression.   MEDICATIONS AT HOME:  Prior to Admission medications   Medication Sig Start Date End Date Taking? Authorizing Provider  amitriptyline (ELAVIL) 25 MG tablet Take 25 mg by mouth at bedtime.   Yes Historical Provider, MD  aspirin EC 81 MG tablet Take 81 mg by mouth daily.   Yes Historical Provider, MD  atorvastatin (LIPITOR) 20 MG tablet Take 20 mg by mouth daily.    Yes Historical Provider, MD  brimonidine (ALPHAGAN) 0.2 % ophthalmic solution Place 1 drop into the left eye 2 (two)  times daily.   Yes Historical Provider, MD  busPIRone (BUSPAR) 15 MG tablet Take 15 mg by mouth 3 (three) times daily.    Yes Historical Provider, MD  cholecalciferol (VITAMIN D) 1000 units tablet Take 2,000 Units by mouth daily.   Yes Historical Provider, MD  cloNIDine (CATAPRES) 0.1 MG tablet Take 0.1 mg by mouth 2 (two) times daily.   Yes Historical Provider, MD  clopidogrel (PLAVIX) 75 MG tablet Take 75 mg by mouth at bedtime.    Yes Historical Provider, MD  dorzolamide-timolol (COSOPT) 22.3-6.8 MG/ML ophthalmic solution Place 1 drop into both eyes 2 (two) times  daily.   Yes Historical Provider, MD  finasteride (PROSCAR) 5 MG tablet Take 1 tablet (5 mg total) by mouth daily. 05/06/15  Yes Shannon A McGowan, PA-C  glipiZIDE (GLUCOTROL) 5 MG tablet Take 5 mg by mouth 2 (two) times daily before a meal.    Yes Historical Provider, MD  guaiFENesin-dextromethorphan (ROBITUSSIN DM) 100-10 MG/5ML syrup Take 5 mLs by mouth every 4 (four) hours as needed for cough. 08/26/15  Yes Sital Mody, MD  latanoprost (XALATAN) 0.005 % ophthalmic solution Place 1 drop into both eyes at bedtime.    Yes Historical Provider, MD  levothyroxine (SYNTHROID, LEVOTHROID) 75 MCG tablet Take 75 mcg by mouth daily before breakfast.   Yes Historical Provider, MD  losartan (COZAAR) 50 MG tablet Take 50 mg by mouth 2 (two) times daily.    Yes Historical Provider, MD  metFORMIN (GLUCOPHAGE) 1000 MG tablet Take 1,000 mg by mouth 2 (two) times daily with a meal.   Yes Historical Provider, MD  metoprolol tartrate (LOPRESSOR) 25 MG tablet Take 25 mg by mouth 2 (two) times daily.    Yes Historical Provider, MD  Multiple Vitamins-Minerals (PRESERVISION AREDS 2) CAPS Take 1 capsule by mouth 2 (two) times daily.   Yes Historical Provider, MD  nystatin (MYCOSTATIN) powder Apply 1 g topically 2 (two) times daily as needed (for rash).    Yes Historical Provider, MD  QUEtiapine (SEROQUEL) 25 MG tablet Take 25-50 mg by mouth at bedtime as needed (for sleep).   Yes Historical Provider, MD  tamsulosin (FLOMAX) 0.4 MG CAPS capsule Take 0.4 mg by mouth daily after supper.  07/14/14  Yes Historical Provider, MD  vitamin B-12 (CYANOCOBALAMIN) 1000 MCG tablet Take 1,000 mcg by mouth at bedtime.   Yes Historical Provider, MD      PHYSICAL EXAMINATION:   VITAL SIGNS: Blood pressure 157/68, pulse 71, temperature 98.1 F (36.7 C), temperature source Oral, resp. rate 20, SpO2 95 %.  GENERAL:  80 y.o.-year-old patient lying in the bed with no acute distress.  EYES: Pupils equal, round, reactive to light and  accommodation. No scleral icterus. Extraocular muscles intact.  HEENT: Head atraumatic, normocephalic. Oropharynx and nasopharynx clear.  NECK:  Supple, no jugular venous distention. No thyroid enlargement, no tenderness.  LUNGS: Normal breath sounds bilaterally, no wheezing, rales,rhonchi or crepitation. No use of accessory muscles of respiration.  CARDIOVASCULAR: S1, S2 normal. No murmurs, rubs, or gallops.  ABDOMEN: Soft, nontender, nondistended. Bowel sounds present. No organomegaly or mass.  EXTREMITIES: No pedal edema, cyanosis, or clubbing.  NEUROLOGIC: Cranial nerves II through XII are intact. Muscle strength 5/5 in all extremities. Sensation intact. Gait not checked. No cerebellar signs noted. PSYCHIATRIC: The patient is alert and oriented x 3.  SKIN: No obvious rash, lesion, or ulcer.   LABORATORY PANEL:   CBC  Recent Labs Lab 09/17/15 0230  WBC 9.7  HGB 13.6  HCT 39.4*  PLT 131*  MCV 89.7  MCH 31.0  MCHC 34.6  RDW 13.9   ------------------------------------------------------------------------------------------------------------------  Chemistries   Recent Labs Lab 09/17/15 0230  NA 136  K 4.0  CL 106  CO2 25  GLUCOSE 180*  BUN 19  CREATININE 0.83  CALCIUM 8.7*  AST 21  ALT 17  ALKPHOS 94  BILITOT 0.8   ------------------------------------------------------------------------------------------------------------------ CrCl cannot be calculated (Unknown ideal weight.). ------------------------------------------------------------------------------------------------------------------ No results for input(s): TSH, T4TOTAL, T3FREE, THYROIDAB in the last 72 hours.  Invalid input(s): FREET3   Coagulation profile No results for input(s): INR, PROTIME in the last 168 hours. ------------------------------------------------------------------------------------------------------------------- No results for input(s): DDIMER in the last 72  hours. -------------------------------------------------------------------------------------------------------------------  Cardiac Enzymes  Recent Labs Lab 09/17/15 0230  TROPONINI <0.03   ------------------------------------------------------------------------------------------------------------------ Invalid input(s): POCBNP  ---------------------------------------------------------------------------------------------------------------  Urinalysis    Component Value Date/Time   COLORURINE YELLOW* 08/24/2015 1445   COLORURINE Yellow 08/07/2014 2156   APPEARANCEUR HAZY* 08/24/2015 1445   APPEARANCEUR Clear 08/07/2014 2156   LABSPEC 1.021 08/24/2015 1445   LABSPEC 1.025 08/07/2014 2156   PHURINE 5.0 08/24/2015 1445   PHURINE 5.0 08/07/2014 2156   GLUCOSEU >500* 08/24/2015 1445   GLUCOSEU >=500 08/07/2014 2156   HGBUR 1+* 08/24/2015 1445   HGBUR Negative 08/07/2014 2156   BILIRUBINUR NEGATIVE 08/24/2015 1445   BILIRUBINUR Negative 08/07/2014 2156   KETONESUR 1+* 08/24/2015 1445   KETONESUR 1+ 08/07/2014 2156   PROTEINUR 100* 08/24/2015 1445   PROTEINUR 100 mg/dL 08/07/2014 2156   NITRITE NEGATIVE 08/24/2015 1445   NITRITE Negative 08/07/2014 2156   LEUKOCYTESUR NEGATIVE 08/24/2015 1445   LEUKOCYTESUR Negative 08/07/2014 2156     RADIOLOGY: Dg Chest Portable 1 View  09/17/2015  CLINICAL DATA:  Acute onset of generalized chest pain and shortness of breath. Initial encounter. EXAM: PORTABLE CHEST 1 VIEW COMPARISON:  Chest radiograph performed 08/24/2015 FINDINGS: The lungs are well-aerated. Mild bibasilar opacities may reflect mild pneumonia. There is no evidence of pleural effusion or pneumothorax. The cardiomediastinal silhouette is borderline normal in size. No acute osseous abnormalities are seen. IMPRESSION: Mild bibasilar opacities may reflect mild pneumonia. Electronically Signed   By: Garald Balding M.D.   On: 09/17/2015 03:04    EKG: Orders placed or performed  during the hospital encounter of 09/17/15  . EKG 12-Lead  . EKG 12-Lead  . EKG 12-Lead  . EKG 12-Lead  . ED EKG  . ED EKG    IMPRESSION AND PLAN: 80 year old male patient with history of rheumatoid arthritis, hypertension, diabetes mellitus presented to the emergency room with chest pain. Admitting diagnosis 1.Chest pain rule out acute coronary syndrome 2. Hypertension 3. Type 2 diabetes mellitus 4. Hyperlipidemia 5.Acute bronchitis versus early pneumonia Treatment plan : Admit patient to telemetry observation bed Start patient on aspirin and Plavix DVT prophylaxis with subcutaneous Lovenox Cycle troponin to rule out ischemia Check echocardiogram Cardiology Consultation Start patient on oral levaquin antibitotic. .  All the records are reviewed and case discussed with ED provider. Management plans discussed with the patient, family and they are in agreement.  CODE STATUS:FULL Code Status History    Date Active Date Inactive Code Status Order ID Comments User Context   08/24/2015  7:55 PM 08/26/2015  5:46 PM Full Code [id number]  Bettey Costa, MD Inpatient       TOTAL TIME TAKING CARE OF THIS PATIENT: 50 minutes.    Saundra Shelling M.D on 09/17/2015 at 5:18 AM  Between 7am to 6pm - Pager - [phone]  After 6pm go to [email or web address] - password [email or web address] [facility]  Office  [phone]  CC: Primary care physician; Idelle Crouch, MD

## 2015-09-17 NOTE — Consult Note (Signed)
Reason for Consult: Chest pain possible angina Referring Physician: Dr. Georgie Chard, Dr. Leroy Libman hospitalist  Chad Rollins is an 80 y.o. male.  HPI: Patient presents with a history of rheumatoid arthritis peripheral neuropathy diabetes CVA hyperlipidemia aortic stenosis and osteoarthritis presented with chest pain symptoms. Patient complains of chest discomfort with deep inspiration he was recently discharged from rehabilitation a few days ago rehabilitation after a fall to help with balance and gait training. Patient's troponins but negative still has some vague chest pain now here for further cardiac evaluation  Past Medical History  Diagnosis Date  . Rheumatoid arthritis ([place])   . Stroke ([place])   . Peripheral neuropathy ([place])   . Diabetes mellitus ([place])   . HLD (hyperlipidemia)   . Allergic rhinitis   . Aortic stenosis   . Hypothyroidism   . Injury of right rotator cuff   . Osteoarthritis   . Obesity   . Continuous right lower quadrant pain   . BPH with obstruction/lower urinary tract symptoms   . HTN (hypertension)     Past Surgical History  Procedure Laterality Date  . Cholecystectomy    . Surgery for sleep apnea    . Lipoma excisions    . Tonsillectomy      Family History  Problem Relation Age of Onset  . Prostate cancer Neg Hx   . Bladder Cancer Neg Hx   . Kidney disease Neg Hx     Social History:  reports that he has quit smoking. He does not have any smokeless tobacco history on file. He reports that he does not drink alcohol or use illicit drugs.  Allergies:  Allergies  Allergen Reactions  . Tylenol [Acetaminophen] Other (See Comments)    Reaction:  Unknown     Medications: I have reviewed the patient's current medications.  Results for orders placed or performed during the hospital encounter of 09/17/15 (from the past 48 hour(s))  CBC     Status: Abnormal   Collection Time: 09/17/15  2:30 AM  Result Value Ref Range   WBC 9.7 3.8 - 10.6 K/uL   RBC  4.38 (L) 4.40 - 5.90 MIL/uL   Hemoglobin 13.6 13.0 - 18.0 g/dL   HCT 39.4 (L) 40.0 - 52.0 %   MCV 89.7 80.0 - 100.0 fL   MCH 31.0 26.0 - 34.0 pg   MCHC 34.6 32.0 - 36.0 g/dL   RDW 13.9 11.5 - 14.5 %   Platelets 131 (L) 150 - 440 K/uL  Troponin I     Status: None   Collection Time: 09/17/15  2:30 AM  Result Value Ref Range   Troponin I <0.03 <0.031 ng/mL    Comment:        NO INDICATION OF MYOCARDIAL INJURY.   Comprehensive metabolic panel     Status: Abnormal   Collection Time: 09/17/15  2:30 AM  Result Value Ref Range   Sodium 136 135 - 145 mmol/L   Potassium 4.0 3.5 - 5.1 mmol/L   Chloride 106 101 - 111 mmol/L   CO2 25 22 - 32 mmol/L   Glucose, Bld 180 (H) 65 - 99 mg/dL   BUN 19 6 - 20 mg/dL   Creatinine, Ser 0.83 0.61 - 1.24 mg/dL   Calcium 8.7 (L) 8.9 - 10.3 mg/dL   Total Protein 6.2 (L) 6.5 - 8.1 g/dL   Albumin 3.8 3.5 - 5.0 g/dL   AST 21 15 - 41 U/L   ALT 17 17 - 63 U/L  Alkaline Phosphatase 94 38 - 126 U/L   Total Bilirubin 0.8 0.3 - 1.2 mg/dL   GFR calc non Af Amer >60 >60 mL/min   GFR calc Af Amer >60 >60 mL/min    Comment: (NOTE) The eGFR has been calculated using the CKD EPI equation. This calculation has not been validated in all clinical situations. eGFR's persistently <60 mL/min signify possible Chronic Kidney Disease.    Anion gap 5 5 - 15  Brain natriuretic peptide (order if patient c/o SOB ONLY)     Status: Abnormal   Collection Time: 09/17/15  2:30 AM  Result Value Ref Range   B Natriuretic Peptide 171.0 (H) 0.0 - 100.0 pg/mL  CK     Status: Abnormal   Collection Time: 09/17/15  2:30 AM  Result Value Ref Range   Total CK 26 (L) 49 - 397 U/L  Troponin I     Status: None   Collection Time: 09/17/15  7:07 AM  Result Value Ref Range   Troponin I <0.03 <0.031 ng/mL    Comment:        NO INDICATION OF MYOCARDIAL INJURY.   MRSA PCR Screening     Status: None   Collection Time: 09/17/15  7:30 AM  Result Value Ref Range   MRSA by PCR NEGATIVE  NEGATIVE    Comment:        The GeneXpert MRSA Assay (FDA approved for NASAL specimens only), is one component of a comprehensive MRSA colonization surveillance program. It is not intended to diagnose MRSA infection nor to guide or monitor treatment for MRSA infections.   Urinalysis complete, with microscopic ([facility] only)     Status: Abnormal   Collection Time: 09/17/15 10:43 AM  Result Value Ref Range   Color, Urine YELLOW (A) YELLOW   APPearance CLEAR (A) CLEAR   Glucose, UA NEGATIVE NEGATIVE mg/dL   Bilirubin Urine NEGATIVE NEGATIVE   Ketones, ur NEGATIVE NEGATIVE mg/dL   Specific Gravity, Urine 1.013 1.005 - 1.030   Hgb urine dipstick NEGATIVE NEGATIVE   pH 5.0 5.0 - 8.0   Protein, ur NEGATIVE NEGATIVE mg/dL   Nitrite NEGATIVE NEGATIVE   Leukocytes, UA NEGATIVE NEGATIVE   RBC / HPF 0-5 0 - 5 RBC/hpf   WBC, UA 0-5 0 - 5 WBC/hpf   Bacteria, UA NONE SEEN NONE SEEN   Squamous Epithelial / LPF NONE SEEN NONE SEEN   Mucous PRESENT    Hyaline Casts, UA PRESENT   Troponin I     Status: None   Collection Time: 09/17/15 10:43 AM  Result Value Ref Range   Troponin I <0.03 <0.031 ng/mL    Comment:        NO INDICATION OF MYOCARDIAL INJURY.     Dg Chest Portable 1 View  09/17/2015  CLINICAL DATA:  Acute onset of generalized chest pain and shortness of breath. Initial encounter. EXAM: PORTABLE CHEST 1 VIEW COMPARISON:  Chest radiograph performed 08/24/2015 FINDINGS: The lungs are well-aerated. Mild bibasilar opacities may reflect mild pneumonia. There is no evidence of pleural effusion or pneumothorax. The cardiomediastinal silhouette is borderline normal in size. No acute osseous abnormalities are seen. IMPRESSION: Mild bibasilar opacities may reflect mild pneumonia. Electronically Signed   By: Garald Balding M.D.   On: 09/17/2015 03:04    Review of Systems  Constitutional: Positive for malaise/fatigue.  HENT: Positive for congestion.   Eyes: Positive for blurred vision.   Respiratory: Positive for shortness of breath.   Cardiovascular: Positive for chest pain.  Gastrointestinal: Positive for heartburn.  Genitourinary: Negative.   Musculoskeletal: Positive for myalgias.  Skin: Negative.   Neurological: Positive for weakness and headaches.  Endo/Heme/Allergies: Negative.   Psychiatric/Behavioral: Negative.    Blood pressure 150/60, pulse 75, temperature 97.5 F (36.4 C), temperature source Oral, resp. rate 20, weight 95.437 kg (210 lb 6.4 oz), SpO2 99 %. Physical Exam  Nursing note and vitals reviewed. Constitutional: He is oriented to person, place, and time. He appears well-developed and well-nourished.  HENT:  Head: Normocephalic.  Bilateral peripheral vision  Neck: Normal range of motion. Neck supple.  Cardiovascular: Regular rhythm.   Murmur heard. Respiratory: Effort normal and breath sounds normal.  GI: Soft. Bowel sounds are normal.  Musculoskeletal: Normal range of motion.  Neurological: He is alert and oriented to person, place, and time.  Skin: Skin is warm and dry.  Psychiatric: He has a normal mood and affect.    Assessment/Plan: Chest pain GERD Obesity Blindness Rheumatoid arthritis CVA Diabetes Hyperlipidemia Hypertension . PLAN Agree with admit for rule out for myocardial infarction Continue telemetry Lovenox for anticoagulation Diabetes continue glipizide Glucotrol Hypertension control with metoprolol losartan clonidine Continue lipid control with Lipitor GERD continue Protonix therapy Recommend levothyroxin therapy for thyroid disease Recommend weight loss exercise portion control Consider functional study for chest pain Consider echocardiogram for left ventricular function   Yaneth Fairbairn D. 09/17/2015, 4:48 PM

## 2015-09-18 ENCOUNTER — Encounter: Payer: Self-pay | Admitting: Radiology

## 2015-09-18 ENCOUNTER — Other Ambulatory Visit: Payer: Self-pay | Admitting: *Deleted

## 2015-09-18 ENCOUNTER — Observation Stay: Payer: PPO

## 2015-09-18 DIAGNOSIS — E119 Type 2 diabetes mellitus without complications: Secondary | ICD-10-CM | POA: Diagnosis not present

## 2015-09-18 DIAGNOSIS — I10 Essential (primary) hypertension: Secondary | ICD-10-CM | POA: Diagnosis not present

## 2015-09-18 DIAGNOSIS — J189 Pneumonia, unspecified organism: Secondary | ICD-10-CM | POA: Diagnosis not present

## 2015-09-18 DIAGNOSIS — E782 Mixed hyperlipidemia: Secondary | ICD-10-CM | POA: Diagnosis not present

## 2015-09-18 LAB — CBC
HCT: 36.8 % — ABNORMAL LOW (ref 40.0–52.0)
HEMOGLOBIN: 12.6 g/dL — AB (ref 13.0–18.0)
MCH: 31 pg (ref 26.0–34.0)
MCHC: 34.3 g/dL (ref 32.0–36.0)
MCV: 90.4 fL (ref 80.0–100.0)
PLATELETS: 130 10*3/uL — AB (ref 150–440)
RBC: 4.07 MIL/uL — ABNORMAL LOW (ref 4.40–5.90)
RDW: 14.6 % — AB (ref 11.5–14.5)
WBC: 10.3 10*3/uL (ref 3.8–10.6)

## 2015-09-18 LAB — LIPID PANEL
CHOL/HDL RATIO: 3.5 ratio
Cholesterol: 99 mg/dL (ref 0–200)
HDL: 28 mg/dL — ABNORMAL LOW (ref >40)
LDL CALC: 45 mg/dL (ref 0–99)
TRIGLYCERIDES: 128 mg/dL (ref <150)
VLDL: 26 mg/dL (ref 0–40)

## 2015-09-18 LAB — ECHOCARDIOGRAM COMPLETE: Weight: 3366.4 oz

## 2015-09-18 LAB — NM MYOCAR MULTI W/SPECT W/WALL MOTION / EF: SRS: 0

## 2015-09-18 LAB — BASIC METABOLIC PANEL
Anion gap: 4 — ABNORMAL LOW (ref 5–15)
BUN: 17 mg/dL (ref 6–20)
CHLORIDE: 103 mmol/L (ref 101–111)
CO2: 27 mmol/L (ref 22–32)
CREATININE: 0.93 mg/dL (ref 0.61–1.24)
Calcium: 8.1 mg/dL — ABNORMAL LOW (ref 8.9–10.3)
GFR calc non Af Amer: >60 mL/min (ref >60)
Glucose, Bld: 94 mg/dL (ref 65–99)
Potassium: 3.6 mmol/L (ref 3.5–5.1)
SODIUM: 134 mmol/L — AB (ref 135–145)

## 2015-09-18 MED ORDER — LEVOFLOXACIN 500 MG PO TABS: 500.0000 mg | ORAL_TABLET | Freq: Every day | ORAL | Status: DC

## 2015-09-18 MED ORDER — CLONIDINE HCL 0.2 MG PO TABS: 0.2000 mg | ORAL_TABLET | Freq: Two times a day (BID) | ORAL | Status: DC

## 2015-09-18 MED ORDER — CLONIDINE HCL 0.1 MG PO TABS: 0.2000 mg | ORAL_TABLET | Freq: Two times a day (BID) | ORAL | Status: DC

## 2015-09-18 MED ORDER — TECHNETIUM TC 99M SESTAMIBI - CARDIOLITE
10.0000 | Freq: Once | INTRAVENOUS | Status: AC | PRN
  Administered 2015-09-18: 10:00:00 13.25 via INTRAVENOUS

## 2015-09-18 MED ORDER — PANTOPRAZOLE SODIUM 40 MG PO TBEC: 40.0000 mg | DELAYED_RELEASE_TABLET | Freq: Every day | ORAL | Status: AC

## 2015-09-18 NOTE — Evaluation (Signed)
Physical Therapy Evaluation Patient Details Name: Chad Rollins MRN: [id number] DOB: 1936-05-25 Today's Date: 09/18/2015   History of Present Illness  80 yo presented to ED due to 2 days of chest pain and SOB. He was just released from [facility] 2 days prior due to multiple falls. His chest pain was found to likely be due to musculoskeletal causes with a pleuritic component. PMH includes RA, stroke, DM, peripheral neuropathy, aortic stenosis. Pt is legally blind.  Clinical Impression  Pt demonstrated generalized weakness and difficulty walking. He performed bed mobility with mod I and transfers and ambulation of 100 ft with FWW with supervision. PT recommended pt have someone stay with him atleast 1-2 days to get settled at home increase safety. HHPT recommended for continued functional mobility training to return to PLOF and perform home safety assessment. All skill PT needs can be meet at next level of care when pt returns home. Pt discharged in house.    Follow Up Recommendations Home health PT    Equipment Recommendations  None recommended by PT    Recommendations for Other Services       Precautions / Restrictions Precautions Precautions: Fall Restrictions Weight Bearing Restrictions: No      Mobility  Bed Mobility Overal bed mobility: Modified Independent             General bed mobility comments: uses rail  Transfers Overall transfer level: Needs assistance Equipment used: Rolling walker (2 wheeled) Transfers: Sit to/from [employer] Transfers Sit to Stand: Supervision Stand pivot transfers: Supervision       General transfer comment: needed 2 attempts to stand without assistance  Ambulation/Gait Ambulation/Gait assistance: Supervision Ambulation Distance (Feet): 100 Feet Assistive device: Rolling walker (2 wheeled) Gait Pattern/deviations: Decreased stride length;Trunk flexed     General Gait Details: steady with no LOB  Stairs             Wheelchair Mobility    Modified Rankin (Stroke Patients Only)       Balance Overall balance assessment: Needs assistance;History of Falls Sitting-balance support: No upper extremity supported Sitting balance-Leahy Scale: Good     Standing balance support: No upper extremity supported Standing balance-Leahy Scale: Fair Standing balance comment: steady with no LOB                             Pertinent Vitals/Pain Pain Assessment: No/denies pain    Home Living Family/patient expects to be discharged to:: Private residence Living Arrangements: Alone Available Help at Discharge: Family;Personal care attendant;Other (Comment) (Hired caregiver 3 days/wk, 4 hrs/day; son lives next door) Type of Home: House Home Access: [place]: One level Home Equipment: Environmental consultant - 2 wheels;Cane - single point;Bedside commode;Shower seat      Prior Function Level of Independence: Independent with assistive device(s)         Comments: Mod indep with SPC for ADLs, household and limited community mobility     Hand Dominance        Extremity/Trunk Assessment   Upper Extremity Assessment: Overall WFL for tasks assessed           Lower Extremity Assessment: Generalized weakness (grossly 4/5)         Communication   Communication: No difficulties  Cognition Arousal/Alertness: Awake/alert Behavior During Therapy: WFL for tasks assessed/performed Overall Cognitive Status: Within Functional Limits for tasks assessed  General Comments      Exercises        Assessment/Plan    PT Assessment All further PT needs can be met in the next venue of care  PT Diagnosis Difficulty walking;Generalized weakness   PT Problem List Decreased strength;Decreased activity tolerance;Decreased balance;Decreased knowledge of use of DME;Decreased safety awareness  PT Treatment Interventions     PT Goals (Current goals can be  found in the Care Plan section) Acute Rehab PT Goals Patient Stated Goal: to go home PT Goal Formulation: With patient Time For Goal Achievement: 10/02/15 Potential to Achieve Goals: Good    Frequency     Barriers to discharge        Co-evaluation               End of Session Equipment Utilized During Treatment: Gait belt Activity Tolerance: Patient tolerated treatment well Patient left: in bed;with nursing/sitter in room;with family/visitor present Nurse Communication: Mobility status    Functional Assessment Tool Used: clinical judgement, gait speed Functional Limitation: Mobility: Walking and moving around Mobility: Walking and Moving Around Current Status [phone]): At least 1 percent but less than 20 percent impaired, limited or restricted Mobility: Walking and Moving Around Goal Status [phone]): At least 1 percent but less than 20 percent impaired, limited or restricted Mobility: Walking and Moving Around Discharge Status [phone]): At least 1 percent but less than 20 percent impaired, limited or restricted    Time: 1355-1410 PT Time Calculation (min) (ACUTE ONLY): 15 min   Charges:   PT Evaluation $PT Eval Low Complexity: 1 Procedure     PT G Codes:   PT G-Codes **NOT FOR INPATIENT CLASS** Functional Assessment Tool Used: clinical judgement, gait speed Functional Limitation: Mobility: Walking and moving around Mobility: Walking and Moving Around Current Status (P3241): At least 1 percent but less than 20 percent impaired, limited or restricted Mobility: Walking and Moving Around Goal Status [phone]): At least 1 percent but less than 20 percent impaired, limited or restricted Mobility: Walking and Moving Around Discharge Status [phone]): At least 1 percent but less than 20 percent impaired, limited or restricted    Neoma Laming, PT, DPT  09/18/2015, 2:28 PM [phone]

## 2015-09-18 NOTE — Care Management Obs Status (Signed)
[facility] NOTIFICATION   Patient Details  Name: Jac Booton MRN: [id number] Date of Birth: 03/31/1936   Medicare Observation Status Notification Given:  Yes    Katrina Stack, RN 09/18/2015, 5:47 PM

## 2015-09-18 NOTE — Discharge Summary (Signed)
[facility] NAME: Chad Rollins    MR#:  [id number]  DATE OF BIRTH:  08/02/1935  DATE OF ADMISSION:  09/17/2015 ADMITTING PHYSICIAN: Saundra Shelling, MD  DATE OF DISCHARGE: 09/18/2015  2:28 PM  PRIMARY CARE PHYSICIAN: SPARKS,JEFFREY D, MD    ADMISSION DIAGNOSIS:  Chest pain, unspecified chest pain type [R07.9]  DISCHARGE DIAGNOSIS:  Active Problems:   Chest pain   SECONDARY DIAGNOSIS:   Past Medical History  Diagnosis Date  . Rheumatoid arthritis ([place])   . Stroke ([place])   . Peripheral neuropathy ([place])   . Diabetes mellitus ([place])   . HLD (hyperlipidemia)   . Allergic rhinitis   . Aortic stenosis   . Hypothyroidism   . Injury of right rotator cuff   . Osteoarthritis   . Obesity   . Continuous right lower quadrant pain   . BPH with obstruction/lower urinary tract symptoms   . HTN (hypertension)     HOSPITAL COURSE:   80 year old male with past medical history significant for neuropathy, rheumatoid arthritis, diabetes, CVA, aortic stenosis and arthritis presents to the hospital secondary to chest pain  #1 chest pain-likely musculoskeletal chest pain with slight pleuritic competent to it. -Troponins are negative 3. -Chest x-ray with bibasilar atelectasis versus infiltrate. -Continue Levaquin at discharge for pneumonia. -Echocardiogram is done and pending.  -added muscle relaxant and monitor.  #2 GERD with significant heartburn symptoms-added Protonix.   #3 hypertension-continue Cozaar, clonidine and metoprolol.  #4 diabetes mellitus-on metformin and glipizide.  #5 depression and anxiety-continue home medications.  Being discharged today  DISCHARGE CONDITIONS:   Guarded  CONSULTS OBTAINED:  Treatment Team:  Teodoro Spray, MD Yolonda Kida, MD  DRUG ALLERGIES:   Allergies  Allergen Reactions  . Tylenol [Acetaminophen] Other (See Comments)    Reaction:  Unknown     DISCHARGE MEDICATIONS:    Discharge Medication List as of 09/18/2015  1:55 PM    START taking these medications   Details  levofloxacin (LEVAQUIN) 500 MG tablet Take 1 tablet (500 mg total) by mouth daily., Starting 09/18/2015, Until Discontinued, Normal    pantoprazole (PROTONIX) 40 MG tablet Take 1 tablet (40 mg total) by mouth daily., Starting 09/18/2015, Until Discontinued, Normal      CONTINUE these medications which have CHANGED   Details  cloNIDine (CATAPRES) 0.2 MG tablet Take 1 tablet (0.2 mg total) by mouth 2 (two) times daily., Starting 09/18/2015, Until Discontinued, Normal      CONTINUE these medications which have NOT CHANGED   Details  amitriptyline (ELAVIL) 25 MG tablet Take 25 mg by mouth at bedtime., Until Discontinued, Historical Med    aspirin EC 81 MG tablet Take 81 mg by mouth daily., Until Discontinued, Historical Med    atorvastatin (LIPITOR) 20 MG tablet Take 20 mg by mouth daily. , Until Discontinued, Historical Med    brimonidine (ALPHAGAN) 0.2 % ophthalmic solution Place 1 drop into the left eye 2 (two) times daily., Until Discontinued, Historical Med    busPIRone (BUSPAR) 15 MG tablet Take 15 mg by mouth 3 (three) times daily. , Until Discontinued, Historical Med    cholecalciferol (VITAMIN D) 1000 units tablet Take 2,000 Units by mouth daily., Until Discontinued, Historical Med    clopidogrel (PLAVIX) 75 MG tablet Take 75 mg by mouth at bedtime. , Until Discontinued, Historical Med    dorzolamide-timolol (COSOPT) 22.3-6.8 MG/ML ophthalmic solution Place 1 drop into both eyes 2 (two) times daily., Until Discontinued,  Historical Med    finasteride (PROSCAR) 5 MG tablet Take 1 tablet (5 mg total) by mouth daily., Starting 05/06/2015, Until Discontinued, Fax    glipiZIDE (GLUCOTROL) 5 MG tablet Take 5 mg by mouth 2 (two) times daily before a meal. , Until Discontinued, Historical Med    guaiFENesin-dextromethorphan (ROBITUSSIN DM) 100-10 MG/5ML syrup Take 5 mLs by mouth every 4  (four) hours as needed for cough., Starting 08/26/2015, Until Discontinued, Normal    latanoprost (XALATAN) 0.005 % ophthalmic solution Place 1 drop into both eyes at bedtime. , Until Discontinued, Historical Med    levothyroxine (SYNTHROID, LEVOTHROID) 75 MCG tablet Take 75 mcg by mouth daily before breakfast., Until Discontinued, Historical Med    losartan (COZAAR) 50 MG tablet Take 50 mg by mouth 2 (two) times daily. , Until Discontinued, Historical Med    metFORMIN (GLUCOPHAGE) 1000 MG tablet Take 1,000 mg by mouth 2 (two) times daily with a meal., Until Discontinued, Historical Med    metoprolol tartrate (LOPRESSOR) 25 MG tablet Take 25 mg by mouth 2 (two) times daily. , Until Discontinued, Historical Med    Multiple Vitamins-Minerals (PRESERVISION AREDS 2) CAPS Take 1 capsule by mouth 2 (two) times daily., Until Discontinued, Historical Med    nystatin (MYCOSTATIN) powder Apply 1 g topically 2 (two) times daily as needed (for rash). , Until Discontinued, Historical Med    QUEtiapine (SEROQUEL) 25 MG tablet Take 25-50 mg by mouth at bedtime as needed (for sleep)., Until Discontinued, Historical Med    tamsulosin (FLOMAX) 0.4 MG CAPS capsule Take 0.4 mg by mouth daily after supper. , Starting 07/14/2014, Until Discontinued, Historical Med    vitamin B-12 (CYANOCOBALAMIN) 1000 MCG tablet Take 1,000 mcg by mouth at bedtime., Until Discontinued, Historical Med         DISCHARGE INSTRUCTIONS:   1. PCP f/u in 1-2 weeks  If you experience worsening of your admission symptoms, develop shortness of breath, life threatening emergency, suicidal or homicidal thoughts you must seek medical attention immediately by calling 911 or calling your MD immediately  if symptoms less severe.  You Must read complete instructions/literature along with all the possible adverse reactions/side effects for all the Medicines you take and that have been prescribed to you. Take any new Medicines after you have  completely understood and accept all the possible adverse reactions/side effects.   Please note  You were cared for by a hospitalist during your hospital stay. If you have any questions about your discharge medications or the care you received while you were in the hospital after you are discharged, you can call the unit and asked to speak with the hospitalist on call if the hospitalist that took care of you is not available. Once you are discharged, your primary care physician will handle any further medical issues. Please note that NO REFILLS for any discharge medications will be authorized once you are discharged, as it is imperative that you return to your primary care physician (or establish a relationship with a primary care physician if you do not have one) for your aftercare needs so that they can reassess your need for medications and monitor your lab values.    Today   CHIEF COMPLAINT:   Chief Complaint  Patient presents with  . Chest Pain     VITAL SIGNS:  Blood pressure 113/45, pulse 74, temperature 97.9 F (36.6 C), temperature source Oral, resp. rate 18, weight 95.437 kg (210 lb 6.4 oz), SpO2 100 %.  I/O:  Intake/Output Summary (Last 24 hours) at 09/18/15 1545 Last data filed at 09/18/15 1130  Gross per 24 hour  Intake    480 ml  Output    450 ml  Net     30 ml    PHYSICAL EXAMINATION:   Physical Exam  GENERAL: 80 y.o.-year-old patient lying in the bed with no acute distress.  EYES: Pupils equal, round, reactive to light and accommodation. No scleral icterus. Extraocular muscles intact.  HEENT: Head atraumatic, normocephalic. Oropharynx and nasopharynx clear.  NECK: Supple, no jugular venous distention. No thyroid enlargement, no tenderness.  LUNGS: Normal breath sounds bilaterally, no wheezing, rales,rhonchi or crepitation. No use of accessory muscles of respiration.  CARDIOVASCULAR: S1, S2 normal. No rubs, or gallops. 3/6 systolic murmur  present. ABDOMEN: Soft, nontender, nondistended. Bowel sounds present. No organomegaly or mass.  EXTREMITIES: No pedal edema, cyanosis, or clubbing.  NEUROLOGIC: Cranial nerves II through XII are intact. Muscle strength 5/5 in all extremities. Sensation intact. Gait not checked.  PSYCHIATRIC: The patient is alert and oriented x 3.  SKIN: No obvious rash, lesion, or ulcer.   DATA REVIEW:   CBC  Recent Labs Lab 09/18/15 0444  WBC 10.3  HGB 12.6*  HCT 36.8*  PLT 130*    Chemistries   Recent Labs Lab 09/17/15 0230 09/18/15 0444  NA 136 134*  K 4.0 3.6  CL 106 103  CO2 25 27  GLUCOSE 180* 94  BUN 19 17  CREATININE 0.83 0.93  CALCIUM 8.7* 8.1*  AST 21  --   ALT 17  --   ALKPHOS 94  --   BILITOT 0.8  --     Cardiac Enzymes  Recent Labs Lab 09/17/15 1043  TROPONINI <0.03    Microbiology Results  Results for orders placed or performed during the hospital encounter of 09/17/15  MRSA PCR Screening     Status: None   Collection Time: 09/17/15  7:30 AM  Result Value Ref Range Status   MRSA by PCR NEGATIVE NEGATIVE Final    Comment:        The GeneXpert MRSA Assay (FDA approved for NASAL specimens only), is one component of a comprehensive MRSA colonization surveillance program. It is not intended to diagnose MRSA infection nor to guide or monitor treatment for MRSA infections.     RADIOLOGY:  Dg Chest Portable 1 View  09/17/2015  CLINICAL DATA:  Acute onset of generalized chest pain and shortness of breath. Initial encounter. EXAM: PORTABLE CHEST 1 VIEW COMPARISON:  Chest radiograph performed 08/24/2015 FINDINGS: The lungs are well-aerated. Mild bibasilar opacities may reflect mild pneumonia. There is no evidence of pleural effusion or pneumothorax. The cardiomediastinal silhouette is borderline normal in size. No acute osseous abnormalities are seen. IMPRESSION: Mild bibasilar opacities may reflect mild pneumonia. Electronically Signed   By: Garald Balding  M.D.   On: 09/17/2015 03:04    EKG:   Orders placed or performed during the hospital encounter of 09/17/15  . EKG 12-Lead  . EKG 12-Lead  . EKG 12-Lead  . EKG 12-Lead  . ED EKG  . ED EKG      Management plans discussed with the patient, family and they are in agreement.  CODE STATUS:     Code Status Orders        Start     Ordered   09/17/15 0625  Full code   Continuous     09/17/15 0624    Code Status History    Date Active  Date Inactive Code Status Order ID Comments User Context   08/24/2015  7:55 PM 08/26/2015  5:46 PM Full Code [id number]  Bettey Costa, MD Inpatient      TOTAL TIME TAKING CARE OF THIS PATIENT: 66 minutes   Marx Doig M.D on 09/18/2015 at 3:45 PM  Between 7am to 6pm - Pager - [phone]  After 6pm go to [email or web address] - password [email or web address] [facility]  Office  [phone]  CC: Primary care physician; Idelle Crouch, MD

## 2015-09-18 NOTE — Consult Note (Signed)
   [facility] CM Inpatient Consult   09/18/2015  Chad Rollins 02/10/1936 [id number]  Epic chart review reveals patient eligible for [facility] , with diagnosis of Diabetes for post hospital discharge follow up. Patient was evaluated for community based chronic disease management services with [facility] care Management Program as a benefit of patient's Health Team advantage Medicare. Met with the patient and his caregiver at the bedside to explain [place] [facility] services. Patient endorses his primary care provider to be Dr. Felipa Furnace. Patient states he was previously ordered [facility] services from [place]  For post SNF follow up but the day they were to come out patient readmitted. Patient stated [facility] was reordered for post discharge follow-up. Consent form signed. Patient states his best contact number is [phone], he also gave permission to speak with his daughter Christin Bach @ [phone]. Patient states this daughter is his POA.  Patient will receive post hospital discharge calls and be evaluated for monthly home visits. [facility] Management services does not interfere with or replace any services arranged by the inpatient care management team. RNCM left contact information and [facility] literature with patient and caregiver, patient was in the process of discharging to home today.  Made inpatient RNCM aware that [facility] will be following for care management. For additional questions please contact:   Zykeem Bauserman RN, [facility] Liaison  [phone]) Business Mobile [phone]) Toll free office

## 2015-09-21 ENCOUNTER — Encounter: Payer: Self-pay | Admitting: *Deleted

## 2015-09-21 ENCOUNTER — Other Ambulatory Visit: Payer: Self-pay | Admitting: *Deleted

## 2015-09-21 NOTE — Patient Outreach (Signed)
[facility]) Care Management  09/21/2015  Leonardo Koob [birth year] [id number]   [facility], to follow up with patient resuming services of OT/PT, since discharge from the hospital. I was able to speak with Becky at the agency, that states that services where discontinued as patient was readmitted to the hospital. Jacqlyn Larsen states she will follow up and make sure that patient was under observation during hospital stay, if so services can be resumed without resumption orders.  I have provided Olevia Bowens with my contact information.  Plan Await follow up from home health agency and update patient with information  Joylene Draft, RN, [place] Management [phone]- Mobile [phone]- [place] .

## 2015-09-21 NOTE — Patient Outreach (Addendum)
[facility]) Care Management  09/21/2015  Mourad Lamanna 13-Jul-1935 [id number]   Transition of care initial call RNCM placed call to patient as part of the transition of care program..Spoke with Mr.Barra that states he is doing well this am denies chest pain, states he did not sleep as well last night. Mr. Muneton states that he has taken his morning medication, including his new prescriptions , he is able to recall that he has 2 more days of his levaquin. Mr. Saleh is states he is legally blind, his daughter, or his personal aide fills his medication box weekly, and he is able to get medication from box to take daily. Patient unable to review medication list.  Mr.Zeiders states he has a follow up appointment with PCP on Monday 3/27  and that his Renford Dills his daughter tries to schedule his appointments on her day off on Monday to provide transportation. Mr.Hinojosa states his son lives next door to him and visits daily, patient has a personal care aide on Wednesday, Thursday and Friday.  Mr.Fulco states that he recently got a new meter to check his blood sugar, it is not one that talks, but states he has trouble seeing to stick his finger so his son or daughter checks his blood sugar a few days a week. Explained to patient update from [place], a  therapist will contact patient in 1 to 2 days to resume services as ordered prior to admission.  Plan Patient will remain active with transition of care program, next scheduled call on 3/29. Patient will attend PCP appointment in the next 14 days  RNCM will send MD barrier letter of involvement  [facility] CM Care Plan Problem One        Most Recent Value   Care Plan Problem One  Recent hospital admission, at risk for readmission   Role Documenting the Problem One  Care Management [place] for Problem One  Active   [facility] Long Term Goal (31-90 days)  Patient will not experience a hospital admission in the next 31  days   [facility] Long Term Goal Start Date  09/21/15   Interventions for Problem One Long Term Goal  Introduced patient to transition of care program, provided my contact infomation, reinforced the importance of taking medications as prescribed, notifying MD of new symptoms    [facility] CM Short Term Goal #1 (0-30 days)  Patient will visit PCP in the next 14 days   [facility] CM Short Term Goal #1 Start Date  09/21/15   Interventions for Short Term Goal #1  Educated on the importance of MD follow up for medication review,verified patient will have transportation, and has appointment scheduled.   [facility] CM Short Term Goal #2 (0-30 days)  Patient will report home health visits in the next 7 days   [facility] CM Short Term Goal #2 Start Date  09/21/15   Interventions for Short Term Goal #2  RNCM placed call to [facility] to verify patient on schedule to be seen in home , to resume services.     Joylene Draft, RN, [place] Management [phone]- Mobile [phone]- Toll Free Main Office

## 2015-09-22 DIAGNOSIS — N401 Enlarged prostate with lower urinary tract symptoms: Secondary | ICD-10-CM | POA: Diagnosis not present

## 2015-09-22 DIAGNOSIS — M069 Rheumatoid arthritis, unspecified: Secondary | ICD-10-CM | POA: Diagnosis not present

## 2015-09-22 DIAGNOSIS — M6281 Muscle weakness (generalized): Secondary | ICD-10-CM | POA: Diagnosis not present

## 2015-09-22 DIAGNOSIS — R296 Repeated falls: Secondary | ICD-10-CM | POA: Diagnosis not present

## 2015-09-22 DIAGNOSIS — R270 Ataxia, unspecified: Secondary | ICD-10-CM | POA: Diagnosis not present

## 2015-09-22 DIAGNOSIS — Z9181 History of falling: Secondary | ICD-10-CM | POA: Diagnosis not present

## 2015-09-22 DIAGNOSIS — Z7982 Long term (current) use of aspirin: Secondary | ICD-10-CM | POA: Diagnosis not present

## 2015-09-22 DIAGNOSIS — Z7984 Long term (current) use of oral hypoglycemic drugs: Secondary | ICD-10-CM | POA: Diagnosis not present

## 2015-09-22 DIAGNOSIS — E785 Hyperlipidemia, unspecified: Secondary | ICD-10-CM | POA: Diagnosis not present

## 2015-09-22 DIAGNOSIS — E039 Hypothyroidism, unspecified: Secondary | ICD-10-CM | POA: Diagnosis not present

## 2015-09-22 DIAGNOSIS — E669 Obesity, unspecified: Secondary | ICD-10-CM | POA: Diagnosis not present

## 2015-09-22 DIAGNOSIS — K219 Gastro-esophageal reflux disease without esophagitis: Secondary | ICD-10-CM | POA: Diagnosis not present

## 2015-09-22 DIAGNOSIS — Z8673 Personal history of transient ischemic attack (TIA), and cerebral infarction without residual deficits: Secondary | ICD-10-CM | POA: Diagnosis not present

## 2015-09-22 DIAGNOSIS — F329 Major depressive disorder, single episode, unspecified: Secondary | ICD-10-CM | POA: Diagnosis not present

## 2015-09-22 DIAGNOSIS — M1990 Unspecified osteoarthritis, unspecified site: Secondary | ICD-10-CM | POA: Diagnosis not present

## 2015-09-22 DIAGNOSIS — I10 Essential (primary) hypertension: Secondary | ICD-10-CM | POA: Diagnosis not present

## 2015-09-22 DIAGNOSIS — E1140 Type 2 diabetes mellitus with diabetic neuropathy, unspecified: Secondary | ICD-10-CM | POA: Diagnosis not present

## 2015-09-23 DIAGNOSIS — F329 Major depressive disorder, single episode, unspecified: Secondary | ICD-10-CM | POA: Diagnosis not present

## 2015-09-23 DIAGNOSIS — R296 Repeated falls: Secondary | ICD-10-CM | POA: Diagnosis not present

## 2015-09-23 DIAGNOSIS — K219 Gastro-esophageal reflux disease without esophagitis: Secondary | ICD-10-CM | POA: Diagnosis not present

## 2015-09-23 DIAGNOSIS — N401 Enlarged prostate with lower urinary tract symptoms: Secondary | ICD-10-CM | POA: Diagnosis not present

## 2015-09-23 DIAGNOSIS — M6281 Muscle weakness (generalized): Secondary | ICD-10-CM | POA: Diagnosis not present

## 2015-09-23 DIAGNOSIS — E1140 Type 2 diabetes mellitus with diabetic neuropathy, unspecified: Secondary | ICD-10-CM | POA: Diagnosis not present

## 2015-09-23 DIAGNOSIS — Z9181 History of falling: Secondary | ICD-10-CM | POA: Diagnosis not present

## 2015-09-23 DIAGNOSIS — E785 Hyperlipidemia, unspecified: Secondary | ICD-10-CM | POA: Diagnosis not present

## 2015-09-23 DIAGNOSIS — Z7982 Long term (current) use of aspirin: Secondary | ICD-10-CM | POA: Diagnosis not present

## 2015-09-23 DIAGNOSIS — I10 Essential (primary) hypertension: Secondary | ICD-10-CM | POA: Diagnosis not present

## 2015-09-23 DIAGNOSIS — Z8673 Personal history of transient ischemic attack (TIA), and cerebral infarction without residual deficits: Secondary | ICD-10-CM | POA: Diagnosis not present

## 2015-09-23 DIAGNOSIS — M1990 Unspecified osteoarthritis, unspecified site: Secondary | ICD-10-CM | POA: Diagnosis not present

## 2015-09-23 DIAGNOSIS — R270 Ataxia, unspecified: Secondary | ICD-10-CM | POA: Diagnosis not present

## 2015-09-23 DIAGNOSIS — E669 Obesity, unspecified: Secondary | ICD-10-CM | POA: Diagnosis not present

## 2015-09-23 DIAGNOSIS — E039 Hypothyroidism, unspecified: Secondary | ICD-10-CM | POA: Diagnosis not present

## 2015-09-23 DIAGNOSIS — M069 Rheumatoid arthritis, unspecified: Secondary | ICD-10-CM | POA: Diagnosis not present

## 2015-09-23 DIAGNOSIS — Z7984 Long term (current) use of oral hypoglycemic drugs: Secondary | ICD-10-CM | POA: Diagnosis not present

## 2015-09-24 ENCOUNTER — Other Ambulatory Visit: Payer: Self-pay

## 2015-09-24 DIAGNOSIS — R296 Repeated falls: Secondary | ICD-10-CM | POA: Diagnosis not present

## 2015-09-24 DIAGNOSIS — N400 Enlarged prostate without lower urinary tract symptoms: Secondary | ICD-10-CM

## 2015-09-24 DIAGNOSIS — I10 Essential (primary) hypertension: Secondary | ICD-10-CM | POA: Diagnosis not present

## 2015-09-24 DIAGNOSIS — R270 Ataxia, unspecified: Secondary | ICD-10-CM | POA: Diagnosis not present

## 2015-09-24 DIAGNOSIS — M6281 Muscle weakness (generalized): Secondary | ICD-10-CM | POA: Diagnosis not present

## 2015-09-24 MED ORDER — FINASTERIDE 5 MG PO TABS: 5.0000 mg | ORAL_TABLET | Freq: Every day | ORAL | Status: DC

## 2015-09-25 DIAGNOSIS — E785 Hyperlipidemia, unspecified: Secondary | ICD-10-CM | POA: Diagnosis not present

## 2015-09-25 DIAGNOSIS — M1990 Unspecified osteoarthritis, unspecified site: Secondary | ICD-10-CM | POA: Diagnosis not present

## 2015-09-25 DIAGNOSIS — Z7982 Long term (current) use of aspirin: Secondary | ICD-10-CM | POA: Diagnosis not present

## 2015-09-25 DIAGNOSIS — R270 Ataxia, unspecified: Secondary | ICD-10-CM | POA: Diagnosis not present

## 2015-09-25 DIAGNOSIS — R296 Repeated falls: Secondary | ICD-10-CM | POA: Diagnosis not present

## 2015-09-25 DIAGNOSIS — E1140 Type 2 diabetes mellitus with diabetic neuropathy, unspecified: Secondary | ICD-10-CM | POA: Diagnosis not present

## 2015-09-25 DIAGNOSIS — Z8673 Personal history of transient ischemic attack (TIA), and cerebral infarction without residual deficits: Secondary | ICD-10-CM | POA: Diagnosis not present

## 2015-09-25 DIAGNOSIS — M6281 Muscle weakness (generalized): Secondary | ICD-10-CM | POA: Diagnosis not present

## 2015-09-25 DIAGNOSIS — Z9181 History of falling: Secondary | ICD-10-CM | POA: Diagnosis not present

## 2015-09-25 DIAGNOSIS — K219 Gastro-esophageal reflux disease without esophagitis: Secondary | ICD-10-CM | POA: Diagnosis not present

## 2015-09-25 DIAGNOSIS — E039 Hypothyroidism, unspecified: Secondary | ICD-10-CM | POA: Diagnosis not present

## 2015-09-25 DIAGNOSIS — E669 Obesity, unspecified: Secondary | ICD-10-CM | POA: Diagnosis not present

## 2015-09-25 DIAGNOSIS — F329 Major depressive disorder, single episode, unspecified: Secondary | ICD-10-CM | POA: Diagnosis not present

## 2015-09-25 DIAGNOSIS — I10 Essential (primary) hypertension: Secondary | ICD-10-CM | POA: Diagnosis not present

## 2015-09-25 DIAGNOSIS — M069 Rheumatoid arthritis, unspecified: Secondary | ICD-10-CM | POA: Diagnosis not present

## 2015-09-25 DIAGNOSIS — Z7984 Long term (current) use of oral hypoglycemic drugs: Secondary | ICD-10-CM | POA: Diagnosis not present

## 2015-09-25 DIAGNOSIS — N401 Enlarged prostate with lower urinary tract symptoms: Secondary | ICD-10-CM | POA: Diagnosis not present

## 2015-09-28 DIAGNOSIS — Z8673 Personal history of transient ischemic attack (TIA), and cerebral infarction without residual deficits: Secondary | ICD-10-CM | POA: Diagnosis not present

## 2015-09-28 DIAGNOSIS — E1140 Type 2 diabetes mellitus with diabetic neuropathy, unspecified: Secondary | ICD-10-CM | POA: Diagnosis not present

## 2015-09-28 DIAGNOSIS — N401 Enlarged prostate with lower urinary tract symptoms: Secondary | ICD-10-CM | POA: Diagnosis not present

## 2015-09-28 DIAGNOSIS — Z9181 History of falling: Secondary | ICD-10-CM | POA: Diagnosis not present

## 2015-09-28 DIAGNOSIS — M6281 Muscle weakness (generalized): Secondary | ICD-10-CM | POA: Diagnosis not present

## 2015-09-28 DIAGNOSIS — E139 Other specified diabetes mellitus without complications: Secondary | ICD-10-CM | POA: Diagnosis not present

## 2015-09-28 DIAGNOSIS — K219 Gastro-esophageal reflux disease without esophagitis: Secondary | ICD-10-CM | POA: Diagnosis not present

## 2015-09-28 DIAGNOSIS — F5104 Psychophysiologic insomnia: Secondary | ICD-10-CM | POA: Diagnosis not present

## 2015-09-28 DIAGNOSIS — E669 Obesity, unspecified: Secondary | ICD-10-CM | POA: Diagnosis not present

## 2015-09-28 DIAGNOSIS — M069 Rheumatoid arthritis, unspecified: Secondary | ICD-10-CM | POA: Diagnosis not present

## 2015-09-28 DIAGNOSIS — R270 Ataxia, unspecified: Secondary | ICD-10-CM | POA: Diagnosis not present

## 2015-09-28 DIAGNOSIS — I10 Essential (primary) hypertension: Secondary | ICD-10-CM | POA: Diagnosis not present

## 2015-09-28 DIAGNOSIS — Z7984 Long term (current) use of oral hypoglycemic drugs: Secondary | ICD-10-CM | POA: Diagnosis not present

## 2015-09-28 DIAGNOSIS — M1990 Unspecified osteoarthritis, unspecified site: Secondary | ICD-10-CM | POA: Diagnosis not present

## 2015-09-28 DIAGNOSIS — R296 Repeated falls: Secondary | ICD-10-CM | POA: Diagnosis not present

## 2015-09-28 DIAGNOSIS — E039 Hypothyroidism, unspecified: Secondary | ICD-10-CM | POA: Diagnosis not present

## 2015-09-28 DIAGNOSIS — E785 Hyperlipidemia, unspecified: Secondary | ICD-10-CM | POA: Diagnosis not present

## 2015-09-28 DIAGNOSIS — Z7982 Long term (current) use of aspirin: Secondary | ICD-10-CM | POA: Diagnosis not present

## 2015-09-28 DIAGNOSIS — F329 Major depressive disorder, single episode, unspecified: Secondary | ICD-10-CM | POA: Diagnosis not present

## 2015-09-28 DIAGNOSIS — E782 Mixed hyperlipidemia: Secondary | ICD-10-CM | POA: Diagnosis not present

## 2015-09-29 DIAGNOSIS — K219 Gastro-esophageal reflux disease without esophagitis: Secondary | ICD-10-CM | POA: Diagnosis not present

## 2015-09-29 DIAGNOSIS — E1140 Type 2 diabetes mellitus with diabetic neuropathy, unspecified: Secondary | ICD-10-CM | POA: Diagnosis not present

## 2015-09-29 DIAGNOSIS — Z7982 Long term (current) use of aspirin: Secondary | ICD-10-CM | POA: Diagnosis not present

## 2015-09-29 DIAGNOSIS — E785 Hyperlipidemia, unspecified: Secondary | ICD-10-CM | POA: Diagnosis not present

## 2015-09-29 DIAGNOSIS — Z7984 Long term (current) use of oral hypoglycemic drugs: Secondary | ICD-10-CM | POA: Diagnosis not present

## 2015-09-29 DIAGNOSIS — R296 Repeated falls: Secondary | ICD-10-CM | POA: Diagnosis not present

## 2015-09-29 DIAGNOSIS — F329 Major depressive disorder, single episode, unspecified: Secondary | ICD-10-CM | POA: Diagnosis not present

## 2015-09-29 DIAGNOSIS — I10 Essential (primary) hypertension: Secondary | ICD-10-CM | POA: Diagnosis not present

## 2015-09-29 DIAGNOSIS — M1990 Unspecified osteoarthritis, unspecified site: Secondary | ICD-10-CM | POA: Diagnosis not present

## 2015-09-29 DIAGNOSIS — E669 Obesity, unspecified: Secondary | ICD-10-CM | POA: Diagnosis not present

## 2015-09-29 DIAGNOSIS — R270 Ataxia, unspecified: Secondary | ICD-10-CM | POA: Diagnosis not present

## 2015-09-29 DIAGNOSIS — M069 Rheumatoid arthritis, unspecified: Secondary | ICD-10-CM | POA: Diagnosis not present

## 2015-09-29 DIAGNOSIS — Z8673 Personal history of transient ischemic attack (TIA), and cerebral infarction without residual deficits: Secondary | ICD-10-CM | POA: Diagnosis not present

## 2015-09-29 DIAGNOSIS — N401 Enlarged prostate with lower urinary tract symptoms: Secondary | ICD-10-CM | POA: Diagnosis not present

## 2015-09-29 DIAGNOSIS — Z9181 History of falling: Secondary | ICD-10-CM | POA: Diagnosis not present

## 2015-09-29 DIAGNOSIS — E039 Hypothyroidism, unspecified: Secondary | ICD-10-CM | POA: Diagnosis not present

## 2015-09-29 DIAGNOSIS — M6281 Muscle weakness (generalized): Secondary | ICD-10-CM | POA: Diagnosis not present

## 2015-09-30 ENCOUNTER — Other Ambulatory Visit: Payer: Self-pay | Admitting: *Deleted

## 2015-09-30 DIAGNOSIS — M1990 Unspecified osteoarthritis, unspecified site: Secondary | ICD-10-CM | POA: Diagnosis not present

## 2015-09-30 DIAGNOSIS — E1140 Type 2 diabetes mellitus with diabetic neuropathy, unspecified: Secondary | ICD-10-CM | POA: Diagnosis not present

## 2015-09-30 DIAGNOSIS — E785 Hyperlipidemia, unspecified: Secondary | ICD-10-CM | POA: Diagnosis not present

## 2015-09-30 DIAGNOSIS — R270 Ataxia, unspecified: Secondary | ICD-10-CM | POA: Diagnosis not present

## 2015-09-30 DIAGNOSIS — Z7982 Long term (current) use of aspirin: Secondary | ICD-10-CM | POA: Diagnosis not present

## 2015-09-30 DIAGNOSIS — I10 Essential (primary) hypertension: Secondary | ICD-10-CM | POA: Diagnosis not present

## 2015-09-30 DIAGNOSIS — K219 Gastro-esophageal reflux disease without esophagitis: Secondary | ICD-10-CM | POA: Diagnosis not present

## 2015-09-30 DIAGNOSIS — E669 Obesity, unspecified: Secondary | ICD-10-CM | POA: Diagnosis not present

## 2015-09-30 DIAGNOSIS — Z8673 Personal history of transient ischemic attack (TIA), and cerebral infarction without residual deficits: Secondary | ICD-10-CM | POA: Diagnosis not present

## 2015-09-30 DIAGNOSIS — E039 Hypothyroidism, unspecified: Secondary | ICD-10-CM | POA: Diagnosis not present

## 2015-09-30 DIAGNOSIS — R296 Repeated falls: Secondary | ICD-10-CM | POA: Diagnosis not present

## 2015-09-30 DIAGNOSIS — Z9181 History of falling: Secondary | ICD-10-CM | POA: Diagnosis not present

## 2015-09-30 DIAGNOSIS — M6281 Muscle weakness (generalized): Secondary | ICD-10-CM | POA: Diagnosis not present

## 2015-09-30 DIAGNOSIS — M069 Rheumatoid arthritis, unspecified: Secondary | ICD-10-CM | POA: Diagnosis not present

## 2015-09-30 DIAGNOSIS — Z7984 Long term (current) use of oral hypoglycemic drugs: Secondary | ICD-10-CM | POA: Diagnosis not present

## 2015-09-30 DIAGNOSIS — F329 Major depressive disorder, single episode, unspecified: Secondary | ICD-10-CM | POA: Diagnosis not present

## 2015-09-30 DIAGNOSIS — N401 Enlarged prostate with lower urinary tract symptoms: Secondary | ICD-10-CM | POA: Diagnosis not present

## 2015-09-30 NOTE — Patient Outreach (Signed)
[facility]) Care Management  09/30/2015  Yvonne Stueber 17-Jun-1936 [id number]   Transition of care week #2  RNCM placed call to patient as part of transition of care follow up. Mr.Eblen reports that he is feeling okay on today and hopes to be able to go out to lunch today with the help of his personal aide. Patient discussed his concern regarding his decreased vision that has gradually gotten worse, he has a follow up appointment with his eye doctor in April.  Patient reports that he had an appointment with his PCP on Monday, his daughter accompanied him  Chest Pain Patient denies any further chest pain , discomfort or shortness of breath.  Diabetes Patient reports that his blood sugar is 267 today but he just recently ate a meal. Patient discussed getting used to her new blood sugar meter, and that the occupational therapist that has visited has been working with him and he is able to see numbers a little better but just slower with sticking finger.  Patient is still followed [facility] , physical and occupational therapies and patient reports participating. Denies having a fall and continues to use   When asking patient about his medications Mr.Kunath immediately  placed his aide Butch Penny on the phone to review medication list he reports she helps his daughter fill the pill box at times.  Patient denies any new concerns.  Plan Patient will remain active with transition of care, next call will be on 4/6, and initial home visit scheduled for 4/13.  Joylene Draft, RN, [place] Management [phone]- Mobile [phone]- Toll Free Main Office

## 2015-10-01 DIAGNOSIS — R270 Ataxia, unspecified: Secondary | ICD-10-CM | POA: Diagnosis not present

## 2015-10-01 DIAGNOSIS — Z7984 Long term (current) use of oral hypoglycemic drugs: Secondary | ICD-10-CM | POA: Diagnosis not present

## 2015-10-01 DIAGNOSIS — M1990 Unspecified osteoarthritis, unspecified site: Secondary | ICD-10-CM | POA: Diagnosis not present

## 2015-10-01 DIAGNOSIS — K219 Gastro-esophageal reflux disease without esophagitis: Secondary | ICD-10-CM | POA: Diagnosis not present

## 2015-10-01 DIAGNOSIS — Z9181 History of falling: Secondary | ICD-10-CM | POA: Diagnosis not present

## 2015-10-01 DIAGNOSIS — F329 Major depressive disorder, single episode, unspecified: Secondary | ICD-10-CM | POA: Diagnosis not present

## 2015-10-01 DIAGNOSIS — Z8673 Personal history of transient ischemic attack (TIA), and cerebral infarction without residual deficits: Secondary | ICD-10-CM | POA: Diagnosis not present

## 2015-10-01 DIAGNOSIS — E669 Obesity, unspecified: Secondary | ICD-10-CM | POA: Diagnosis not present

## 2015-10-01 DIAGNOSIS — M6281 Muscle weakness (generalized): Secondary | ICD-10-CM | POA: Diagnosis not present

## 2015-10-01 DIAGNOSIS — R296 Repeated falls: Secondary | ICD-10-CM | POA: Diagnosis not present

## 2015-10-01 DIAGNOSIS — E1140 Type 2 diabetes mellitus with diabetic neuropathy, unspecified: Secondary | ICD-10-CM | POA: Diagnosis not present

## 2015-10-01 DIAGNOSIS — E039 Hypothyroidism, unspecified: Secondary | ICD-10-CM | POA: Diagnosis not present

## 2015-10-01 DIAGNOSIS — Z7982 Long term (current) use of aspirin: Secondary | ICD-10-CM | POA: Diagnosis not present

## 2015-10-01 DIAGNOSIS — N401 Enlarged prostate with lower urinary tract symptoms: Secondary | ICD-10-CM | POA: Diagnosis not present

## 2015-10-01 DIAGNOSIS — M069 Rheumatoid arthritis, unspecified: Secondary | ICD-10-CM | POA: Diagnosis not present

## 2015-10-01 DIAGNOSIS — E785 Hyperlipidemia, unspecified: Secondary | ICD-10-CM | POA: Diagnosis not present

## 2015-10-01 DIAGNOSIS — I10 Essential (primary) hypertension: Secondary | ICD-10-CM | POA: Diagnosis not present

## 2015-10-02 DIAGNOSIS — E1140 Type 2 diabetes mellitus with diabetic neuropathy, unspecified: Secondary | ICD-10-CM | POA: Diagnosis not present

## 2015-10-02 DIAGNOSIS — M069 Rheumatoid arthritis, unspecified: Secondary | ICD-10-CM | POA: Diagnosis not present

## 2015-10-02 DIAGNOSIS — Z8673 Personal history of transient ischemic attack (TIA), and cerebral infarction without residual deficits: Secondary | ICD-10-CM | POA: Diagnosis not present

## 2015-10-02 DIAGNOSIS — Z7984 Long term (current) use of oral hypoglycemic drugs: Secondary | ICD-10-CM | POA: Diagnosis not present

## 2015-10-02 DIAGNOSIS — E039 Hypothyroidism, unspecified: Secondary | ICD-10-CM | POA: Diagnosis not present

## 2015-10-02 DIAGNOSIS — F329 Major depressive disorder, single episode, unspecified: Secondary | ICD-10-CM | POA: Diagnosis not present

## 2015-10-02 DIAGNOSIS — Z7982 Long term (current) use of aspirin: Secondary | ICD-10-CM | POA: Diagnosis not present

## 2015-10-02 DIAGNOSIS — M6281 Muscle weakness (generalized): Secondary | ICD-10-CM | POA: Diagnosis not present

## 2015-10-02 DIAGNOSIS — N401 Enlarged prostate with lower urinary tract symptoms: Secondary | ICD-10-CM | POA: Diagnosis not present

## 2015-10-02 DIAGNOSIS — K219 Gastro-esophageal reflux disease without esophagitis: Secondary | ICD-10-CM | POA: Diagnosis not present

## 2015-10-02 DIAGNOSIS — R270 Ataxia, unspecified: Secondary | ICD-10-CM | POA: Diagnosis not present

## 2015-10-02 DIAGNOSIS — M1990 Unspecified osteoarthritis, unspecified site: Secondary | ICD-10-CM | POA: Diagnosis not present

## 2015-10-02 DIAGNOSIS — Z9181 History of falling: Secondary | ICD-10-CM | POA: Diagnosis not present

## 2015-10-02 DIAGNOSIS — I10 Essential (primary) hypertension: Secondary | ICD-10-CM | POA: Diagnosis not present

## 2015-10-02 DIAGNOSIS — R296 Repeated falls: Secondary | ICD-10-CM | POA: Diagnosis not present

## 2015-10-02 DIAGNOSIS — E669 Obesity, unspecified: Secondary | ICD-10-CM | POA: Diagnosis not present

## 2015-10-02 DIAGNOSIS — E785 Hyperlipidemia, unspecified: Secondary | ICD-10-CM | POA: Diagnosis not present

## 2015-10-05 DIAGNOSIS — Z7984 Long term (current) use of oral hypoglycemic drugs: Secondary | ICD-10-CM | POA: Diagnosis not present

## 2015-10-05 DIAGNOSIS — M1990 Unspecified osteoarthritis, unspecified site: Secondary | ICD-10-CM | POA: Diagnosis not present

## 2015-10-05 DIAGNOSIS — F329 Major depressive disorder, single episode, unspecified: Secondary | ICD-10-CM | POA: Diagnosis not present

## 2015-10-05 DIAGNOSIS — K219 Gastro-esophageal reflux disease without esophagitis: Secondary | ICD-10-CM | POA: Diagnosis not present

## 2015-10-05 DIAGNOSIS — R296 Repeated falls: Secondary | ICD-10-CM | POA: Diagnosis not present

## 2015-10-05 DIAGNOSIS — Z9181 History of falling: Secondary | ICD-10-CM | POA: Diagnosis not present

## 2015-10-05 DIAGNOSIS — E785 Hyperlipidemia, unspecified: Secondary | ICD-10-CM | POA: Diagnosis not present

## 2015-10-05 DIAGNOSIS — Z8673 Personal history of transient ischemic attack (TIA), and cerebral infarction without residual deficits: Secondary | ICD-10-CM | POA: Diagnosis not present

## 2015-10-05 DIAGNOSIS — E039 Hypothyroidism, unspecified: Secondary | ICD-10-CM | POA: Diagnosis not present

## 2015-10-05 DIAGNOSIS — M6281 Muscle weakness (generalized): Secondary | ICD-10-CM | POA: Diagnosis not present

## 2015-10-05 DIAGNOSIS — Z7982 Long term (current) use of aspirin: Secondary | ICD-10-CM | POA: Diagnosis not present

## 2015-10-05 DIAGNOSIS — E1140 Type 2 diabetes mellitus with diabetic neuropathy, unspecified: Secondary | ICD-10-CM | POA: Diagnosis not present

## 2015-10-05 DIAGNOSIS — N401 Enlarged prostate with lower urinary tract symptoms: Secondary | ICD-10-CM | POA: Diagnosis not present

## 2015-10-05 DIAGNOSIS — M069 Rheumatoid arthritis, unspecified: Secondary | ICD-10-CM | POA: Diagnosis not present

## 2015-10-05 DIAGNOSIS — I10 Essential (primary) hypertension: Secondary | ICD-10-CM | POA: Diagnosis not present

## 2015-10-05 DIAGNOSIS — R270 Ataxia, unspecified: Secondary | ICD-10-CM | POA: Diagnosis not present

## 2015-10-05 DIAGNOSIS — E669 Obesity, unspecified: Secondary | ICD-10-CM | POA: Diagnosis not present

## 2015-10-08 ENCOUNTER — Other Ambulatory Visit: Payer: Self-pay | Admitting: *Deleted

## 2015-10-08 NOTE — Patient Outreach (Signed)
[facility]) Care Management  10/08/2015  Styles Hennessee 1935/12/19 [id number]  Transition of care  RNCM placed call to patient as part of the transition of care program, HIPPA verified, Mr.Wigger states that he is doing pretty good on today, he had an appointment at chiropractor then went out to lunch.  Patient reports that he has been doing well with physical therapy and believes that this will be his last week.  Patient reports that he is still having a little difficulty with checking his blood sugar, due to being unable to see to match blood up against strip, reports this daughter helps him in the evening, and patient states he did not check it this am. Mr.Slane reports that he has a eye appointment on 4/18. Patient reports his appetite has been good and denies having any symptoms of low blood sugar, listed symptoms.   Patient reports taking his medications, as his daughter has filled in his pill box.  Plan Initial home visit in 1 week.  Joylene Draft, RN, [place] Management [phone]- Mobile [phone]- Toll Free Main Office

## 2015-10-09 DIAGNOSIS — R296 Repeated falls: Secondary | ICD-10-CM | POA: Diagnosis not present

## 2015-10-09 DIAGNOSIS — R270 Ataxia, unspecified: Secondary | ICD-10-CM | POA: Diagnosis not present

## 2015-10-09 DIAGNOSIS — M069 Rheumatoid arthritis, unspecified: Secondary | ICD-10-CM | POA: Diagnosis not present

## 2015-10-09 DIAGNOSIS — E039 Hypothyroidism, unspecified: Secondary | ICD-10-CM | POA: Diagnosis not present

## 2015-10-09 DIAGNOSIS — Z9181 History of falling: Secondary | ICD-10-CM | POA: Diagnosis not present

## 2015-10-09 DIAGNOSIS — E785 Hyperlipidemia, unspecified: Secondary | ICD-10-CM | POA: Diagnosis not present

## 2015-10-09 DIAGNOSIS — M1990 Unspecified osteoarthritis, unspecified site: Secondary | ICD-10-CM | POA: Diagnosis not present

## 2015-10-09 DIAGNOSIS — Z8673 Personal history of transient ischemic attack (TIA), and cerebral infarction without residual deficits: Secondary | ICD-10-CM | POA: Diagnosis not present

## 2015-10-09 DIAGNOSIS — E669 Obesity, unspecified: Secondary | ICD-10-CM | POA: Diagnosis not present

## 2015-10-09 DIAGNOSIS — Z7982 Long term (current) use of aspirin: Secondary | ICD-10-CM | POA: Diagnosis not present

## 2015-10-09 DIAGNOSIS — F329 Major depressive disorder, single episode, unspecified: Secondary | ICD-10-CM | POA: Diagnosis not present

## 2015-10-09 DIAGNOSIS — Z7984 Long term (current) use of oral hypoglycemic drugs: Secondary | ICD-10-CM | POA: Diagnosis not present

## 2015-10-09 DIAGNOSIS — K219 Gastro-esophageal reflux disease without esophagitis: Secondary | ICD-10-CM | POA: Diagnosis not present

## 2015-10-09 DIAGNOSIS — M6281 Muscle weakness (generalized): Secondary | ICD-10-CM | POA: Diagnosis not present

## 2015-10-09 DIAGNOSIS — I10 Essential (primary) hypertension: Secondary | ICD-10-CM | POA: Diagnosis not present

## 2015-10-09 DIAGNOSIS — N401 Enlarged prostate with lower urinary tract symptoms: Secondary | ICD-10-CM | POA: Diagnosis not present

## 2015-10-09 DIAGNOSIS — E1140 Type 2 diabetes mellitus with diabetic neuropathy, unspecified: Secondary | ICD-10-CM | POA: Diagnosis not present

## 2015-10-12 DIAGNOSIS — M069 Rheumatoid arthritis, unspecified: Secondary | ICD-10-CM | POA: Diagnosis not present

## 2015-10-12 DIAGNOSIS — Z7984 Long term (current) use of oral hypoglycemic drugs: Secondary | ICD-10-CM | POA: Diagnosis not present

## 2015-10-12 DIAGNOSIS — R270 Ataxia, unspecified: Secondary | ICD-10-CM | POA: Diagnosis not present

## 2015-10-12 DIAGNOSIS — M1990 Unspecified osteoarthritis, unspecified site: Secondary | ICD-10-CM | POA: Diagnosis not present

## 2015-10-12 DIAGNOSIS — Z8673 Personal history of transient ischemic attack (TIA), and cerebral infarction without residual deficits: Secondary | ICD-10-CM | POA: Diagnosis not present

## 2015-10-12 DIAGNOSIS — K219 Gastro-esophageal reflux disease without esophagitis: Secondary | ICD-10-CM | POA: Diagnosis not present

## 2015-10-12 DIAGNOSIS — M6281 Muscle weakness (generalized): Secondary | ICD-10-CM | POA: Diagnosis not present

## 2015-10-12 DIAGNOSIS — E039 Hypothyroidism, unspecified: Secondary | ICD-10-CM | POA: Diagnosis not present

## 2015-10-12 DIAGNOSIS — Z9181 History of falling: Secondary | ICD-10-CM | POA: Diagnosis not present

## 2015-10-12 DIAGNOSIS — R296 Repeated falls: Secondary | ICD-10-CM | POA: Diagnosis not present

## 2015-10-12 DIAGNOSIS — F329 Major depressive disorder, single episode, unspecified: Secondary | ICD-10-CM | POA: Diagnosis not present

## 2015-10-12 DIAGNOSIS — E1140 Type 2 diabetes mellitus with diabetic neuropathy, unspecified: Secondary | ICD-10-CM | POA: Diagnosis not present

## 2015-10-12 DIAGNOSIS — E669 Obesity, unspecified: Secondary | ICD-10-CM | POA: Diagnosis not present

## 2015-10-12 DIAGNOSIS — N401 Enlarged prostate with lower urinary tract symptoms: Secondary | ICD-10-CM | POA: Diagnosis not present

## 2015-10-12 DIAGNOSIS — E785 Hyperlipidemia, unspecified: Secondary | ICD-10-CM | POA: Diagnosis not present

## 2015-10-12 DIAGNOSIS — I10 Essential (primary) hypertension: Secondary | ICD-10-CM | POA: Diagnosis not present

## 2015-10-12 DIAGNOSIS — Z7982 Long term (current) use of aspirin: Secondary | ICD-10-CM | POA: Diagnosis not present

## 2015-10-15 ENCOUNTER — Other Ambulatory Visit: Payer: Self-pay | Admitting: *Deleted

## 2015-10-15 ENCOUNTER — Encounter: Payer: Self-pay | Admitting: *Deleted

## 2015-10-15 DIAGNOSIS — K219 Gastro-esophageal reflux disease without esophagitis: Secondary | ICD-10-CM | POA: Diagnosis not present

## 2015-10-15 DIAGNOSIS — E1140 Type 2 diabetes mellitus with diabetic neuropathy, unspecified: Secondary | ICD-10-CM | POA: Diagnosis not present

## 2015-10-15 DIAGNOSIS — E785 Hyperlipidemia, unspecified: Secondary | ICD-10-CM | POA: Diagnosis not present

## 2015-10-15 DIAGNOSIS — I10 Essential (primary) hypertension: Secondary | ICD-10-CM | POA: Diagnosis not present

## 2015-10-15 DIAGNOSIS — R270 Ataxia, unspecified: Secondary | ICD-10-CM | POA: Diagnosis not present

## 2015-10-15 DIAGNOSIS — E669 Obesity, unspecified: Secondary | ICD-10-CM | POA: Diagnosis not present

## 2015-10-15 DIAGNOSIS — M069 Rheumatoid arthritis, unspecified: Secondary | ICD-10-CM | POA: Diagnosis not present

## 2015-10-15 DIAGNOSIS — M1990 Unspecified osteoarthritis, unspecified site: Secondary | ICD-10-CM | POA: Diagnosis not present

## 2015-10-15 DIAGNOSIS — M6281 Muscle weakness (generalized): Secondary | ICD-10-CM | POA: Diagnosis not present

## 2015-10-15 DIAGNOSIS — E039 Hypothyroidism, unspecified: Secondary | ICD-10-CM | POA: Diagnosis not present

## 2015-10-15 DIAGNOSIS — Z9181 History of falling: Secondary | ICD-10-CM | POA: Diagnosis not present

## 2015-10-15 DIAGNOSIS — Z8673 Personal history of transient ischemic attack (TIA), and cerebral infarction without residual deficits: Secondary | ICD-10-CM | POA: Diagnosis not present

## 2015-10-15 DIAGNOSIS — F329 Major depressive disorder, single episode, unspecified: Secondary | ICD-10-CM | POA: Diagnosis not present

## 2015-10-15 DIAGNOSIS — N401 Enlarged prostate with lower urinary tract symptoms: Secondary | ICD-10-CM | POA: Diagnosis not present

## 2015-10-15 DIAGNOSIS — Z7982 Long term (current) use of aspirin: Secondary | ICD-10-CM | POA: Diagnosis not present

## 2015-10-15 DIAGNOSIS — R296 Repeated falls: Secondary | ICD-10-CM | POA: Diagnosis not present

## 2015-10-15 DIAGNOSIS — Z7984 Long term (current) use of oral hypoglycemic drugs: Secondary | ICD-10-CM | POA: Diagnosis not present

## 2015-10-15 NOTE — Patient Outreach (Signed)
[facility]) Care Management   10/15/2015  Chad Rollins [birth year] [id number]  Chad Rollins is an 80 y.o. male  Subjective: Patient reports doing well on today, denies chest pain.  Objective:  BP 128/70 mmHg  Pulse 80  Resp 18  SpO2 98% Review of Systems  Constitutional: Negative.   HENT: Negative.   Eyes:       Decreased vision  Respiratory: Negative.   Cardiovascular: Negative.  Negative for chest pain and leg swelling.  Gastrointestinal: Negative.   Genitourinary: Negative.   Musculoskeletal: Negative for falls.  Skin: Negative.   Neurological: Negative.   Endo/Heme/Allergies: Negative.   Psychiatric/Behavioral: Negative.     Physical Exam  Constitutional: He is oriented to person, place, and time. He appears well-developed and well-nourished.  Cardiovascular: Normal rate, normal heart sounds and intact distal pulses.   Respiratory: Effort normal and breath sounds normal.  GI: Soft. Bowel sounds are normal.  Neurological: He is alert and oriented to person, place, and time.  Skin: Skin is warm and dry.  Psychiatric: He has a normal mood and affect. His behavior is normal. Judgment and thought content normal.    Encounter Medications:   Outpatient Encounter Prescriptions as of 10/15/2015  Medication Sig  . amitriptyline (ELAVIL) 25 MG tablet Take 25 mg by mouth at bedtime.  Marland Kitchen aspirin EC 81 MG tablet Take 81 mg by mouth daily.  Marland Kitchen atorvastatin (LIPITOR) 20 MG tablet Take 20 mg by mouth daily.   . brimonidine (ALPHAGAN) 0.2 % ophthalmic solution Place 1 drop into the left eye 2 (two) times daily.  . busPIRone (BUSPAR) 15 MG tablet Take 15 mg by mouth 3 (three) times daily.   . cholecalciferol (VITAMIN D) 1000 units tablet Take 2,000 Units by mouth daily.  . cloNIDine (CATAPRES) 0.2 MG tablet Take 1 tablet (0.2 mg total) by mouth 2 (two) times daily.  . clopidogrel (PLAVIX) 75 MG tablet Take 75 mg by mouth at bedtime.   .  dorzolamide-timolol (COSOPT) 22.3-6.8 MG/ML ophthalmic solution Place 1 drop into both eyes 2 (two) times daily.  . finasteride (PROSCAR) 5 MG tablet Take 1 tablet (5 mg total) by mouth daily.  Marland Kitchen glipiZIDE (GLUCOTROL) 5 MG tablet Take 5 mg by mouth 2 (two) times daily before a meal.   . latanoprost (XALATAN) 0.005 % ophthalmic solution Place 1 drop into both eyes at bedtime.   Marland Kitchen levothyroxine (SYNTHROID, LEVOTHROID) 75 MCG tablet Take 75 mcg by mouth daily before breakfast.  . losartan (COZAAR) 50 MG tablet Take 50 mg by mouth 2 (two) times daily.   . Melatonin 5 MG TABS Take 1 tablet by mouth.  . metFORMIN (GLUCOPHAGE) 1000 MG tablet Take 1,000 mg by mouth 2 (two) times daily with a meal.  . metoprolol tartrate (LOPRESSOR) 25 MG tablet Take 25 mg by mouth 2 (two) times daily.   . Multiple Vitamins-Minerals (PRESERVISION AREDS 2) CAPS Take 1 capsule by mouth 2 (two) times daily.  Marland Kitchen nystatin (MYCOSTATIN) powder Apply 1 g topically 2 (two) times daily as needed (for rash).   . pantoprazole (PROTONIX) 40 MG tablet Take 1 tablet (40 mg total) by mouth daily.  . QUEtiapine (SEROQUEL) 25 MG tablet Take 25-50 mg by mouth at bedtime as needed (for sleep).  . tamsulosin (FLOMAX) 0.4 MG CAPS capsule Take 0.4 mg by mouth daily after supper.   . vitamin B-12 (CYANOCOBALAMIN) 1000 MCG tablet Take 1,000 mcg by mouth at bedtime.  Marland Kitchen guaiFENesin-dextromethorphan (ROBITUSSIN DM) 100-10  MG/5ML syrup Take 5 mLs by mouth every 4 (four) hours as needed for cough. (Patient not taking: Reported on 09/30/2015)  . levofloxacin (LEVAQUIN) 500 MG tablet Take 1 tablet (500 mg total) by mouth daily. (Patient not taking: Reported on 09/30/2015)   No facility-administered encounter medications on file as of 10/15/2015.    Functional Status:   In your present state of health, do you have any difficulty performing the following activities: 10/08/2015 09/17/2015  Hearing? [name]  Vision? Y Y  Difficulty concentrating or making  decisions? N N  Walking or climbing stairs? Y Y  Dressing or bathing? N N  Doing errands, shopping? [name]  Preparing Food and eating ? Y -  Using the Toilet? N -  In the past six months, have you accidently leaked urine? Y -  Do you have problems with loss of bowel control? N -  Managing your Medications? Y -  Managing your Finances? N -  Housekeeping or managing your Housekeeping? Y -   Fall Risk  10/15/2015 09/21/2015  Falls in the past year? Yes Yes  Number falls in past yr: 1 2 or more  Injury with Fall? No No  Risk Factor Category  - High Fall Risk  Risk for fall due to : Impaired vision;Impaired balance/gait;History of fall(s) Impaired balance/gait;Impaired vision;History of fall(s)  Follow up Falls prevention discussed Education provided;Falls prevention discussed   Fall/Depression Screening:    PHQ 2/9 Scores 10/08/2015  PHQ - 2 Score 1    Assessment:  Initial home visit as part of transition of care. Patient resting in his recliner his personal care assistant , Chad Rollins present and is patient on Wednesday, Thursday and Friday partial days.  Chest Pain Patient denies chest pain discomfort.  Diabetes Due to patient's limited vision he has difficulty lining blood drop to  strip, so his daughter and son alternate days checking and recording his blood sugars. No episodes of blood sugar 70 or below, 30 day average is 206. Patient is able to take his medication daily as family assist with filling his pill box. Educated on rule of 15 for treating hypoglycemia, provided patient Carbohydrate handout and reviewed, his able to read information with his glasses on discussed eating healthier foods about the same time of day. Noted Hgb A1c   Fall Risk [name] has completed his physical therapy sessions and reports feeling stronger, patient reports ambulating to the edge of his driveway today, and ambulated in home during visit to show me his daily walking pattern. [name] also has physical  therapy exercises posted in his kitchen and does daily. He wears his life alert necklace at all times.  Hypertension Patient states he does not add salt to food and tries to watch what he eat, educated on high salt foods to limit, he had pizza for lunch today, but states that is not a routine. Mr.Palazzi does not monitor blood pressure on a regular basis, he does have an automatic blood pressure monitor and will begin to monitor and record with assistance.    Week 4 of transition of care patient progressed well, discussed continued follow up with Health coach for disease management     Plan:  Will follow up with patient in 2 weeks by phone and transition to health coach for disease management of Diabetes.    [facility] CM Care Plan Problem One        Most Recent Value   Care Plan Problem One  Recent hospital admission, at risk  for readmission   Role Documenting the Problem One  Care Management [place] for Problem One  Active   [facility]N Long Term Goal (31-90 days)  Patient will not experience a hospital admission in the next 31 days   THN Long Term Goal Start Date  09/21/15   Interventions for Problem One Long Term Goal  Discussed   the importance of taking medications as prescribed, notifying MD of new symptoms    [facility]N CM Short Term Goal #1 (0-30 days)  Patient will visit PCP in the next 14 days   THN CM Short Term Goal #1 Start Date  09/21/15   [facility] CM Short Term Goal #1 Met Date  09/30/15   THN CM Short Term Goal #2 (0-30 days)  Patient will report home health visits in the next 7 days   THN CM Short Term Goal #2 Start Date  09/21/15   [facility] CM Short Term Goal #2 Met Date  09/30/15   [facility]N CM Short Term Goal #3 (0-30 days)  Patient will report improved abliity to test his blood sugar on a daily basis in the next 30 days.   THN CM Short Term Goal #3 Start Date  09/30/15   [facility] CM Short Term Goal #3 Met Date  10/15/15   Interventions for Short Tern Goal #3  Family monitors blood sugar  daily due to patient vision problems after trying to check blood sugar, different approaches , prefer to continue this routine   THN CM Short Term Goal #4 (0-30 days)  Patient will begin to monitor and record blood pressure at least 3 days a week in the next 30 days   THN CM Short Term Goal #4 Start Date  10/15/15   Interventions for Short Term Goal #4  Reviewed with patient how to use his machine, provided teachback, allowed to demonstrate, required some assistance with wrapping cuff on arm, will check bp on days his aide is present, provided and instructed how to document in thn calendar book.      Joylene Draft, RN, [place] Management [phone]- Mobile [phone]- Toll Free Main Office

## 2015-10-20 DIAGNOSIS — H401133 Primary open-angle glaucoma, bilateral, severe stage: Secondary | ICD-10-CM | POA: Diagnosis not present

## 2015-10-30 ENCOUNTER — Ambulatory Visit: Payer: Self-pay | Admitting: *Deleted

## 2015-11-02 ENCOUNTER — Other Ambulatory Visit: Payer: Self-pay | Admitting: *Deleted

## 2015-11-02 ENCOUNTER — Encounter: Payer: Self-pay | Admitting: *Deleted

## 2015-11-02 DIAGNOSIS — E118 Type 2 diabetes mellitus with unspecified complications: Secondary | ICD-10-CM

## 2015-11-02 DIAGNOSIS — H3530 Unspecified macular degeneration: Secondary | ICD-10-CM

## 2015-11-02 DIAGNOSIS — H409 Unspecified glaucoma: Secondary | ICD-10-CM

## 2015-11-02 NOTE — Patient Outreach (Signed)
[facility]) Care Management  11/02/2015  Chad Rollins 09/08/1935 [id number]   Telephone assessment post transition of care.  RNCM placed call to patient , Chad Rollins reports that he is managing well at home, denies chest pain or discomfort. Per patient his son and daughter assist him with checking his blood sugar with his new meter  each evening due to patient difficulty in managing checking his blood sugar especially seeing blood  drop enough to place on the strip, due to his limited vision .Discussed with patient about a talking meter and he as declined since just recently got a new meter.  Patient reports taking medication as prepared in his pill organizer by his daughter.   Chad Rollins reports that home health physical therapy has completed services with him, and he continues to do the daily exercises plan that they assigned him, no further falls, he continues to wear his lifeline button at all times.    Chad Rollins managing at home with  good support from family, has a Optician, dispensing 3 days a week.  Plan No new care management needs identified at this time, will plan transition to health coach as previously discussed with patient as he will benefit from  further education and management of his Diabetes patient in agreement.    Joylene Draft, RN, [place] Management [phone]- Mobile [phone]- Toll Free Main Office

## 2015-11-09 DIAGNOSIS — F5104 Psychophysiologic insomnia: Secondary | ICD-10-CM | POA: Diagnosis not present

## 2015-11-09 DIAGNOSIS — I10 Essential (primary) hypertension: Secondary | ICD-10-CM | POA: Diagnosis not present

## 2015-11-09 DIAGNOSIS — I739 Peripheral vascular disease, unspecified: Secondary | ICD-10-CM | POA: Diagnosis not present

## 2015-11-09 DIAGNOSIS — E039 Hypothyroidism, unspecified: Secondary | ICD-10-CM | POA: Diagnosis not present

## 2015-11-09 DIAGNOSIS — E782 Mixed hyperlipidemia: Secondary | ICD-10-CM | POA: Diagnosis not present

## 2015-11-09 DIAGNOSIS — E139 Other specified diabetes mellitus without complications: Secondary | ICD-10-CM | POA: Diagnosis not present

## 2015-11-09 DIAGNOSIS — Z79899 Other long term (current) drug therapy: Secondary | ICD-10-CM | POA: Diagnosis not present

## 2015-12-01 ENCOUNTER — Other Ambulatory Visit: Payer: Self-pay | Admitting: *Deleted

## 2015-12-01 ENCOUNTER — Ambulatory Visit: Payer: Self-pay | Admitting: *Deleted

## 2015-12-03 DIAGNOSIS — R8290 Unspecified abnormal findings in urine: Secondary | ICD-10-CM | POA: Diagnosis not present

## 2015-12-04 ENCOUNTER — Other Ambulatory Visit: Payer: Self-pay | Admitting: Internal Medicine

## 2015-12-04 ENCOUNTER — Ambulatory Visit
Admission: RE | Admit: 2015-12-04 | Discharge: 2015-12-04 | Disposition: A | Discharge status: Home / Self Care | Payer: PPO | Source: Ambulatory Visit | Attending: Internal Medicine | Admitting: Internal Medicine

## 2015-12-04 DIAGNOSIS — N1339 Other hydronephrosis: Secondary | ICD-10-CM | POA: Diagnosis not present

## 2015-12-04 DIAGNOSIS — R600 Localized edema: Secondary | ICD-10-CM | POA: Insufficient documentation

## 2015-12-04 DIAGNOSIS — K5900 Constipation, unspecified: Secondary | ICD-10-CM | POA: Diagnosis not present

## 2015-12-04 DIAGNOSIS — R112 Nausea with vomiting, unspecified: Secondary | ICD-10-CM | POA: Diagnosis not present

## 2015-12-04 DIAGNOSIS — R1031 Right lower quadrant pain: Secondary | ICD-10-CM | POA: Diagnosis not present

## 2015-12-04 DIAGNOSIS — N1330 Unspecified hydronephrosis: Secondary | ICD-10-CM | POA: Diagnosis not present

## 2015-12-04 DIAGNOSIS — Z79899 Other long term (current) drug therapy: Secondary | ICD-10-CM | POA: Diagnosis not present

## 2015-12-04 MED ORDER — IOPAMIDOL (ISOVUE-300) INJECTION 61%
85.0000 mL | Freq: Once | INTRAVENOUS | Status: AC | PRN
  Administered 2015-12-04: 85 mL via INTRAVENOUS

## 2015-12-07 ENCOUNTER — Encounter: Payer: Self-pay | Admitting: Urology

## 2015-12-07 ENCOUNTER — Ambulatory Visit (INDEPENDENT_AMBULATORY_CARE_PROVIDER_SITE_OTHER): Payer: PPO | Admitting: Urology

## 2015-12-07 VITALS — BP 178/75 | HR 70 | Ht 70.0 in | Wt 212.0 lb

## 2015-12-07 DIAGNOSIS — R3129 Other microscopic hematuria: Secondary | ICD-10-CM | POA: Diagnosis not present

## 2015-12-07 DIAGNOSIS — N401 Enlarged prostate with lower urinary tract symptoms: Secondary | ICD-10-CM | POA: Diagnosis not present

## 2015-12-07 DIAGNOSIS — N1330 Unspecified hydronephrosis: Secondary | ICD-10-CM

## 2015-12-07 DIAGNOSIS — N138 Other obstructive and reflux uropathy: Secondary | ICD-10-CM

## 2015-12-07 DIAGNOSIS — R109 Unspecified abdominal pain: Secondary | ICD-10-CM

## 2015-12-07 NOTE — Progress Notes (Signed)
12/07/2015 8:57 PM   Ojani Patsy [place] Jan 03, 1936 [id number]  Referring provider: Idelle Crouch, MD [facility] [place], [place] [address]  Chief Complaint  Patient presents with  . Nephrolithiasis    HPI: 80 year old male seen in follow-up today for possible right ureteral calculus. He was seen and evaluated by his primary care physician (Dr. Doy Hutching) on 12/04/2014 with several days of right lower quadrant pain with associated nausea and vomiting and constipation. No associated fevers or chills. As part of workup for this, he underwent CT abdomen pelvis with contrast which showed mild right hydroureteronephrosis with perinephric fluid and retroperitoneal edema. There was a suggestion of a punctate calcification at the right UVJ but no obvious ureteral calculus.  Most recent creatinine elevated to 1.9 on 12/04/2015 up from 1.11 month ago.  He is T bili was also elevated to 1.4.  He was also noted to have a fairly significant leukocytosis of 17.  At the time negative for infection, only significant for greater than 50 red blood cells per high-power field.  Urine culture on 12/03/2015 was negative.    He continues to have some very mild right lower quadrant pain but this seems to be improving. No fevers or chills. No urinary complaints today.   He denies a personal history of kidney stones.  Previously followed in our office for LUTS on Flomax.  Stable symptoms with no urinary complaints other than buring over the last week.  No gross hematuria.     PMH: Past Medical History  Diagnosis Date  . Rheumatoid arthritis ([place])   . Stroke ([place])   . Peripheral neuropathy ([place])   . Diabetes mellitus ([place])   . HLD (hyperlipidemia)   . Allergic rhinitis   . Aortic stenosis   . Hypothyroidism   . Injury of right rotator cuff   . Osteoarthritis   . Obesity   . Continuous right lower quadrant pain   . BPH with obstruction/lower urinary tract symptoms   . HTN  (hypertension)   . Collagen vascular disease ([place])     Rheumatoid Arthritis    Surgical History: Past Surgical History  Procedure Laterality Date  . Cholecystectomy    . Surgery for sleep apnea    . Lipoma excisions    . Tonsillectomy      Home Medications:    Medication List       This list is accurate as of: 12/07/15  8:57 PM.  Always use your most recent med list.               amitriptyline 25 MG tablet  Commonly known as:  ELAVIL  Take 25 mg by mouth at bedtime.     aspirin EC 81 MG tablet  Take 81 mg by mouth daily.     atorvastatin 20 MG tablet  Commonly known as:  LIPITOR  Take 20 mg by mouth daily.     brimonidine 0.2 % ophthalmic solution  Commonly known as:  ALPHAGAN  Place 1 drop into the left eye 2 (two) times daily.     busPIRone 15 MG tablet  Commonly known as:  BUSPAR  Take 15 mg by mouth 3 (three) times daily.     cholecalciferol 1000 units tablet  Commonly known as:  VITAMIN D  Take 2,000 Units by mouth daily.     cloNIDine 0.2 MG tablet  Commonly known as:  CATAPRES  Take 1 tablet (0.2 mg total) by mouth 2 (two) times daily.  clopidogrel 75 MG tablet  Commonly known as:  PLAVIX  Take 75 mg by mouth at bedtime.     dorzolamide-timolol 22.3-6.8 MG/ML ophthalmic solution  Commonly known as:  COSOPT  Place 1 drop into both eyes 2 (two) times daily.     finasteride 5 MG tablet  Commonly known as:  PROSCAR  Take 1 tablet (5 mg total) by mouth daily.     glipiZIDE 5 MG tablet  Commonly known as:  GLUCOTROL  Take 5 mg by mouth 2 (two) times daily before a meal.     guaiFENesin-dextromethorphan 100-10 MG/5ML syrup  Commonly known as:  ROBITUSSIN DM  Take 5 mLs by mouth every 4 (four) hours as needed for cough.     latanoprost 0.005 % ophthalmic solution  Commonly known as:  XALATAN  Place 1 drop into both eyes at bedtime.     levothyroxine 75 MCG tablet  Commonly known as:  SYNTHROID, LEVOTHROID  Take 75 mcg by mouth daily  before breakfast.     losartan 50 MG tablet  Commonly known as:  COZAAR  Take 50 mg by mouth 2 (two) times daily.     Melatonin 5 MG Tabs  Take 1 tablet by mouth.     metFORMIN 1000 MG tablet  Commonly known as:  GLUCOPHAGE  Take 1,000 mg by mouth 2 (two) times daily with a meal.     metoprolol tartrate 25 MG tablet  Commonly known as:  LOPRESSOR  Take 25 mg by mouth 2 (two) times daily.     nystatin powder  Commonly known as:  MYCOSTATIN  Apply 1 g topically 2 (two) times daily as needed (for rash).     pantoprazole 40 MG tablet  Commonly known as:  PROTONIX  Take 1 tablet (40 mg total) by mouth daily.     PRESERVISION AREDS 2 Caps  Take 1 capsule by mouth 2 (two) times daily.     QUEtiapine 25 MG tablet  Commonly known as:  SEROQUEL  Take 25-50 mg by mouth at bedtime as needed (for sleep).     tamsulosin 0.4 MG Caps capsule  Commonly known as:  FLOMAX  Take 0.4 mg by mouth daily after supper.     vitamin B-12 1000 MCG tablet  Commonly known as:  CYANOCOBALAMIN  Take 1,000 mcg by mouth at bedtime.        Allergies:  Allergies  Allergen Reactions  . Tylenol [Acetaminophen] Other (See Comments)    Reaction:  Unknown     Family History: Family History  Problem Relation Age of Onset  . Prostate cancer Neg Hx   . Bladder Cancer Neg Hx   . Kidney disease Neg Hx   . Diabetes Mother   . Hypertension Mother     Social History:  reports that he quit smoking about 42 years ago. He does not have any smokeless tobacco history on file. He reports that he does not drink alcohol or use illicit drugs.  ROS: UROLOGY Frequent Urination?: No Hard to postpone urination?: No Burning/pain with urination?: No Get up at night to urinate?: No Leakage of urine?: No Urine stream starts and stops?: No Trouble starting stream?: No Do you have to strain to urinate?: No Blood in urine?: No Urinary tract infection?: No Sexually transmitted disease?: No Injury to kidneys or  bladder?: No Painful intercourse?: No Weak stream?: No Erection problems?: No Penile pain?: No  Gastrointestinal Nausea?: No Vomiting?: No Indigestion/heartburn?: No Diarrhea?: No Constipation?: No  Constitutional  Fever: No Night sweats?: No Weight loss?: No Fatigue?: No  Skin Skin rash/lesions?: No Itching?: No  Eyes Blurred vision?: Yes Double vision?: No  Ears/Nose/Throat Sore throat?: No Sinus problems?: No  Hematologic/Lymphatic Swollen glands?: No Easy bruising?: Yes  Cardiovascular Leg swelling?: No Chest pain?: No  Respiratory Cough?: No Shortness of breath?: No  Endocrine Excessive thirst?: No  Musculoskeletal Back pain?: No Joint pain?: No  Neurological Headaches?: No Dizziness?: No  Psychologic Depression?: No Anxiety?: No  Physical Exam: BP 178/75 mmHg  Pulse 70  Ht 5\' 10"  (1.778 m)  Wt 212 lb (96.163 kg)  BMI 30.42 kg/m2  Constitutional:  Alert and oriented, No acute distress.  Presents with son today. HEENT: [place] AT, moist mucus membranes.  Trachea midline, no masses. Cardiovascular: No clubbing, cyanosis, or edema. Respiratory: Normal respiratory effort, no increased work of breathing. GI: Abdomen is soft, nontender, nondistended, no abdominal masses.   GU: No CVA tenderness.  Skin: No rashes, bruises or suspicious lesions. Neurologic: Grossly intact, no focal deficits, moving all 4 extremities. Psychiatric: Normal mood and affect.  Laboratory Data: As per HPI  Urinalysis UA today negative for infection, + microscopic hematuria. See epic.    Pertinent Imaging: Study Result     CLINICAL DATA: Nausea and right lower quadrant pain, 2 days duration. Previous cholecystectomy.  EXAM: CT ABDOMEN AND PELVIS WITH CONTRAST  TECHNIQUE: Multidetector CT imaging of the abdomen and pelvis was performed using the standard protocol following bolus administration of intravenous contrast.  CONTRAST: 63mL ISOVUE-300  IOPAMIDOL (ISOVUE-300) INJECTION 61%  COMPARISON: 07/17/2014  FINDINGS: Mild scarring and emphysema at the lung bases. No acute process. No pleural or pericardial fluid.  Mild diffuse fatty change of the liver. 1.5 cm cyst in the posterior segment right lobe is unchanged in the previous study. No other focal liver finding. Previous cholecystectomy. The spleen is at the upper limits of normal in size. No focal lesion. The pancreas is normal. The adrenal glands are normal. The kidneys appear to excrete contrast poorly. Sub cm cyst in the lower pole the right kidney. No hydronephrosis on the left. On the right, there is mild fullness of the collecting system and ureter but there is no large passing stone evident. There may be a sub mm stone at the right UVJ. There is atherosclerosis of the aorta and its branch vessels but no aneurysm. The IVC is normal. No retroperitoneal mass or adenopathy. No free intraperitoneal fluid or air. Benign appearing mesenteric calcification in the right abdomen is unchanged measuring 2.3 cm. This is not of concern. I think the appendix is normal. Some edema adjacent to the tip of the appendix probably relates to pyelo sinus extravasation from the urinary tract pathology. Bladder, prostate gland and seminal vesicles are unremarkable otherwise. Extensive chronic degenerative changes affect the spine.  IMPRESSION: Mild hydroureteronephrosis on the right with pyelo sinus extravasation resulting in perinephric edema and some retroperitoneal edema. Sub mm stone may be present at the right UPJ. No large or obstructing stone presently. Edema adjacent to the tip of the appendix is favored to be secondary to pyelo sinus extravasation rather than appendicitis.   CT scan personally reviewed today with the patient and his son.  Assessment & Plan:   80 year old male with right lower quadrant pain found to have evidence of minimal rate hydronephrosis with  perinephric fluid without a clear obstructing stone on CT scan. This may represent a recently passed stone at the time of the scan especially in the setting  of new microscopic hematuria.  He has since had significant improvement in his pain level and resolution of his nausea and vomiting.  1. Hydronephrosis, right Recommend follow-up renal ultrasound in 2 weeks to assess for complete resolution of hydronephrosis. We'll call him with the results.  In the meantime, he was recommended to return sooner or to the emergency room if he develops recurrent severe pain, fevers greater than 100.4, or any other concerning symptoms. These instructions were written down for the patient and his son.  - US Renal; Future  2. Right flank pain Improving. As above.  3. Microscopic hematuria Likely secondary to recently passed stone.  No present on previous UAs. - Urinalysis, Complete  4. BPH with obstruction/lower urinary tract symptoms Continue Flomax   Return in about 1 year (around 12/06/2016) for IPSS (will call with renal ultrasound results in 2 weeks).  Hollice Espy, MD  [facility] [address], [place], [place] [address] [phone]

## 2015-12-08 LAB — URINALYSIS, COMPLETE
Bilirubin, UA: NEGATIVE
Glucose, UA: NEGATIVE
Ketones, UA: NEGATIVE
Leukocytes, UA: NEGATIVE
Nitrite, UA: NEGATIVE
Specific Gravity, UA: 1.015 (ref 1.005–1.030)
Urobilinogen, Ur: 1 mg/dL (ref 0.2–1.0)
pH, UA: 6 (ref 5.0–7.5)

## 2015-12-08 LAB — MICROSCOPIC EXAMINATION
Bacteria, UA: NONE SEEN
RBC, UA: >30 /hpf — AB (ref 0–?)

## 2015-12-16 ENCOUNTER — Ambulatory Visit
Admission: RE | Admit: 2015-12-16 | Discharge: 2015-12-16 | Disposition: A | Discharge status: Home / Self Care | Payer: PPO | Source: Ambulatory Visit | Attending: Urology | Admitting: Urology

## 2015-12-16 DIAGNOSIS — N281 Cyst of kidney, acquired: Secondary | ICD-10-CM | POA: Insufficient documentation

## 2015-12-16 DIAGNOSIS — N1330 Unspecified hydronephrosis: Secondary | ICD-10-CM

## 2015-12-16 DIAGNOSIS — Z09 Encounter for follow-up examination after completed treatment for conditions other than malignant neoplasm: Secondary | ICD-10-CM | POA: Diagnosis not present

## 2015-12-17 ENCOUNTER — Telehealth: Payer: Self-pay

## 2015-12-17 NOTE — Telephone Encounter (Signed)
Spoke with pt in reference to RUS results. Pt voiced understanding.  

## 2015-12-17 NOTE — Telephone Encounter (Signed)
-----   Message from Hollice Espy, MD sent at 12/16/2015  4:38 PM EDT ----- Please let this patient know that renal ultrasound looks good.  No further kidney swelling.  Great news!  Hollice Espy, MD

## 2015-12-23 DIAGNOSIS — Z79899 Other long term (current) drug therapy: Secondary | ICD-10-CM | POA: Diagnosis not present

## 2015-12-23 DIAGNOSIS — R531 Weakness: Secondary | ICD-10-CM | POA: Diagnosis not present

## 2015-12-23 DIAGNOSIS — N1330 Unspecified hydronephrosis: Secondary | ICD-10-CM | POA: Diagnosis not present

## 2015-12-23 DIAGNOSIS — N179 Acute kidney failure, unspecified: Secondary | ICD-10-CM | POA: Diagnosis not present

## 2015-12-25 ENCOUNTER — Encounter: Payer: Self-pay | Admitting: *Deleted

## 2015-12-25 NOTE — Patient Outreach (Signed)
[facility]) Care Management  [place]  12/01/2015  Chad Rollins 12/05/1935 [id number]  Subjective: RN Health Coach telephone call to patient.  Hipaa compliance verified. Per patient he is legally blind and his daughter reads mostly everything for him. He stated that he can see a little with his glasses if he holds it up close but after 4 pm he can't see anything. Per patient he has macular degeneration and glaucoma.  Per patient he has a caregiver that comes three times a week and takes him out. He live alone. His caregiver cleans his house and fixes meals and sometimes his daughter brings him food. Patient son checks his blood sugar at night. Per patient he now has a call alert button since he fell out of the bed. He had no injuries but he was weak and laid on the floor till the next morning. Patient uses a walker and cane . He ambulates to the mailbox, down the sidewalk and front porch a couple times a day for his exercise.. Per patient his blood sugar was 126 non fasting. Patient stated that he feels better when his blood sugar is high. He stated when it was down to 98 he became weak. Per patient he has a scab on his leg that  Came off and the care giver is monitoring it for him. Patient eats whatever he wants. Per patient he likes donuts, cheerios and raisins. Per patient he doesn't go to a podiatrist. He goes to a nail spa for getting his toenails and fingernails cut. RN Health Coach discussed the risk and patient stated I have been going there for years why should I pay twice that much to a foot specialist. RN explained what the foot specialist is looking for. Patient has agreed to follow up outreach calls.   Objective:   Encounter Medications:  Outpatient Encounter Prescriptions as of 12/01/2015  Medication Sig  . metFORMIN (GLUCOPHAGE) 1000 MG tablet Take 1,000 mg by mouth 2 (two) times daily with a meal.  . amitriptyline (ELAVIL) 25 MG tablet Take 25 mg by  mouth at bedtime.  Marland Kitchen aspirin EC 81 MG tablet Take 81 mg by mouth daily.  Marland Kitchen atorvastatin (LIPITOR) 20 MG tablet Take 20 mg by mouth daily.   . brimonidine (ALPHAGAN) 0.2 % ophthalmic solution Place 1 drop into the left eye 2 (two) times daily.  . busPIRone (BUSPAR) 15 MG tablet Take 15 mg by mouth 3 (three) times daily.   . cholecalciferol (VITAMIN D) 1000 units tablet Take 2,000 Units by mouth daily.  . cloNIDine (CATAPRES) 0.2 MG tablet Take 1 tablet (0.2 mg total) by mouth 2 (two) times daily.  . clopidogrel (PLAVIX) 75 MG tablet Take 75 mg by mouth at bedtime.   . dorzolamide-timolol (COSOPT) 22.3-6.8 MG/ML ophthalmic solution Place 1 drop into both eyes 2 (two) times daily.  . finasteride (PROSCAR) 5 MG tablet Take 1 tablet (5 mg total) by mouth daily.  Marland Kitchen glipiZIDE (GLUCOTROL) 5 MG tablet Take 5 mg by mouth 2 (two) times daily before a meal.   . guaiFENesin-dextromethorphan (ROBITUSSIN DM) 100-10 MG/5ML syrup Take 5 mLs by mouth every 4 (four) hours as needed for cough.  . latanoprost (XALATAN) 0.005 % ophthalmic solution Place 1 drop into both eyes at bedtime.   Marland Kitchen levothyroxine (SYNTHROID, LEVOTHROID) 75 MCG tablet Take 75 mcg by mouth daily before breakfast.  . losartan (COZAAR) 50 MG tablet Take 50 mg by mouth 2 (two) times daily.   Marland Kitchen  Melatonin 5 MG TABS Take 1 tablet by mouth.  . metoprolol tartrate (LOPRESSOR) 25 MG tablet Take 25 mg by mouth 2 (two) times daily.   . Multiple Vitamins-Minerals (PRESERVISION AREDS 2) CAPS Take 1 capsule by mouth 2 (two) times daily.  Marland Kitchen nystatin (MYCOSTATIN) powder Apply 1 g topically 2 (two) times daily as needed (for rash).   . pantoprazole (PROTONIX) 40 MG tablet Take 1 tablet (40 mg total) by mouth daily.  . QUEtiapine (SEROQUEL) 25 MG tablet Take 25-50 mg by mouth at bedtime as needed (for sleep).  . tamsulosin (FLOMAX) 0.4 MG CAPS capsule Take 0.4 mg by mouth daily after supper.   . vitamin B-12 (CYANOCOBALAMIN) 1000 MCG tablet Take 1,000 mcg by  mouth at bedtime.  . [DISCONTINUED] levofloxacin (LEVAQUIN) 500 MG tablet Take 1 tablet (500 mg total) by mouth daily. (Patient not taking: Reported on 09/30/2015)   No facility-administered encounter medications on file as of 12/01/2015.    Functional Status:  In your present state of health, do you have any difficulty performing the following activities: 12/01/2015 10/08/2015  Hearing? [name]  Vision? Y Y  Difficulty concentrating or making decisions? N N  Walking or climbing stairs? Y Y  Dressing or bathing? N N  Doing errands, shopping? [name]  Preparing Food and eating ? Y Y  Using the Toilet? N N  In the past six months, have you accidently leaked urine? Y Y  Do you have problems with loss of bowel control? N N  Managing your Medications? Y Y  Managing your Finances? N N  Housekeeping or managing your Housekeeping? N Y    Fall/Depression Screening: PHQ 2/9 Scores 12/01/2015 10/08/2015  PHQ - 2 Score 0 1   THN CM Care Plan Problem One        Most Recent Value   Care Plan Problem One  Knowledge Deficit in Self Management of Diabetes   Role Documenting the Problem One  [place] for Problem One  Active   THN Long Term Goal (31-90 days)  Patient will not have any readamissions within the next 90 days   THN Long Term Goal Start Date  12/01/15   Interventions for Problem One Long Term Goal  RN discussed with patient the importance of keeping appointments. RN discussed with patient the importance of taking medications as ordered.  RN will monitor patient monithly telephonically   THN CM Short Term Goal #1 (0-30 days)  Patient will be able to verbalize foods that make up a healthy snack within 30 days   THN CM Short Term Goal #1 Start Date  12/01/15   Interventions for Short Term Goal #1  RN discusses and read to patient about foods that make up a healthy sanck. Patient responds using teach back as our tool since he is blind   THN CM Short Term Goal #2 (0-30 days)  Patient will  be able to discuss signs and symptoms of high and low blood sugar within 30 days   THN CM Short Term Goal #2 Start Date  12/01/15   Interventions for Short Term Goal #2  RN read to patient signs and symptoms of high and low blood sugar. RN uses teachback to make sure patient understands   THN CM Short Term Goal #3 (0-30 days)  Patient will be able to verbalize the action plan for high and low blood sugar within 30 days   THN CM Short Term Goal #3 Start Date  12/01/15   Interventions for Short Tern Goal #3  RN discusses  with patient the action plan for hypo and hyperglycemia. RN allows patient to verbalize what he is to do if this occurs.      Assessment:  Patient is legally blind Knowledge deficit in self management of diabetes Patient will benefit from [place] telephonic outreach for education and support for diabetes self management. Patient has agreed to the method of RN calling and reading information and then have a discussion with teach back   Plan: RN will send patient educational material on hypo and hyperglycemia for patient family and care giver RN will call patient and read educational material on hypo and hyperglycemia and allow patient teach back RN will follow up within a month with patient  [place] [facility] [phone]  RN will call patient and discuss foods that make up a healthy snack then have patient teach ad discuss what he has learned.

## 2016-01-01 ENCOUNTER — Other Ambulatory Visit: Payer: Self-pay | Admitting: *Deleted

## 2016-01-01 NOTE — Patient Outreach (Signed)
Chad Rollins) Care Management  [place]  01/01/2016   Chad Rollins 08/15/1935 [id number]  Subjective: RN Health Coach telephone call to patient.  Hipaa compliance verified. Per patient he is doing fair.  Patient stated his blood sugar is 210 today. It was not a fasting blood sugar. Per patient yesterday Blood glucose  was 214 not fasting. Patient had cereal  for breakfast and he stated he ate 2 oatmeal cookies at lunch. Per patient the caregiver checked his blood sugar since his son is out of town and his daughter has other obligations at church this week.  RN Health coach discussed hypo and hyperglycemia symptoms and action plan. Patient interacted very well with discussion. Patient is unable to see usually around 4 pm. Per patient he can only see light. He can not distinguish any features. Patient is unable to check his blood sugar. Per patient he is dependant on others to do this for him.  Per patient he had just passed a kidney stone. Patient stated that his urine  stream is much better. He stated this is the best it has been in over a year.  Patient has agreed to follow up outreach call..    Objective:   Encounter Medications:  Outpatient Encounter Prescriptions as of 01/01/2016  Medication Sig  . amitriptyline (ELAVIL) 25 MG tablet Take 25 mg by mouth at bedtime.  Marland Kitchen aspirin EC 81 MG tablet Take 81 mg by mouth daily.  Marland Kitchen atorvastatin (LIPITOR) 20 MG tablet Take 20 mg by mouth daily.   . brimonidine (ALPHAGAN) 0.2 % ophthalmic solution Place 1 drop into the left eye 2 (two) times daily.  . busPIRone (BUSPAR) 15 MG tablet Take 15 mg by mouth 3 (three) times daily.   . cholecalciferol (VITAMIN D) 1000 units tablet Take 2,000 Units by mouth daily.  . cloNIDine (CATAPRES) 0.2 MG tablet Take 1 tablet (0.2 mg total) by mouth 2 (two) times daily.  . clopidogrel (PLAVIX) 75 MG tablet Take 75 mg by mouth at bedtime.   . dorzolamide-timolol (COSOPT) 22.3-6.8 MG/ML  ophthalmic solution Place 1 drop into both eyes 2 (two) times daily.  . finasteride (PROSCAR) 5 MG tablet Take 1 tablet (5 mg total) by mouth daily.  Marland Kitchen glipiZIDE (GLUCOTROL) 5 MG tablet Take 5 mg by mouth 2 (two) times daily before a meal.   . guaiFENesin-dextromethorphan (ROBITUSSIN DM) 100-10 MG/5ML syrup Take 5 mLs by mouth every 4 (four) hours as needed for cough.  . latanoprost (XALATAN) 0.005 % ophthalmic solution Place 1 drop into both eyes at bedtime.   Marland Kitchen levothyroxine (SYNTHROID, LEVOTHROID) 75 MCG tablet Take 75 mcg by mouth daily before breakfast.  . losartan (COZAAR) 50 MG tablet Take 50 mg by mouth 2 (two) times daily.   . Melatonin 5 MG TABS Take 1 tablet by mouth.  . metFORMIN (GLUCOPHAGE) 1000 MG tablet Take 1,000 mg by mouth 2 (two) times daily with a meal.  . metoprolol tartrate (LOPRESSOR) 25 MG tablet Take 25 mg by mouth 2 (two) times daily.   . Multiple Vitamins-Minerals (PRESERVISION AREDS 2) CAPS Take 1 capsule by mouth 2 (two) times daily.  Marland Kitchen nystatin (MYCOSTATIN) powder Apply 1 g topically 2 (two) times daily as needed (for rash).   . pantoprazole (PROTONIX) 40 MG tablet Take 1 tablet (40 mg total) by mouth daily.  . QUEtiapine (SEROQUEL) 25 MG tablet Take 25-50 mg by mouth at bedtime as needed (for sleep).  . tamsulosin (FLOMAX) 0.4 MG CAPS  capsule Take 0.4 mg by mouth daily after supper.   . vitamin B-12 (CYANOCOBALAMIN) 1000 MCG tablet Take 1,000 mcg by mouth at bedtime.   No facility-administered encounter medications on file as of 01/01/2016.    Functional Status:  In your present state of health, do you have any difficulty performing the following activities: 01/01/2016 12/01/2015  Hearing? [name]  Vision? Y Y  Difficulty concentrating or making decisions? N N  Walking or climbing stairs? Y Y  Dressing or bathing? N N  Doing errands, shopping? [name]  Preparing Food and eating ? Y Y  Using the Toilet? N N  In the past six months, have you accidently leaked urine? Y  Y  Do you have problems with loss of bowel control? N N  Managing your Medications? Y Y  Managing your Finances? N N  Housekeeping or managing your Housekeeping? N N    Fall/Depression Screening: PHQ 2/9 Scores 01/01/2016 12/01/2015 10/08/2015  PHQ - 2 Score 0 0 1   THN CM Care Plan Problem One        Most Recent Value   Care Plan Problem One  Knowledge Deficit in Self Management of Diabetes   Role Documenting the Problem One  [place] for Problem One  Active   THN Long Term Goal (31-90 days)  Patient will not have any readamissions within the next 90 days   THN Long Term Goal Start Date  12/01/15   Interventions for Problem One Long Term Goal  RN discussed with patient the importance of keeping appointments. RN discussed with patient the importance of taking medications as ordered.  RN will monitor patient monithly telephonically   THN CM Short Term Goal #1 (0-30 days)  Patient will be able to verbalize foods that make up a healthy snack within 30 days   THN CM Short Term Goal #1 Start Date  01/01/16   Interventions for Short Term Goal #1  RN discusses and read to patient about foods that make up a healthy sanck. Patient responds using teach back as our tool since he is blind   THN CM Short Term Goal #2 (0-30 days)  Patient will be able to discuss signs and symptoms of high and low blood sugar within 30 days   THN CM Short Term Goal #2 Start Date  12/01/15   Chad Rollins Rollins CM Short Term Goal #2 Met Date  01/01/16   Interventions for Short Term Goal #2  RN read to patient signs and symptoms of high and low blood sugar. RN uses teachback to make sure patient understands   THN CM Short Term Goal #3 (0-30 days)  Patient will be able to verbalize the action plan for high and low blood sugar within 30 days   THN CM Short Term Goal #3 Start Date  12/01/15   [facility] CM Short Term Goal #3 Met Date  01/01/16   Interventions for Short Tern Goal #3  RN discusses  with patient the action plan for hypo  and hyperglycemia. RN allows patient to verbalize what he is to do if this occurs.   THN CM Short Term Goal #4 (0-30 days)  Patient will be able to verbalize What the A1C means and how the blood sugars affect the A1c within the next 30 days.   THN CM Short Term Goal #4 Start Date  01/01/16   Interventions for Short Term Goal #4  RN will read to patient what the A1c means  and How the A1c tests helps. Patient  and RN will have a discussion an teach back.       Assessment:  Patient is interacting very well with the reading,discussion and teach back method Patient will continue to  benefit from Health Coach telephonic outreach for education and support for diabetes self management.  Plan:  RN will read to patient about what the A1C means and how the blood sugars affect the A1C RN will follow up within 30 days and use the read, discuss and teach back method. Patient understands that we will take a topic and review each month. RN will follow up within 30 days

## 2016-01-18 DIAGNOSIS — H401133 Primary open-angle glaucoma, bilateral, severe stage: Secondary | ICD-10-CM | POA: Diagnosis not present

## 2016-01-20 ENCOUNTER — Ambulatory Visit: Payer: Medicare Other | Admitting: Urology

## 2016-01-20 DIAGNOSIS — H401133 Primary open-angle glaucoma, bilateral, severe stage: Secondary | ICD-10-CM | POA: Diagnosis not present

## 2016-01-21 ENCOUNTER — Ambulatory Visit: Payer: Self-pay | Admitting: *Deleted

## 2016-01-25 ENCOUNTER — Ambulatory Visit (INDEPENDENT_AMBULATORY_CARE_PROVIDER_SITE_OTHER): Payer: PPO | Admitting: Urology

## 2016-01-25 ENCOUNTER — Encounter: Payer: Self-pay | Admitting: Urology

## 2016-01-25 VITALS — BP 128/68 | HR 60 | Ht 70.0 in | Wt 207.2 lb

## 2016-01-25 DIAGNOSIS — N401 Enlarged prostate with lower urinary tract symptoms: Secondary | ICD-10-CM

## 2016-01-25 DIAGNOSIS — N138 Other obstructive and reflux uropathy: Secondary | ICD-10-CM

## 2016-01-25 MED ORDER — TAMSULOSIN HCL 0.4 MG PO CAPS: 0.4000 mg | ORAL_CAPSULE | Freq: Every day | ORAL | 12 refills | Status: AC

## 2016-01-25 MED ORDER — FINASTERIDE 5 MG PO TABS: 5.0000 mg | ORAL_TABLET | Freq: Every day | ORAL | 12 refills | Status: DC

## 2016-01-25 NOTE — Progress Notes (Signed)
01/25/2016 10:37 AM   Chad Rollins, Chad Rollins [id number]  Referring provider: Idelle Crouch, MD [place] [facility] [place], [place] [address]  Chief Complaint  Patient presents with  . Benign Prostatic Hypertrophy    1 year    HPI: Chad Rollins is a 80 year old Caucasian male who has a history of BPH with LUTS who presents for a 12 months follow up.  His IPSS score is 5, which is mild lower urinary tract symptomatology. He is mostly satisfied with his quality life due to his urinary symptoms.   He has not had any recent fevers, chills, nausea or vomiting. He also denies any dysuria, gross hematuria or suprapubic pain.        IPSS    Row Name 01/25/16 1000         International Prostate Symptom Score   How often have you had the sensation of not emptying your bladder? Not at All     How often have you had to urinate less than every two hours? Not at All     How often have you found you stopped and started again several times when you urinated? About half the time     How often have you found it difficult to postpone urination? Not at All     How often have you had a weak urinary stream? Not at All     How often have you had to strain to start urination? Not at All     How many times did you typically get up at night to urinate? 2 Times     Total IPSS Score 5       Quality of Life due to urinary symptoms   If you were to spend the rest of your life with your urinary condition just the way it is now how would you feel about that? Mostly Satisfied        Score:  1-7 Mild 8-19 Moderate 20-35 Severe   PMH: Past Medical History:  Diagnosis Date  . Allergic rhinitis   . Aortic stenosis   . BPH with obstruction/lower urinary tract symptoms   . Collagen vascular disease (HCC)    Rheumatoid Arthritis  . Continuous right lower quadrant pain   . Diabetes mellitus ([place])   . HLD (hyperlipidemia)   . HTN (hypertension)   . Hypothyroidism   .  Injury of right rotator cuff   . Obesity   . Osteoarthritis   . Peripheral neuropathy ([place])   . Rheumatoid arthritis ([place])   . Stroke [facility])     Surgical History: Past Surgical History:  Procedure Laterality Date  . CHOLECYSTECTOMY    . Lipoma excisions    . Surgery for sleep apnea    . TONSILLECTOMY      Home Medications:    Medication List       Accurate as of 01/25/16 10:37 AM. Always use your most recent med list.          amitriptyline 25 MG tablet Commonly known as:  ELAVIL Take 25 mg by mouth at bedtime.   aspirin EC 81 MG tablet Take 81 mg by mouth daily.   atorvastatin 20 MG tablet Commonly known as:  LIPITOR Take 20 mg by mouth daily.   brimonidine 0.2 % ophthalmic solution Commonly known as:  ALPHAGAN Place 1 drop into the left eye 2 (two) times daily.   busPIRone Rollins MG tablet Commonly known as:  BUSPAR Take Rollins  mg by mouth 3 (three) times daily.   cholecalciferol 1000 units tablet Commonly known as:  VITAMIN D Take 2,000 Units by mouth daily.   cloNIDine 0.2 MG tablet Commonly known as:  CATAPRES Take 1 tablet (0.2 mg total) by mouth 2 (two) times daily.   clopidogrel 75 MG tablet Commonly known as:  PLAVIX Take 75 mg by mouth at bedtime.   dorzolamide-timolol 22.3-6.8 MG/ML ophthalmic solution Commonly known as:  COSOPT Place 1 drop into both eyes 2 (two) times daily.   finasteride 5 MG tablet Commonly known as:  PROSCAR Take 1 tablet (5 mg total) by mouth daily.   glipiZIDE 5 MG tablet Commonly known as:  GLUCOTROL Take 5 mg by mouth 2 (two) times daily before a meal.   guaiFENesin-dextromethorphan 100-10 MG/5ML syrup Commonly known as:  ROBITUSSIN DM Take 5 mLs by mouth every 4 (four) hours as needed for cough.   latanoprost 0.005 % ophthalmic solution Commonly known as:  XALATAN Place 1 drop into both eyes at bedtime.   levothyroxine 75 MCG tablet Commonly known as:  SYNTHROID, LEVOTHROID Take 75 mcg by mouth daily before  breakfast.   losartan 50 MG tablet Commonly known as:  COZAAR Take 50 mg by mouth 2 (two) times daily.   Melatonin 5 MG Tabs Take 1 tablet by mouth.   metFORMIN 1000 MG tablet Commonly known as:  GLUCOPHAGE Take 1,000 mg by mouth 2 (two) times daily with a meal.   metoprolol tartrate 25 MG tablet Commonly known as:  LOPRESSOR Take 25 mg by mouth 2 (two) times daily.   nystatin powder Commonly known as:  MYCOSTATIN/NYSTOP Apply 1 g topically 2 (two) times daily as needed (for rash).   pantoprazole 40 MG tablet Commonly known as:  PROTONIX Take 1 tablet (40 mg total) by mouth daily.   PRESERVISION AREDS 2 Caps Take 1 capsule by mouth 2 (two) times daily.   QUEtiapine 25 MG tablet Commonly known as:  SEROQUEL Take 25-50 mg by mouth at bedtime as needed (for sleep).   tamsulosin 0.4 MG Caps capsule Commonly known as:  FLOMAX Take 1 capsule (0.4 mg total) by mouth daily after supper.   vitamin B-12 1000 MCG tablet Commonly known as:  CYANOCOBALAMIN Take 1,000 mcg by mouth at bedtime.       Allergies:  Allergies  Allergen Reactions  . Tylenol [Acetaminophen] Other (See Comments)    Reaction:  Unknown     Family History: Family History  Problem Relation Age of Onset  . Diabetes Mother   . Hypertension Mother   . Prostate cancer Neg Hx   . Bladder Cancer Neg Hx   . Kidney disease Neg Hx     Social History:  reports that he quit smoking about 42 years ago. He has quit using smokeless tobacco. He reports that he does not drink alcohol or use drugs.  ROS: UROLOGY Frequent Urination?: No Hard to postpone urination?: No Burning/pain with urination?: No Get up at night to urinate?: No Leakage of urine?: No Urine stream starts and stops?: No Trouble starting stream?: No Do you have to strain to urinate?: No Blood in urine?: No Urinary tract infection?: No Sexually transmitted disease?: No Injury to kidneys or bladder?: No Painful intercourse?: No Weak  stream?: No Erection problems?: No Penile pain?: No  Gastrointestinal Nausea?: No Vomiting?: No Indigestion/heartburn?: No Diarrhea?: No Constipation?: No  Constitutional Fever: No Night sweats?: No Weight loss?: No Fatigue?: No  Skin Skin rash/lesions?: No Itching?: No  Eyes Blurred vision?: No Double vision?: No  Ears/Nose/Throat Sore throat?: No Sinus problems?: No  Hematologic/Lymphatic Swollen glands?: No Easy bruising?: No  Cardiovascular Leg swelling?: No Chest pain?: No  Respiratory Cough?: No Shortness of breath?: No  Endocrine Excessive thirst?: No  Musculoskeletal Back pain?: No Joint pain?: No  Neurological Headaches?: No Dizziness?: No  Psychologic Depression?: No Anxiety?: No  Physical Exam: BP 128/68 (BP Location: Left Arm, Patient Position: Sitting, Cuff Size: Normal)   Pulse 60   Ht 5\' 10"  (1.778 m)   Wt 207 lb 3.2 oz (94 kg)   BMI 29.73 kg/m   Constitutional: Well nourished. Alert and oriented, No acute distress. HEENT: [place] AT, moist mucus membranes. Trachea midline, no masses. Cardiovascular: No clubbing, cyanosis, or edema. Respiratory: Normal respiratory effort, no increased work of breathing. Skin: No rashes, bruises or suspicious lesions. Lymph: No cervical or inguinal adenopathy. Neurologic: Grossly intact, no focal deficits, moving all 4 extremities. Psychiatric: Normal mood and affect.  Laboratory Data: Results for orders placed or performed in visit on 12/07/15  Microscopic Examination  Result Value Ref Range   WBC, UA 0-5 0 - 5 /hpf   RBC, UA >30 (A) 0 - 2 /hpf   Epithelial Cells (non renal) 0-10 0 - 10 /hpf   Bacteria, UA None seen None seen/Few  Urinalysis, Complete  Result Value Ref Range   Specific Gravity, UA 1.015 1.005 - 1.030   pH, UA 6.0 5.0 - 7.5   Color, UA Yellow Yellow   Appearance Ur Cloudy (A) Clear   Leukocytes, UA Negative Negative   Protein, UA 1+ (A) Negative/Trace   Glucose, UA  Negative Negative   Ketones, UA Negative Negative   RBC, UA 3+ (A) Negative   Bilirubin, UA Negative Negative   Urobilinogen, Ur 1.0 0.2 - 1.0 mg/dL   Nitrite, UA Negative Negative   Microscopic Examination See below:    Lab Results  Component Value Date   WBC 10.3 09/18/2015   HGB 12.6 (L) 09/18/2015   HCT 36.8 (L) 09/18/2015   MCV 90.4 09/18/2015   PLT 130 (L) 09/18/2015    Lab Results  Component Value Date   CREATININE 0.93 09/18/2015     Lab Results  Component Value Date   HGBA1C 5.6 08/24/2015      Assessment & Plan:    1. BPH (benign prostatic hyperplasia) with LUTS:   IPSS 5/2.  PSA of 0.7 on 07/23/2014.  He will continue the finasteride.  He will follow-up in one year's time for IPSS score and PVR.  2. History of nephrolithiasis:   Right lower ureteral stone passed spontaneously on 12/2015.   Return in about 1 year (around 01/24/2017) for IPSS and exam.  Zara Council, [facility] [address] [place], [place] [address] [phone]

## 2016-02-15 DIAGNOSIS — E109 Type 1 diabetes mellitus without complications: Secondary | ICD-10-CM | POA: Diagnosis not present

## 2016-02-15 DIAGNOSIS — E782 Mixed hyperlipidemia: Secondary | ICD-10-CM | POA: Diagnosis not present

## 2016-02-15 DIAGNOSIS — I10 Essential (primary) hypertension: Secondary | ICD-10-CM | POA: Diagnosis not present

## 2016-02-15 DIAGNOSIS — Z125 Encounter for screening for malignant neoplasm of prostate: Secondary | ICD-10-CM | POA: Diagnosis not present

## 2016-02-15 DIAGNOSIS — Z79899 Other long term (current) drug therapy: Secondary | ICD-10-CM | POA: Diagnosis not present

## 2016-02-15 DIAGNOSIS — G63 Polyneuropathy in diseases classified elsewhere: Secondary | ICD-10-CM | POA: Diagnosis not present

## 2016-02-15 DIAGNOSIS — L989 Disorder of the skin and subcutaneous tissue, unspecified: Secondary | ICD-10-CM | POA: Diagnosis not present

## 2016-02-15 DIAGNOSIS — E039 Hypothyroidism, unspecified: Secondary | ICD-10-CM | POA: Diagnosis not present

## 2016-04-14 DIAGNOSIS — Z23 Encounter for immunization: Secondary | ICD-10-CM | POA: Diagnosis not present

## 2016-04-20 DIAGNOSIS — H401133 Primary open-angle glaucoma, bilateral, severe stage: Secondary | ICD-10-CM | POA: Diagnosis not present

## 2016-05-16 DIAGNOSIS — E782 Mixed hyperlipidemia: Secondary | ICD-10-CM | POA: Diagnosis not present

## 2016-05-16 DIAGNOSIS — E1142 Type 2 diabetes mellitus with diabetic polyneuropathy: Secondary | ICD-10-CM | POA: Diagnosis not present

## 2016-05-16 DIAGNOSIS — I739 Peripheral vascular disease, unspecified: Secondary | ICD-10-CM | POA: Diagnosis not present

## 2016-05-16 DIAGNOSIS — Z79899 Other long term (current) drug therapy: Secondary | ICD-10-CM | POA: Diagnosis not present

## 2016-05-16 DIAGNOSIS — I10 Essential (primary) hypertension: Secondary | ICD-10-CM | POA: Diagnosis not present

## 2016-05-16 DIAGNOSIS — Z0000 Encounter for general adult medical examination without abnormal findings: Secondary | ICD-10-CM | POA: Diagnosis not present

## 2016-05-16 DIAGNOSIS — E039 Hypothyroidism, unspecified: Secondary | ICD-10-CM | POA: Diagnosis not present

## 2016-08-10 DIAGNOSIS — E039 Hypothyroidism, unspecified: Secondary | ICD-10-CM | POA: Diagnosis not present

## 2016-08-10 DIAGNOSIS — Z79899 Other long term (current) drug therapy: Secondary | ICD-10-CM | POA: Diagnosis not present

## 2016-08-10 DIAGNOSIS — E782 Mixed hyperlipidemia: Secondary | ICD-10-CM | POA: Diagnosis not present

## 2016-08-10 DIAGNOSIS — I10 Essential (primary) hypertension: Secondary | ICD-10-CM | POA: Diagnosis not present

## 2016-08-15 DIAGNOSIS — E1142 Type 2 diabetes mellitus with diabetic polyneuropathy: Secondary | ICD-10-CM | POA: Diagnosis not present

## 2016-08-15 DIAGNOSIS — E782 Mixed hyperlipidemia: Secondary | ICD-10-CM | POA: Diagnosis not present

## 2016-08-15 DIAGNOSIS — F5104 Psychophysiologic insomnia: Secondary | ICD-10-CM | POA: Diagnosis not present

## 2016-08-15 DIAGNOSIS — I10 Essential (primary) hypertension: Secondary | ICD-10-CM | POA: Diagnosis not present

## 2016-08-15 DIAGNOSIS — R0781 Pleurodynia: Secondary | ICD-10-CM | POA: Diagnosis not present

## 2016-08-15 DIAGNOSIS — E039 Hypothyroidism, unspecified: Secondary | ICD-10-CM | POA: Diagnosis not present

## 2016-08-17 DIAGNOSIS — H401133 Primary open-angle glaucoma, bilateral, severe stage: Secondary | ICD-10-CM | POA: Diagnosis not present

## 2016-08-19 DIAGNOSIS — L570 Actinic keratosis: Secondary | ICD-10-CM | POA: Diagnosis not present

## 2016-08-19 DIAGNOSIS — C44311 Basal cell carcinoma of skin of nose: Secondary | ICD-10-CM | POA: Diagnosis not present

## 2016-08-19 DIAGNOSIS — L578 Other skin changes due to chronic exposure to nonionizing radiation: Secondary | ICD-10-CM | POA: Diagnosis not present

## 2016-08-19 DIAGNOSIS — C44219 Basal cell carcinoma of skin of left ear and external auricular canal: Secondary | ICD-10-CM | POA: Diagnosis not present

## 2016-08-19 DIAGNOSIS — C44319 Basal cell carcinoma of skin of other parts of face: Secondary | ICD-10-CM | POA: Diagnosis not present

## 2016-08-19 DIAGNOSIS — L400 Psoriasis vulgaris: Secondary | ICD-10-CM | POA: Diagnosis not present

## 2016-11-14 DIAGNOSIS — E039 Hypothyroidism, unspecified: Secondary | ICD-10-CM | POA: Diagnosis not present

## 2016-11-14 DIAGNOSIS — I10 Essential (primary) hypertension: Secondary | ICD-10-CM | POA: Diagnosis not present

## 2016-11-14 DIAGNOSIS — I517 Cardiomegaly: Secondary | ICD-10-CM | POA: Diagnosis not present

## 2016-11-14 DIAGNOSIS — R350 Frequency of micturition: Secondary | ICD-10-CM | POA: Diagnosis not present

## 2016-11-14 DIAGNOSIS — Z8673 Personal history of transient ischemic attack (TIA), and cerebral infarction without residual deficits: Secondary | ICD-10-CM | POA: Diagnosis not present

## 2016-11-14 DIAGNOSIS — E1142 Type 2 diabetes mellitus with diabetic polyneuropathy: Secondary | ICD-10-CM | POA: Diagnosis not present

## 2016-11-14 DIAGNOSIS — Z79899 Other long term (current) drug therapy: Secondary | ICD-10-CM | POA: Diagnosis not present

## 2016-11-14 DIAGNOSIS — R634 Abnormal weight loss: Secondary | ICD-10-CM | POA: Diagnosis not present

## 2016-11-16 DIAGNOSIS — E039 Hypothyroidism, unspecified: Secondary | ICD-10-CM | POA: Diagnosis not present

## 2016-11-16 DIAGNOSIS — E1142 Type 2 diabetes mellitus with diabetic polyneuropathy: Secondary | ICD-10-CM | POA: Diagnosis not present

## 2016-11-16 DIAGNOSIS — Z8673 Personal history of transient ischemic attack (TIA), and cerebral infarction without residual deficits: Secondary | ICD-10-CM | POA: Diagnosis not present

## 2016-11-16 DIAGNOSIS — I10 Essential (primary) hypertension: Secondary | ICD-10-CM | POA: Diagnosis not present

## 2016-11-16 DIAGNOSIS — R2689 Other abnormalities of gait and mobility: Secondary | ICD-10-CM | POA: Diagnosis not present

## 2016-11-16 DIAGNOSIS — H401133 Primary open-angle glaucoma, bilateral, severe stage: Secondary | ICD-10-CM | POA: Diagnosis not present

## 2016-11-16 DIAGNOSIS — H548 Legal blindness, as defined in USA: Secondary | ICD-10-CM | POA: Diagnosis not present

## 2016-11-16 DIAGNOSIS — I739 Peripheral vascular disease, unspecified: Secondary | ICD-10-CM | POA: Diagnosis not present

## 2016-11-16 DIAGNOSIS — R634 Abnormal weight loss: Secondary | ICD-10-CM | POA: Diagnosis not present

## 2016-11-16 DIAGNOSIS — Z9181 History of falling: Secondary | ICD-10-CM | POA: Diagnosis not present

## 2016-11-16 DIAGNOSIS — M6281 Muscle weakness (generalized): Secondary | ICD-10-CM | POA: Diagnosis not present

## 2016-11-21 DIAGNOSIS — R634 Abnormal weight loss: Secondary | ICD-10-CM | POA: Diagnosis not present

## 2016-11-21 DIAGNOSIS — Z8673 Personal history of transient ischemic attack (TIA), and cerebral infarction without residual deficits: Secondary | ICD-10-CM | POA: Diagnosis not present

## 2016-11-21 DIAGNOSIS — H548 Legal blindness, as defined in USA: Secondary | ICD-10-CM | POA: Diagnosis not present

## 2016-11-21 DIAGNOSIS — E039 Hypothyroidism, unspecified: Secondary | ICD-10-CM | POA: Diagnosis not present

## 2016-11-21 DIAGNOSIS — Z9181 History of falling: Secondary | ICD-10-CM | POA: Diagnosis not present

## 2016-11-21 DIAGNOSIS — M6281 Muscle weakness (generalized): Secondary | ICD-10-CM | POA: Diagnosis not present

## 2016-11-21 DIAGNOSIS — I739 Peripheral vascular disease, unspecified: Secondary | ICD-10-CM | POA: Diagnosis not present

## 2016-11-21 DIAGNOSIS — R2689 Other abnormalities of gait and mobility: Secondary | ICD-10-CM | POA: Diagnosis not present

## 2016-11-21 DIAGNOSIS — E1142 Type 2 diabetes mellitus with diabetic polyneuropathy: Secondary | ICD-10-CM | POA: Diagnosis not present

## 2016-11-21 DIAGNOSIS — I10 Essential (primary) hypertension: Secondary | ICD-10-CM | POA: Diagnosis not present

## 2016-11-25 DIAGNOSIS — I10 Essential (primary) hypertension: Secondary | ICD-10-CM | POA: Diagnosis not present

## 2016-11-25 DIAGNOSIS — Z8669 Personal history of other diseases of the nervous system and sense organs: Secondary | ICD-10-CM | POA: Diagnosis not present

## 2016-11-25 DIAGNOSIS — E782 Mixed hyperlipidemia: Secondary | ICD-10-CM | POA: Diagnosis not present

## 2016-11-25 DIAGNOSIS — I350 Nonrheumatic aortic (valve) stenosis: Secondary | ICD-10-CM | POA: Diagnosis not present

## 2016-11-25 DIAGNOSIS — R0602 Shortness of breath: Secondary | ICD-10-CM | POA: Diagnosis not present

## 2016-12-02 DIAGNOSIS — R634 Abnormal weight loss: Secondary | ICD-10-CM | POA: Diagnosis not present

## 2016-12-02 DIAGNOSIS — I739 Peripheral vascular disease, unspecified: Secondary | ICD-10-CM | POA: Diagnosis not present

## 2016-12-02 DIAGNOSIS — Z8673 Personal history of transient ischemic attack (TIA), and cerebral infarction without residual deficits: Secondary | ICD-10-CM | POA: Diagnosis not present

## 2016-12-02 DIAGNOSIS — M6281 Muscle weakness (generalized): Secondary | ICD-10-CM | POA: Diagnosis not present

## 2016-12-02 DIAGNOSIS — E1142 Type 2 diabetes mellitus with diabetic polyneuropathy: Secondary | ICD-10-CM | POA: Diagnosis not present

## 2016-12-02 DIAGNOSIS — Z9181 History of falling: Secondary | ICD-10-CM | POA: Diagnosis not present

## 2016-12-02 DIAGNOSIS — I10 Essential (primary) hypertension: Secondary | ICD-10-CM | POA: Diagnosis not present

## 2016-12-02 DIAGNOSIS — R2689 Other abnormalities of gait and mobility: Secondary | ICD-10-CM | POA: Diagnosis not present

## 2016-12-02 DIAGNOSIS — E039 Hypothyroidism, unspecified: Secondary | ICD-10-CM | POA: Diagnosis not present

## 2016-12-02 DIAGNOSIS — H548 Legal blindness, as defined in USA: Secondary | ICD-10-CM | POA: Diagnosis not present

## 2016-12-12 DIAGNOSIS — L820 Inflamed seborrheic keratosis: Secondary | ICD-10-CM | POA: Diagnosis not present

## 2016-12-12 DIAGNOSIS — C4441 Basal cell carcinoma of skin of scalp and neck: Secondary | ICD-10-CM | POA: Diagnosis not present

## 2016-12-12 DIAGNOSIS — Z85828 Personal history of other malignant neoplasm of skin: Secondary | ICD-10-CM | POA: Diagnosis not present

## 2016-12-12 DIAGNOSIS — L578 Other skin changes due to chronic exposure to nonionizing radiation: Secondary | ICD-10-CM | POA: Diagnosis not present

## 2016-12-12 DIAGNOSIS — C44219 Basal cell carcinoma of skin of left ear and external auricular canal: Secondary | ICD-10-CM | POA: Diagnosis not present

## 2016-12-14 DIAGNOSIS — I739 Peripheral vascular disease, unspecified: Secondary | ICD-10-CM | POA: Diagnosis not present

## 2016-12-14 DIAGNOSIS — I10 Essential (primary) hypertension: Secondary | ICD-10-CM | POA: Diagnosis not present

## 2016-12-14 DIAGNOSIS — Z9181 History of falling: Secondary | ICD-10-CM | POA: Diagnosis not present

## 2016-12-14 DIAGNOSIS — R2689 Other abnormalities of gait and mobility: Secondary | ICD-10-CM | POA: Diagnosis not present

## 2016-12-14 DIAGNOSIS — R634 Abnormal weight loss: Secondary | ICD-10-CM | POA: Diagnosis not present

## 2016-12-14 DIAGNOSIS — M6281 Muscle weakness (generalized): Secondary | ICD-10-CM | POA: Diagnosis not present

## 2016-12-14 DIAGNOSIS — H548 Legal blindness, as defined in USA: Secondary | ICD-10-CM | POA: Diagnosis not present

## 2016-12-14 DIAGNOSIS — E039 Hypothyroidism, unspecified: Secondary | ICD-10-CM | POA: Diagnosis not present

## 2016-12-14 DIAGNOSIS — E1142 Type 2 diabetes mellitus with diabetic polyneuropathy: Secondary | ICD-10-CM | POA: Diagnosis not present

## 2016-12-14 DIAGNOSIS — Z8673 Personal history of transient ischemic attack (TIA), and cerebral infarction without residual deficits: Secondary | ICD-10-CM | POA: Diagnosis not present

## 2016-12-20 DIAGNOSIS — E1142 Type 2 diabetes mellitus with diabetic polyneuropathy: Secondary | ICD-10-CM | POA: Diagnosis not present

## 2016-12-20 DIAGNOSIS — Z9181 History of falling: Secondary | ICD-10-CM | POA: Diagnosis not present

## 2016-12-20 DIAGNOSIS — M6281 Muscle weakness (generalized): Secondary | ICD-10-CM | POA: Diagnosis not present

## 2016-12-20 DIAGNOSIS — H548 Legal blindness, as defined in USA: Secondary | ICD-10-CM | POA: Diagnosis not present

## 2016-12-20 DIAGNOSIS — Z8673 Personal history of transient ischemic attack (TIA), and cerebral infarction without residual deficits: Secondary | ICD-10-CM | POA: Diagnosis not present

## 2016-12-20 DIAGNOSIS — I739 Peripheral vascular disease, unspecified: Secondary | ICD-10-CM | POA: Diagnosis not present

## 2016-12-20 DIAGNOSIS — I10 Essential (primary) hypertension: Secondary | ICD-10-CM | POA: Diagnosis not present

## 2016-12-20 DIAGNOSIS — R634 Abnormal weight loss: Secondary | ICD-10-CM | POA: Diagnosis not present

## 2016-12-20 DIAGNOSIS — E039 Hypothyroidism, unspecified: Secondary | ICD-10-CM | POA: Diagnosis not present

## 2016-12-20 DIAGNOSIS — R2689 Other abnormalities of gait and mobility: Secondary | ICD-10-CM | POA: Diagnosis not present

## 2016-12-21 ENCOUNTER — Emergency Department
Admission: EM | Admit: 2016-12-21 | Discharge: 2016-12-21 | Disposition: A | Discharge status: Home / Self Care | Payer: PPO | Attending: Emergency Medicine | Admitting: Emergency Medicine

## 2016-12-21 ENCOUNTER — Encounter: Payer: Self-pay | Admitting: Emergency Medicine

## 2016-12-21 DIAGNOSIS — Z9049 Acquired absence of other specified parts of digestive tract: Secondary | ICD-10-CM | POA: Insufficient documentation

## 2016-12-21 DIAGNOSIS — I10 Essential (primary) hypertension: Secondary | ICD-10-CM | POA: Diagnosis not present

## 2016-12-21 DIAGNOSIS — E119 Type 2 diabetes mellitus without complications: Secondary | ICD-10-CM | POA: Insufficient documentation

## 2016-12-21 DIAGNOSIS — R531 Weakness: Secondary | ICD-10-CM | POA: Insufficient documentation

## 2016-12-21 DIAGNOSIS — E039 Hypothyroidism, unspecified: Secondary | ICD-10-CM | POA: Insufficient documentation

## 2016-12-21 DIAGNOSIS — Z8673 Personal history of transient ischemic attack (TIA), and cerebral infarction without residual deficits: Secondary | ICD-10-CM | POA: Insufficient documentation

## 2016-12-21 DIAGNOSIS — R404 Transient alteration of awareness: Secondary | ICD-10-CM | POA: Diagnosis not present

## 2016-12-21 DIAGNOSIS — M6281 Muscle weakness (generalized): Secondary | ICD-10-CM | POA: Diagnosis not present

## 2016-12-21 DIAGNOSIS — Z87891 Personal history of nicotine dependence: Secondary | ICD-10-CM | POA: Insufficient documentation

## 2016-12-21 LAB — COMPREHENSIVE METABOLIC PANEL
ALT: 16 U/L — ABNORMAL LOW (ref 17–63)
AST: 19 U/L (ref 15–41)
Albumin: 3.8 g/dL (ref 3.5–5.0)
Alkaline Phosphatase: 56 U/L (ref 38–126)
Anion gap: 7 (ref 5–15)
BILIRUBIN TOTAL: 0.9 mg/dL (ref 0.3–1.2)
BUN: 28 mg/dL — AB (ref 6–20)
CHLORIDE: 105 mmol/L (ref 101–111)
CO2: 26 mmol/L (ref 22–32)
Calcium: 8.7 mg/dL — ABNORMAL LOW (ref 8.9–10.3)
Creatinine, Ser: 1.2 mg/dL (ref 0.61–1.24)
GFR, EST NON AFRICAN AMERICAN: 56 mL/min — AB (ref >60)
Glucose, Bld: 123 mg/dL — ABNORMAL HIGH (ref 65–99)
POTASSIUM: 3.6 mmol/L (ref 3.5–5.1)
Sodium: 138 mmol/L (ref 135–145)
TOTAL PROTEIN: 6.1 g/dL — AB (ref 6.5–8.1)

## 2016-12-21 LAB — TROPONIN I

## 2016-12-21 LAB — URINALYSIS, COMPLETE (UACMP) WITH MICROSCOPIC
BACTERIA UA: NONE SEEN
Bilirubin Urine: NEGATIVE
Glucose, UA: NEGATIVE mg/dL
Hgb urine dipstick: NEGATIVE
LEUKOCYTES UA: NEGATIVE
Nitrite: NEGATIVE
PH: 5 (ref 5.0–8.0)
Protein, ur: NEGATIVE mg/dL
SPECIFIC GRAVITY, URINE: 1.02 (ref 1.005–1.030)
Squamous Epithelial / LPF: NONE SEEN

## 2016-12-21 LAB — CBC WITH DIFFERENTIAL/PLATELET
BASOS ABS: 0.1 10*3/uL (ref 0–0.1)
Basophils Relative: 0 %
Eosinophils Absolute: 0.2 10*3/uL (ref 0–0.7)
Eosinophils Relative: 1 %
HEMATOCRIT: 38.1 % — AB (ref 40.0–52.0)
Hemoglobin: 12.7 g/dL — ABNORMAL LOW (ref 13.0–18.0)
LYMPHS ABS: 5 10*3/uL — AB (ref 1.0–3.6)
LYMPHS PCT: 40 %
MCH: 29.9 pg (ref 26.0–34.0)
MCHC: 33.5 g/dL (ref 32.0–36.0)
MCV: 89.3 fL (ref 80.0–100.0)
MONO ABS: 0.7 10*3/uL (ref 0.2–1.0)
MONOS PCT: 5 %
Neutro Abs: 6.8 10*3/uL — ABNORMAL HIGH (ref 1.4–6.5)
Neutrophils Relative %: 54 %
PLATELETS: 102 10*3/uL — AB (ref 150–440)
RBC: 4.26 MIL/uL — ABNORMAL LOW (ref 4.40–5.90)
RDW: 15.7 % — AB (ref 11.5–14.5)
WBC: 12.7 10*3/uL — ABNORMAL HIGH (ref 3.8–10.6)

## 2016-12-21 MED ORDER — SODIUM CHLORIDE 0.9 % IV SOLN
1000.0000 mL | Freq: Once | INTRAVENOUS | Status: AC
  Administered 2016-12-21: 1000 mL via INTRAVENOUS

## 2016-12-21 NOTE — ED Provider Notes (Signed)
[facility] Emergency Department Provider Note       Time seen: ----------------------------------------- 8:56 AM on 12/21/2016 -----------------------------------------     I have reviewed the triage vital signs and the nursing notes.   HISTORY   Chief Complaint No chief complaint on file.    HPI Chad Rollins is a 81 y.o. male who presents to the ED for generalized weakness. Caregiver called EMS because he was weaker than normal. EMS arrived to find the patient awake and alert when they stood him up to get onto the stretcher he had some bradycardia and vomited. Subsequently he has not had further vomiting and his only complaint is weakness. This appeared to have started this morning.   Past Medical History:  Diagnosis Date  . Allergic rhinitis   . Aortic stenosis   . BPH with obstruction/lower urinary tract symptoms   . Collagen vascular disease (HCC)    Rheumatoid Arthritis  . Continuous right lower quadrant pain   . Diabetes mellitus ([place])   . HLD (hyperlipidemia)   . HTN (hypertension)   . Hypothyroidism   . Injury of right rotator cuff   . Obesity   . Osteoarthritis   . Peripheral neuropathy ([place])   . Rheumatoid arthritis ([place])   . Stroke [facility])     Patient Active Problem List   Diagnosis Date Noted  . Chest pain 09/17/2015  . Elevated troponin 08/24/2015  . H/O adenomatous polyp of colon 03/06/2015  . BPH with obstruction/lower urinary tract symptoms 01/21/2015  . Right lower quadrant abdominal pain 01/21/2015  . Glaucoma 12/08/2014  . Type 2 diabetes mellitus ([place]) 12/08/2014  . Insomnia, persistent 12/16/2013  . Peripheral arterial occlusive disease ([place]) 12/16/2013  . Benign fibroma of prostate 12/14/2013  . H/O respiratory system disease 12/14/2013  . Cerebrovascular accident, old 12/14/2013  . HLD (hyperlipidemia) 12/14/2013  . Adult hypothyroidism 12/14/2013  . Arthritis, degenerative 12/14/2013  . Adiposity  12/14/2013  . Peripheral nerve disease 12/14/2013  . Aortic heart valve narrowing 08/13/2013  . 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 Difficulty hearing 08/12/2013  . BP (high blood pressure) 08/12/2013    Past Surgical History:  Procedure Laterality Date  . CHOLECYSTECTOMY    . Lipoma excisions    . Surgery for sleep apnea    . TONSILLECTOMY      Allergies Tylenol [acetaminophen]  Social History Social History  Substance Use Topics  . Smoking status: Former Smoker    Quit date: 10/14/1973  . Smokeless tobacco: Former Systems developer     Comment: quit 1975  . Alcohol use No    Review of Systems Constitutional: Negative for fever. Cardiovascular: Negative for chest pain. Respiratory: Negative for shortness of breath. Gastrointestinal: Negative for abdominal pain, vomiting and diarrhea. Genitourinary: Negative for dysuria. Musculoskeletal: Negative for back pain. Skin: Negative for rash. Neurological: Positive for generalized weakness  All systems negative/normal/unremarkable except as stated in the HPI  ____________________________________________   PHYSICAL EXAM:  VITAL SIGNS: ED Triage Vitals  Enc Vitals Group     BP      Pulse      Resp      Temp      Temp src      SpO2      Weight      Height      Head Circumference      Peak Flow      Pain Score      Pain Loc  Pain Edu?      Excl. in [place]?     Constitutional: Lethargic, chronically ill-appearing and in no distress. Eyes: Conjunctivae are normal. Normal extraocular movements. ENT   Head: Normocephalic and atraumatic.   Nose: No congestion/rhinnorhea.   Mouth/Throat: Mucous membranes are moist.   Neck: No stridor. Cardiovascular: Normal rate, regular rhythm. Murmur is noted Respiratory: Normal respiratory effort without tachypnea nor retractions. Breath sounds are clear and equal bilaterally. No wheezes/rales/rhonchi. Gastrointestinal: Soft and nontender. Normal  bowel sounds Musculoskeletal: Nontender with normal range of motion in extremities. No lower extremity tenderness nor edema. Neurologic:  Normal speech and language. No gross focal neurologic deficits are appreciated. Generalized weakness, nothing focal Skin:  Skin is warm, dry and intact. No rash noted. Psychiatric: Patient is lethargic but otherwise speech and behavior seemed normal ____________________________________________  EKG: Interpreted by me. Sinus rhythm throughout his extremities per minute, prolonged PR interval, normal QRS, normal QT.  ____________________________________________  ED COURSE:  Pertinent labs & imaging results that were available during my care of the patient were reviewed by me and considered in my medical decision making (see chart for details). Patient presents for weakness, we will assess with labs and imaging as indicated.   Procedures ____________________________________________   LABS (pertinent positives/negatives)  Labs Reviewed  CBC WITH DIFFERENTIAL/PLATELET - Abnormal; Notable for the following:       Result Value   WBC 12.7 (*)    RBC 4.26 (*)    Hemoglobin 12.7 (*)    HCT 38.1 (*)    RDW 15.7 (*)    Platelets 102 (*)    Neutro Abs 6.8 (*)    Lymphs Abs 5.0 (*)    All other components within normal limits  COMPREHENSIVE METABOLIC PANEL - Abnormal; Notable for the following:    Glucose, Bld 123 (*)    BUN 28 (*)    Calcium 8.7 (*)    Total Protein 6.1 (*)    ALT 16 (*)    GFR calc non Af Amer 56 (*)    All other components within normal limits  URINALYSIS, COMPLETE (UACMP) WITH MICROSCOPIC - Abnormal; Notable for the following:    Ketones, ur TRACE (*)    All other components within normal limits  TROPONIN I  CBG MONITORING, ED   ___________________________________________  FINAL ASSESSMENT AND PLAN  Weakness  Plan: Patient's labs and imaging were dictated above. Patient had presented for Weakness and lethargy of  uncertain etiology. I suspect this may be medication related. He has returned to his baseline according to family and we have not found any other source of his symptoms. He is stable for outpatient follow-up with his doctor.   Earleen Newport, MD   Note: This note was generated in part or whole with voice recognition software. Voice recognition is usually quite accurate but there are transcription errors that can and very often do occur. I apologize for any typographical errors that were not detected and corrected.     Earleen Newport, MD 12/21/16 [phone]

## 2016-12-21 NOTE — ED Triage Notes (Signed)
Patient from home via [facility]. Care giver called EMS and reports patient with increased weakness and lethargy starting today. Reports patient is usually active and talkative and this morning noticed he had no energy and was having difficulty staying awake Per EMS, patient had an episode of bradycardia and vomiting upon standing to get on stretcher. . Patient alert and oriented upon arrival to ED. Patient denies pain at this time.

## 2016-12-21 NOTE — ED Notes (Signed)
Patient assisted to use urinal at bedside. Patient able to stand with assistance. No significant change in vitals noted. Urine specimen obtained and sent to lab.

## 2016-12-23 DIAGNOSIS — Z9181 History of falling: Secondary | ICD-10-CM | POA: Diagnosis not present

## 2016-12-23 DIAGNOSIS — Z8673 Personal history of transient ischemic attack (TIA), and cerebral infarction without residual deficits: Secondary | ICD-10-CM | POA: Diagnosis not present

## 2016-12-23 DIAGNOSIS — E1142 Type 2 diabetes mellitus with diabetic polyneuropathy: Secondary | ICD-10-CM | POA: Diagnosis not present

## 2016-12-23 DIAGNOSIS — I739 Peripheral vascular disease, unspecified: Secondary | ICD-10-CM | POA: Diagnosis not present

## 2016-12-23 DIAGNOSIS — R634 Abnormal weight loss: Secondary | ICD-10-CM | POA: Diagnosis not present

## 2016-12-23 DIAGNOSIS — I10 Essential (primary) hypertension: Secondary | ICD-10-CM | POA: Diagnosis not present

## 2016-12-23 DIAGNOSIS — H548 Legal blindness, as defined in USA: Secondary | ICD-10-CM | POA: Diagnosis not present

## 2016-12-23 DIAGNOSIS — R2689 Other abnormalities of gait and mobility: Secondary | ICD-10-CM | POA: Diagnosis not present

## 2016-12-23 DIAGNOSIS — E039 Hypothyroidism, unspecified: Secondary | ICD-10-CM | POA: Diagnosis not present

## 2016-12-23 DIAGNOSIS — M6281 Muscle weakness (generalized): Secondary | ICD-10-CM | POA: Diagnosis not present

## 2016-12-26 DIAGNOSIS — Z8673 Personal history of transient ischemic attack (TIA), and cerebral infarction without residual deficits: Secondary | ICD-10-CM | POA: Diagnosis not present

## 2016-12-26 DIAGNOSIS — M6281 Muscle weakness (generalized): Secondary | ICD-10-CM | POA: Diagnosis not present

## 2016-12-26 DIAGNOSIS — I739 Peripheral vascular disease, unspecified: Secondary | ICD-10-CM | POA: Diagnosis not present

## 2016-12-26 DIAGNOSIS — H548 Legal blindness, as defined in USA: Secondary | ICD-10-CM | POA: Diagnosis not present

## 2016-12-26 DIAGNOSIS — I10 Essential (primary) hypertension: Secondary | ICD-10-CM | POA: Diagnosis not present

## 2016-12-26 DIAGNOSIS — Z79899 Other long term (current) drug therapy: Secondary | ICD-10-CM | POA: Diagnosis not present

## 2016-12-26 DIAGNOSIS — R634 Abnormal weight loss: Secondary | ICD-10-CM | POA: Diagnosis not present

## 2016-12-26 DIAGNOSIS — E039 Hypothyroidism, unspecified: Secondary | ICD-10-CM | POA: Diagnosis not present

## 2016-12-26 DIAGNOSIS — Z9181 History of falling: Secondary | ICD-10-CM | POA: Diagnosis not present

## 2016-12-26 DIAGNOSIS — E1142 Type 2 diabetes mellitus with diabetic polyneuropathy: Secondary | ICD-10-CM | POA: Diagnosis not present

## 2016-12-26 DIAGNOSIS — E782 Mixed hyperlipidemia: Secondary | ICD-10-CM | POA: Diagnosis not present

## 2016-12-26 DIAGNOSIS — R2689 Other abnormalities of gait and mobility: Secondary | ICD-10-CM | POA: Diagnosis not present

## 2016-12-30 DIAGNOSIS — I739 Peripheral vascular disease, unspecified: Secondary | ICD-10-CM | POA: Diagnosis not present

## 2016-12-30 DIAGNOSIS — E1142 Type 2 diabetes mellitus with diabetic polyneuropathy: Secondary | ICD-10-CM | POA: Diagnosis not present

## 2016-12-30 DIAGNOSIS — I10 Essential (primary) hypertension: Secondary | ICD-10-CM | POA: Diagnosis not present

## 2016-12-30 DIAGNOSIS — R634 Abnormal weight loss: Secondary | ICD-10-CM | POA: Diagnosis not present

## 2016-12-30 DIAGNOSIS — M6281 Muscle weakness (generalized): Secondary | ICD-10-CM | POA: Diagnosis not present

## 2017-01-03 DIAGNOSIS — Z9181 History of falling: Secondary | ICD-10-CM | POA: Diagnosis not present

## 2017-01-03 DIAGNOSIS — I739 Peripheral vascular disease, unspecified: Secondary | ICD-10-CM | POA: Diagnosis not present

## 2017-01-03 DIAGNOSIS — R634 Abnormal weight loss: Secondary | ICD-10-CM | POA: Diagnosis not present

## 2017-01-03 DIAGNOSIS — R1312 Dysphagia, oropharyngeal phase: Secondary | ICD-10-CM | POA: Diagnosis not present

## 2017-01-03 DIAGNOSIS — I10 Essential (primary) hypertension: Secondary | ICD-10-CM | POA: Diagnosis not present

## 2017-01-03 DIAGNOSIS — E1142 Type 2 diabetes mellitus with diabetic polyneuropathy: Secondary | ICD-10-CM | POA: Diagnosis not present

## 2017-01-03 DIAGNOSIS — Z7982 Long term (current) use of aspirin: Secondary | ICD-10-CM | POA: Diagnosis not present

## 2017-01-03 DIAGNOSIS — Z7984 Long term (current) use of oral hypoglycemic drugs: Secondary | ICD-10-CM | POA: Diagnosis not present

## 2017-01-03 DIAGNOSIS — R2689 Other abnormalities of gait and mobility: Secondary | ICD-10-CM | POA: Diagnosis not present

## 2017-01-03 DIAGNOSIS — M6281 Muscle weakness (generalized): Secondary | ICD-10-CM | POA: Diagnosis not present

## 2017-01-03 DIAGNOSIS — Z7902 Long term (current) use of antithrombotics/antiplatelets: Secondary | ICD-10-CM | POA: Diagnosis not present

## 2017-01-03 DIAGNOSIS — H548 Legal blindness, as defined in USA: Secondary | ICD-10-CM | POA: Diagnosis not present

## 2017-01-03 DIAGNOSIS — Z8673 Personal history of transient ischemic attack (TIA), and cerebral infarction without residual deficits: Secondary | ICD-10-CM | POA: Diagnosis not present

## 2017-01-20 DIAGNOSIS — R0602 Shortness of breath: Secondary | ICD-10-CM | POA: Diagnosis not present

## 2017-01-22 NOTE — Progress Notes (Signed)
01/23/2017 11:27 AM   Chad Rollins 1936/05/04 [id number]  Referring provider: Idelle Crouch, MD Chad Rollins [place], [place] [address]  Chief Complaint  Patient presents with  . Benign Prostatic Hypertrophy    1 year follow up    HPI: [name] is a 81 year old Caucasian male who has a BPH with LUTS and a history of nephrolithiasis who presents for a 12 months follow up.    BPH with LU TS (prostate and/or bladder) His IPSS score is 3, which is mild lower urinary tract symptomatology.  He is mostly satisfied with his quality life due to his urinary symptoms.   His previous I PSS score was 5/2.  He has not had any recent fevers, chills, nausea or vomiting. He also denies any dysuria, gross hematuria or suprapubic pain.      IPSS    Row Name 01/23/17 1100         International Prostate Symptom Score   How often have you had the sensation of not emptying your bladder? Not at All     How often have you had to urinate less than every two hours? Less than 1 in 5 times     How often have you found you stopped and started again several times when you urinated? Not at All     How often have you found it difficult to postpone urination? Not at All     How often have you had a weak urinary stream? Not at All     How often have you had to strain to start urination? Not at All     How many times did you typically get up at night to urinate? 2 Times     Total IPSS Score 3       Quality of Life due to urinary symptoms   If you were to spend the rest of your life with your urinary condition just the way it is now how would you feel about that? Mostly Satisfied        Score:  1-7 Mild 8-19 Moderate 20-35 Severe  History of nephrolithiasis Patient has not had any flank pain or gross hematuria. He has not had any passage of stones.  PMH: Past Medical History:  Diagnosis Date  . Allergic rhinitis   . Aortic stenosis   . BPH with  obstruction/lower urinary tract symptoms   . Collagen vascular disease (HCC)    Rheumatoid Arthritis  . Continuous right lower quadrant pain   . Diabetes mellitus ([place])   . HLD (hyperlipidemia)   . HTN (hypertension)   . Hypothyroidism   . Injury of right rotator cuff   . Obesity   . Osteoarthritis   . Peripheral neuropathy   . Rheumatoid arthritis ([place])   . Stroke [facility])     Surgical History: Past Surgical History:  Procedure Laterality Date  . CHOLECYSTECTOMY    . Lipoma excisions    . Surgery for sleep apnea    . TONSILLECTOMY      Home Medications:  Allergies as of 01/23/2017      Reactions   Tylenol [acetaminophen] Other (See Comments)   Reaction:  Unknown       Medication List       Accurate as of 01/23/17 11:27 AM. Always use your most recent med list.          aspirin EC 81 MG tablet Take 81 mg by mouth daily.  atorvastatin 20 MG tablet Commonly known as:  LIPITOR Take 20 mg by mouth at bedtime.   brimonidine 0.2 % ophthalmic solution Commonly known as:  ALPHAGAN Place 1 drop into the left eye 2 (two) times daily.   busPIRone 15 MG tablet Commonly known as:  BUSPAR Take 15 mg by mouth 3 (three) times daily.   carboxymethylcellulose 0.5 % Soln Commonly known as:  REFRESH PLUS Apply to eye.   ciprofloxacin 0.3 % ophthalmic solution Commonly known as:  CILOXAN Apply to eye.   cloNIDine 0.2 MG tablet Commonly known as:  CATAPRES Take 1 tablet (0.2 mg total) by mouth 2 (two) times daily.   clopidogrel 75 MG tablet Commonly known as:  PLAVIX Take 75 mg by mouth at bedtime.   dorzolamide-timolol 22.3-6.8 MG/ML ophthalmic solution Commonly known as:  COSOPT Place 1 drop into both eyes 2 (two) times daily.   finasteride 5 MG tablet Commonly known as:  PROSCAR Take 1 tablet (5 mg total) by mouth daily.   furosemide 20 MG tablet Commonly known as:  LASIX Take 20 mg by mouth daily.   glipiZIDE 5 MG tablet Commonly known as:   GLUCOTROL Take 5 mg by mouth 2 (two) times daily before a meal.   latanoprost 0.005 % ophthalmic solution Commonly known as:  XALATAN Place 1 drop into both eyes at bedtime.   levothyroxine 75 MCG tablet Commonly known as:  SYNTHROID, LEVOTHROID Take 75 mcg by mouth daily before breakfast.   losartan 50 MG tablet Commonly known as:  COZAAR Take 50 mg by mouth 2 (two) times daily.   Melatonin 5 MG Tabs Take 5 mg by mouth at bedtime.   metFORMIN 1000 MG tablet Commonly known as:  GLUCOPHAGE Take 1,000 mg by mouth 2 (two) times daily with a meal.   metoprolol tartrate 25 MG tablet Commonly known as:  LOPRESSOR Take 25 mg by mouth 2 (two) times daily.   nystatin powder Commonly known as:  MYCOSTATIN/NYSTOP Apply topically 2 (two) times daily as needed. For rash under arms   pantoprazole 40 MG tablet Commonly known as:  PROTONIX Take 1 tablet (40 mg total) by mouth daily.   prednisoLONE acetate 1 % ophthalmic suspension Commonly known as:  PRED FORTE Apply to eye.   PRESERVISION AREDS 2 Caps Take 1 capsule by mouth 2 (two) times daily.   PRESERVISION AREDS 2 PO Take by mouth.   QUEtiapine 25 MG tablet Commonly known as:  SEROQUEL Take 50 mg by mouth at bedtime.   sodium chloride 5 % ophthalmic solution Commonly known as:  MURO 128 Apply to eye.   tamsulosin 0.4 MG Caps capsule Commonly known as:  FLOMAX Take 1 capsule (0.4 mg total) by mouth daily after supper.   vitamin B-12 1000 MCG tablet Commonly known as:  CYANOCOBALAMIN Take 1,000 mcg by mouth at bedtime.   Vitamin D3 2000 units capsule Take by mouth.       Allergies:  Allergies  Allergen Reactions  . Tylenol [Acetaminophen] Other (See Comments)    Reaction:  Unknown     Family History: Family History  Problem Relation Age of Onset  . Diabetes Mother   . Hypertension Mother   . Prostate cancer Neg Hx   . Bladder Cancer Neg Hx   . Kidney disease Neg Hx     Social History:  reports  that he quit smoking about 43 years ago. He has quit using smokeless tobacco. He reports that he does not drink alcohol or  use drugs.  ROS: UROLOGY Frequent Urination?: No Hard to postpone urination?: No Burning/pain with urination?: No Get up at night to urinate?: No Leakage of urine?: No Urine stream starts and stops?: No Trouble starting stream?: No Do you have to strain to urinate?: No Blood in urine?: No Urinary tract infection?: No Sexually transmitted disease?: No Injury to kidneys or bladder?: No Painful intercourse?: No Weak stream?: No Erection problems?: No Penile pain?: No  Gastrointestinal Nausea?: No Vomiting?: No Indigestion/heartburn?: No Diarrhea?: No Constipation?: No  Constitutional Fever: No Night sweats?: No Weight loss?: No Fatigue?: No  Skin Skin rash/lesions?: No Itching?: No  Eyes Blurred vision?: No Double vision?: No  Ears/Nose/Throat Sore throat?: No Sinus problems?: No  Hematologic/Lymphatic Swollen glands?: No Easy bruising?: No  Cardiovascular Leg swelling?: No Chest pain?: No  Respiratory Cough?: No Shortness of breath?: No  Endocrine Excessive thirst?: No  Musculoskeletal Back pain?: No Joint pain?: No  Neurological Headaches?: No Dizziness?: No  Psychologic Depression?: No Anxiety?: No  Physical Exam: BP (!) 152/74   Pulse 80   Ht 5\' 10"  (1.778 m)   Wt 192 lb 12.8 oz (87.5 kg)   BMI 27.66 kg/m   Constitutional: Well nourished. Alert and oriented, No acute distress. HEENT: [place] AT, moist mucus membranes. Trachea midline, no masses. Cardiovascular: No clubbing, cyanosis, or edema. Respiratory: Normal respiratory effort, no increased work of breathing. GI: Abdomen is soft, non tender, non distended, no abdominal masses. Liver and spleen not palpable.  No hernias appreciated.  Stool sample for occult testing is not indicated.   GU: No CVA tenderness.  No bladder fullness or masses.  Patient with  circumcised phallus.  Urethral meatus is patent.  No penile discharge. No penile lesions or rashes. Scrotum without lesions, cysts, rashes and/or edema.  Testicles are located scrotally bilaterally. No masses are appreciated in the testicles. Left and right epididymis are normal. Rectal: Patient with  normal sphincter tone. Anus and perineum without scarring or rashes. No rectal masses are appreciated. Prostate is approximately 45 grams, no nodules are appreciated. Seminal vesicles are normal. Skin: No rashes, bruises or suspicious lesions. Lymph: No cervical or inguinal adenopathy. Neurologic: Grossly intact, no focal deficits, moving all 4 extremities. Psychiatric: Normal mood and affect.  Laboratory Data:  PSA of 0.7 on 07/23/2014.  Results for orders placed or performed during the hospital encounter of 12/21/16  CBC with Differential  Result Value Ref Range   WBC 12.7 (H) 3.8 - 10.6 K/uL   RBC 4.26 (L) 4.40 - 5.90 MIL/uL   Hemoglobin 12.7 (L) 13.0 - 18.0 g/dL   HCT 38.1 (L) 40.0 - 52.0 %   MCV 89.3 80.0 - 100.0 fL   MCH 29.9 26.0 - 34.0 pg   MCHC 33.5 32.0 - 36.0 g/dL   RDW 15.7 (H) 11.5 - 14.5 %   Platelets 102 (L) 150 - 440 K/uL   Neutrophils Relative % 54 %   Neutro Abs 6.8 (H) 1.4 - 6.5 K/uL   Lymphocytes Relative 40 %   Lymphs Abs 5.0 (H) 1.0 - 3.6 K/uL   Monocytes Relative 5 %   Monocytes Absolute 0.7 0.2 - 1.0 K/uL   Eosinophils Relative 1 %   Eosinophils Absolute 0.2 0 - 0.7 K/uL   Basophils Relative 0 %   Basophils Absolute 0.1 0 - 0.1 K/uL  Comprehensive metabolic panel  Result Value Ref Range   Sodium 138 135 - 145 mmol/L   Potassium 3.6 3.5 - 5.1 mmol/L  Chloride 105 101 - 111 mmol/L   CO2 26 22 - 32 mmol/L   Glucose, Bld 123 (H) 65 - 99 mg/dL   BUN 28 (H) 6 - 20 mg/dL   Creatinine, Ser 1.20 0.61 - 1.24 mg/dL   Calcium 8.7 (L) 8.9 - 10.3 mg/dL   Total Protein 6.1 (L) 6.5 - 8.1 g/dL   Albumin 3.8 3.5 - 5.0 g/dL   AST 19 15 - 41 U/L   ALT 16 (L) 17 - 63  U/L   Alkaline Phosphatase 56 38 - 126 U/L   Total Bilirubin 0.9 0.3 - 1.2 mg/dL   GFR calc non Af Amer 56 (L) >60 mL/min   GFR calc Af Amer >60 >60 mL/min   Anion gap 7 5 - 15  Troponin I  Result Value Ref Range   Troponin I <0.03 <0.03 ng/mL  Urinalysis, Complete w Microscopic  Result Value Ref Range   Color, Urine YELLOW YELLOW   APPearance CLEAR CLEAR   Specific Gravity, Urine 1.020 1.005 - 1.030   pH 5.0 5.0 - 8.0   Glucose, UA NEGATIVE NEGATIVE mg/dL   Hgb urine dipstick NEGATIVE NEGATIVE   Bilirubin Urine NEGATIVE NEGATIVE   Ketones, ur TRACE (A) NEGATIVE mg/dL   Protein, ur NEGATIVE NEGATIVE mg/dL   Nitrite NEGATIVE NEGATIVE   Leukocytes, UA NEGATIVE NEGATIVE   Squamous Epithelial / LPF NONE SEEN NONE SEEN   WBC, UA 0-5 0 - 5 WBC/hpf   RBC / HPF 0-5 0 - 5 RBC/hpf   Bacteria, UA NONE SEEN NONE SEEN   Mucous PRESENT    Hyaline Casts, UA PRESENT    Lab Results  Component Value Date   WBC 12.7 (H) 12/21/2016   HGB 12.7 (L) 12/21/2016   HCT 38.1 (L) 12/21/2016   MCV 89.3 12/21/2016   PLT 102 (L) 12/21/2016    Lab Results  Component Value Date   CREATININE 1.20 12/21/2016     Lab Results  Component Value Date   HGBA1C 5.6 08/24/2015   I have reviewed the labs   Assessment & Plan:    1. BPH (benign prostatic hyperplasia) with LUTS:   IPSS 5/2.  He will continue the finasteride.  He will follow-up in one year's time for IPSS score and PVR.  2. History of nephrolithiasis:   Right lower ureteral stone passed spontaneously on 12/2015.   Return in about 1 year (around 01/23/2018) for I PSS and exam.  Zara Council, [facility] [address] [place], [place] [address] [phone]

## 2017-01-23 ENCOUNTER — Encounter: Payer: Self-pay | Admitting: Urology

## 2017-01-23 ENCOUNTER — Ambulatory Visit (INDEPENDENT_AMBULATORY_CARE_PROVIDER_SITE_OTHER): Payer: PPO | Admitting: Urology

## 2017-01-23 VITALS — BP 152/74 | HR 80 | Ht 70.0 in | Wt 192.8 lb

## 2017-01-23 DIAGNOSIS — N401 Enlarged prostate with lower urinary tract symptoms: Secondary | ICD-10-CM

## 2017-01-23 DIAGNOSIS — Z87442 Personal history of urinary calculi: Secondary | ICD-10-CM | POA: Diagnosis not present

## 2017-01-23 DIAGNOSIS — N138 Other obstructive and reflux uropathy: Secondary | ICD-10-CM | POA: Diagnosis not present

## 2017-01-27 DIAGNOSIS — I350 Nonrheumatic aortic (valve) stenosis: Secondary | ICD-10-CM | POA: Diagnosis not present

## 2017-01-27 DIAGNOSIS — E782 Mixed hyperlipidemia: Secondary | ICD-10-CM | POA: Diagnosis not present

## 2017-01-27 DIAGNOSIS — I739 Peripheral vascular disease, unspecified: Secondary | ICD-10-CM | POA: Diagnosis not present

## 2017-01-27 DIAGNOSIS — I10 Essential (primary) hypertension: Secondary | ICD-10-CM | POA: Diagnosis not present

## 2017-02-01 DIAGNOSIS — H548 Legal blindness, as defined in USA: Secondary | ICD-10-CM | POA: Diagnosis not present

## 2017-02-01 DIAGNOSIS — Z8673 Personal history of transient ischemic attack (TIA), and cerebral infarction without residual deficits: Secondary | ICD-10-CM | POA: Diagnosis not present

## 2017-02-01 DIAGNOSIS — R1312 Dysphagia, oropharyngeal phase: Secondary | ICD-10-CM | POA: Diagnosis not present

## 2017-02-01 DIAGNOSIS — Z7982 Long term (current) use of aspirin: Secondary | ICD-10-CM | POA: Diagnosis not present

## 2017-02-01 DIAGNOSIS — R2689 Other abnormalities of gait and mobility: Secondary | ICD-10-CM | POA: Diagnosis not present

## 2017-02-01 DIAGNOSIS — Z9181 History of falling: Secondary | ICD-10-CM | POA: Diagnosis not present

## 2017-02-01 DIAGNOSIS — M6281 Muscle weakness (generalized): Secondary | ICD-10-CM | POA: Diagnosis not present

## 2017-02-01 DIAGNOSIS — Z7902 Long term (current) use of antithrombotics/antiplatelets: Secondary | ICD-10-CM | POA: Diagnosis not present

## 2017-02-01 DIAGNOSIS — I10 Essential (primary) hypertension: Secondary | ICD-10-CM | POA: Diagnosis not present

## 2017-02-01 DIAGNOSIS — Z7984 Long term (current) use of oral hypoglycemic drugs: Secondary | ICD-10-CM | POA: Diagnosis not present

## 2017-02-01 DIAGNOSIS — R634 Abnormal weight loss: Secondary | ICD-10-CM | POA: Diagnosis not present

## 2017-02-01 DIAGNOSIS — E1142 Type 2 diabetes mellitus with diabetic polyneuropathy: Secondary | ICD-10-CM | POA: Diagnosis not present

## 2017-02-01 DIAGNOSIS — I739 Peripheral vascular disease, unspecified: Secondary | ICD-10-CM | POA: Diagnosis not present

## 2017-02-13 DIAGNOSIS — E1142 Type 2 diabetes mellitus with diabetic polyneuropathy: Secondary | ICD-10-CM | POA: Diagnosis not present

## 2017-02-13 DIAGNOSIS — I10 Essential (primary) hypertension: Secondary | ICD-10-CM | POA: Diagnosis not present

## 2017-02-13 DIAGNOSIS — R2689 Other abnormalities of gait and mobility: Secondary | ICD-10-CM | POA: Diagnosis not present

## 2017-02-13 DIAGNOSIS — R634 Abnormal weight loss: Secondary | ICD-10-CM | POA: Diagnosis not present

## 2017-02-13 DIAGNOSIS — I739 Peripheral vascular disease, unspecified: Secondary | ICD-10-CM | POA: Diagnosis not present

## 2017-02-26 IMAGING — DX DG CHEST 1V PORT
1 series · 1 of 1 positions shown · non-contrast
Comparison: Chest radiograph performed 08/24/2015

CLINICAL DATA: Acute onset of generalized chest pain and shortness
of breath. Initial encounter.

EXAM:
PORTABLE CHEST 1 VIEW

[chest ap]
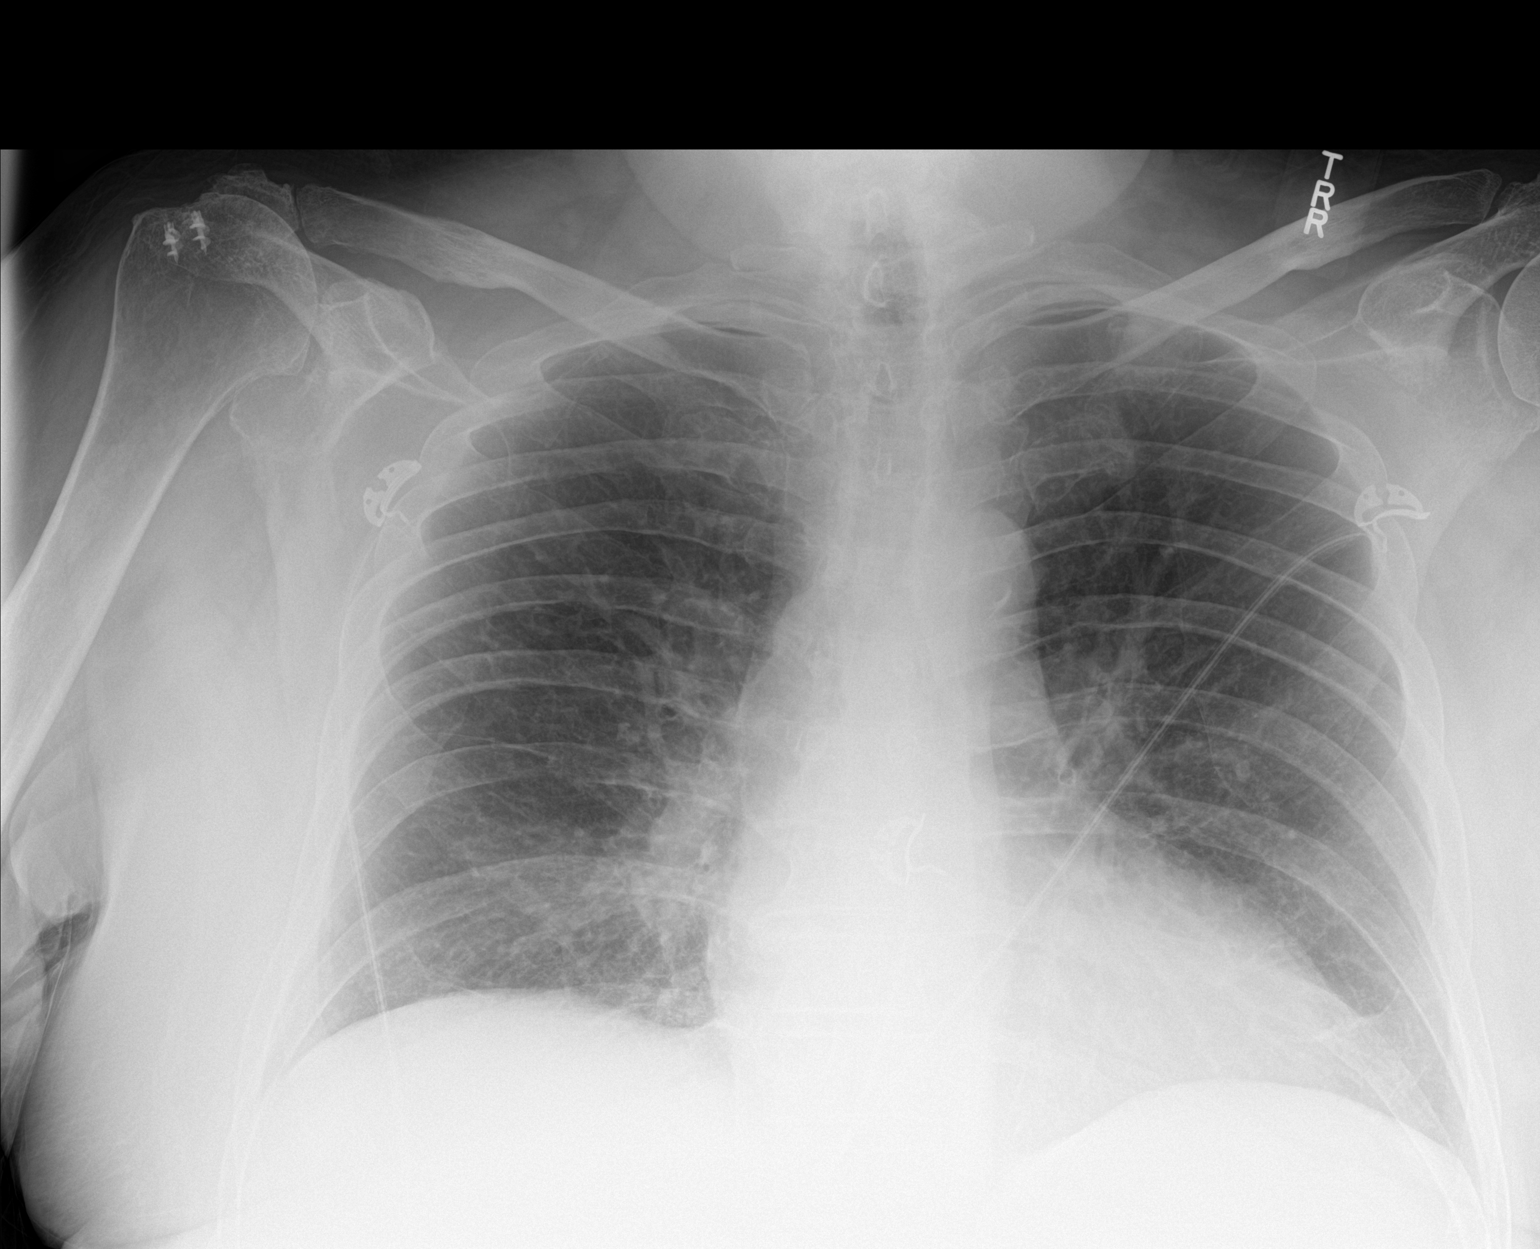

[1 of 1 positions shown; findings below may reference images not displayed]

FINDINGS: The lungs are well-aerated. Mild bibasilar opacities may reflect
mild pneumonia. There is no evidence of pleural effusion or
pneumothorax.

The cardiomediastinal silhouette is borderline normal in size. No
acute osseous abnormalities are seen.
IMPRESSION: Mild bibasilar opacities may reflect mild pneumonia.

## 2017-03-01 DIAGNOSIS — Z79899 Other long term (current) drug therapy: Secondary | ICD-10-CM | POA: Diagnosis not present

## 2017-03-01 DIAGNOSIS — E782 Mixed hyperlipidemia: Secondary | ICD-10-CM | POA: Diagnosis not present

## 2017-03-01 DIAGNOSIS — I10 Essential (primary) hypertension: Secondary | ICD-10-CM | POA: Diagnosis not present

## 2017-03-01 DIAGNOSIS — Z0000 Encounter for general adult medical examination without abnormal findings: Secondary | ICD-10-CM | POA: Diagnosis not present

## 2017-03-01 DIAGNOSIS — F5104 Psychophysiologic insomnia: Secondary | ICD-10-CM | POA: Diagnosis not present

## 2017-03-01 DIAGNOSIS — E039 Hypothyroidism, unspecified: Secondary | ICD-10-CM | POA: Diagnosis not present

## 2017-03-01 DIAGNOSIS — E1142 Type 2 diabetes mellitus with diabetic polyneuropathy: Secondary | ICD-10-CM | POA: Diagnosis not present

## 2017-03-16 DIAGNOSIS — H401133 Primary open-angle glaucoma, bilateral, severe stage: Secondary | ICD-10-CM | POA: Diagnosis not present

## 2017-03-23 DIAGNOSIS — I509 Heart failure, unspecified: Secondary | ICD-10-CM | POA: Diagnosis not present

## 2017-03-23 DIAGNOSIS — R0602 Shortness of breath: Secondary | ICD-10-CM | POA: Diagnosis not present

## 2017-03-23 DIAGNOSIS — I10 Essential (primary) hypertension: Secondary | ICD-10-CM | POA: Diagnosis not present

## 2017-03-23 DIAGNOSIS — E1142 Type 2 diabetes mellitus with diabetic polyneuropathy: Secondary | ICD-10-CM | POA: Diagnosis not present

## 2017-03-23 DIAGNOSIS — I350 Nonrheumatic aortic (valve) stenosis: Secondary | ICD-10-CM | POA: Diagnosis not present

## 2017-03-23 DIAGNOSIS — H401133 Primary open-angle glaucoma, bilateral, severe stage: Secondary | ICD-10-CM | POA: Diagnosis not present

## 2017-03-24 DIAGNOSIS — Z23 Encounter for immunization: Secondary | ICD-10-CM | POA: Diagnosis not present

## 2017-04-06 DIAGNOSIS — I10 Essential (primary) hypertension: Secondary | ICD-10-CM | POA: Diagnosis not present

## 2017-04-06 DIAGNOSIS — I5041 Acute combined systolic (congestive) and diastolic (congestive) heart failure: Secondary | ICD-10-CM | POA: Diagnosis not present

## 2017-04-06 DIAGNOSIS — I509 Heart failure, unspecified: Secondary | ICD-10-CM | POA: Diagnosis not present

## 2017-05-05 ENCOUNTER — Other Ambulatory Visit: Payer: Self-pay

## 2017-05-05 DIAGNOSIS — N138 Other obstructive and reflux uropathy: Secondary | ICD-10-CM

## 2017-05-05 DIAGNOSIS — N401 Enlarged prostate with lower urinary tract symptoms: Principal | ICD-10-CM

## 2017-05-05 MED ORDER — FINASTERIDE 5 MG PO TABS: 5.0000 mg | ORAL_TABLET | Freq: Every day | ORAL | 12 refills | Status: DC

## 2017-06-01 DIAGNOSIS — I10 Essential (primary) hypertension: Secondary | ICD-10-CM | POA: Diagnosis not present

## 2017-06-01 DIAGNOSIS — E782 Mixed hyperlipidemia: Secondary | ICD-10-CM | POA: Diagnosis not present

## 2017-06-01 DIAGNOSIS — I350 Nonrheumatic aortic (valve) stenosis: Secondary | ICD-10-CM | POA: Diagnosis not present

## 2017-06-01 DIAGNOSIS — F5104 Psychophysiologic insomnia: Secondary | ICD-10-CM | POA: Diagnosis not present

## 2017-06-01 DIAGNOSIS — Z79899 Other long term (current) drug therapy: Secondary | ICD-10-CM | POA: Diagnosis not present

## 2017-06-01 DIAGNOSIS — E1142 Type 2 diabetes mellitus with diabetic polyneuropathy: Secondary | ICD-10-CM | POA: Diagnosis not present

## 2017-06-01 DIAGNOSIS — E039 Hypothyroidism, unspecified: Secondary | ICD-10-CM | POA: Diagnosis not present

## 2017-06-01 DIAGNOSIS — I739 Peripheral vascular disease, unspecified: Secondary | ICD-10-CM | POA: Diagnosis not present

## 2017-06-01 DIAGNOSIS — G63 Polyneuropathy in diseases classified elsewhere: Secondary | ICD-10-CM | POA: Diagnosis not present

## 2017-07-24 DIAGNOSIS — H401133 Primary open-angle glaucoma, bilateral, severe stage: Secondary | ICD-10-CM | POA: Diagnosis not present

## 2017-08-01 DIAGNOSIS — I739 Peripheral vascular disease, unspecified: Secondary | ICD-10-CM | POA: Diagnosis not present

## 2017-08-01 DIAGNOSIS — I10 Essential (primary) hypertension: Secondary | ICD-10-CM | POA: Diagnosis not present

## 2017-08-01 DIAGNOSIS — I350 Nonrheumatic aortic (valve) stenosis: Secondary | ICD-10-CM | POA: Diagnosis not present

## 2017-08-01 DIAGNOSIS — E782 Mixed hyperlipidemia: Secondary | ICD-10-CM | POA: Diagnosis not present

## 2017-08-18 DIAGNOSIS — I350 Nonrheumatic aortic (valve) stenosis: Secondary | ICD-10-CM | POA: Diagnosis not present

## 2017-08-30 DIAGNOSIS — I739 Peripheral vascular disease, unspecified: Secondary | ICD-10-CM | POA: Diagnosis not present

## 2017-08-30 DIAGNOSIS — I350 Nonrheumatic aortic (valve) stenosis: Secondary | ICD-10-CM | POA: Diagnosis not present

## 2017-08-30 DIAGNOSIS — I10 Essential (primary) hypertension: Secondary | ICD-10-CM | POA: Diagnosis not present

## 2017-08-30 DIAGNOSIS — E782 Mixed hyperlipidemia: Secondary | ICD-10-CM | POA: Diagnosis not present

## 2017-08-31 DIAGNOSIS — I350 Nonrheumatic aortic (valve) stenosis: Secondary | ICD-10-CM | POA: Diagnosis not present

## 2017-08-31 DIAGNOSIS — Z125 Encounter for screening for malignant neoplasm of prostate: Secondary | ICD-10-CM | POA: Diagnosis not present

## 2017-08-31 DIAGNOSIS — I10 Essential (primary) hypertension: Secondary | ICD-10-CM | POA: Diagnosis not present

## 2017-08-31 DIAGNOSIS — E039 Hypothyroidism, unspecified: Secondary | ICD-10-CM | POA: Diagnosis not present

## 2017-08-31 DIAGNOSIS — E1142 Type 2 diabetes mellitus with diabetic polyneuropathy: Secondary | ICD-10-CM | POA: Diagnosis not present

## 2017-08-31 DIAGNOSIS — Z0000 Encounter for general adult medical examination without abnormal findings: Secondary | ICD-10-CM | POA: Diagnosis not present

## 2017-08-31 DIAGNOSIS — E782 Mixed hyperlipidemia: Secondary | ICD-10-CM | POA: Diagnosis not present

## 2017-08-31 DIAGNOSIS — Z79899 Other long term (current) drug therapy: Secondary | ICD-10-CM | POA: Diagnosis not present

## 2017-09-25 DIAGNOSIS — I10 Essential (primary) hypertension: Secondary | ICD-10-CM | POA: Diagnosis not present

## 2017-09-25 DIAGNOSIS — I350 Nonrheumatic aortic (valve) stenosis: Secondary | ICD-10-CM | POA: Diagnosis not present

## 2017-09-25 DIAGNOSIS — E109 Type 1 diabetes mellitus without complications: Secondary | ICD-10-CM | POA: Diagnosis not present

## 2017-09-25 DIAGNOSIS — E782 Mixed hyperlipidemia: Secondary | ICD-10-CM | POA: Diagnosis not present

## 2017-09-25 DIAGNOSIS — I739 Peripheral vascular disease, unspecified: Secondary | ICD-10-CM | POA: Diagnosis not present

## 2017-10-02 ENCOUNTER — Institutional Professional Consult (permissible substitution): Payer: PPO | Admitting: Cardiovascular Disease

## 2017-10-16 ENCOUNTER — Encounter: Payer: Self-pay | Admitting: Cardiovascular Disease

## 2017-10-16 ENCOUNTER — Ambulatory Visit (INDEPENDENT_AMBULATORY_CARE_PROVIDER_SITE_OTHER): Payer: PPO | Admitting: Cardiovascular Disease

## 2017-10-16 VITALS — BP 132/64 | HR 66 | Ht 70.0 in | Wt 204.0 lb

## 2017-10-16 DIAGNOSIS — I350 Nonrheumatic aortic (valve) stenosis: Secondary | ICD-10-CM

## 2017-10-16 NOTE — H&P (View-Only) (Signed)
Cardiology Office Note Date:  10/17/2017   ID:  Chad Rollins, DOB 1935-12-26, MRN [id number]  PCP:  Idelle Crouch, MD  Cardiologist:  Jordan Hawks, MD  Chief Complaint  Patient presents with  . TAVR Consult     History of Present Illness: Chad Rollins is a 82 y.o. male who presents for evaluation of severe aortic stenosis, referred by Dr Ubaldo Glassing.   He is here with his son and daughter today. He is a widower since approximately 6 years ago. The patient lives alone. His son lives nearby. He has someone come in and help him with tasks around the house. He hasn't driven in 4 years because of poor eyesight. He no longer works in the yard or even walks to [employer] anymore because he's concerned about falling with his poor vision. He gets around well in the house. He uses a cane or walker in the house. He denies shortness of breath, chest pain, orthopnea, PND, leg swelling, or lightheadedness at his current level of activity. He does have occasional 'vertigo.'  The patient has a history of TIA many years ago, but has no residual deficit. He primarily had tingling around one side of his mouth with facial droop and speech deficit. 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 His children have noticed that his speech has been more deliberate since his stroke.   He's had a heart murmur for many years and has been followed for aortic stenosis with serial exams and echo studies. He recently had a CXR demonstrating interstitial pulmonary edema and there was concern that aortic stenosis is now precipitating diastolic heart failure. He was referred back to Dr Ubaldo Glassing who performed an echo demonstrating worsening aortic stenosis with a mean transaortic gradient of 52 mmHg.  Past Medical History:  Diagnosis Date  . Allergic rhinitis   . Aortic stenosis   . BPH with obstruction/lower urinary tract symptoms   . Collagen vascular disease (HCC)    Rheumatoid Arthritis  .  Continuous right lower quadrant pain   . Diabetes mellitus ([place])   . HLD (hyperlipidemia)   . HTN (hypertension)   . Hypothyroidism   . Injury of right rotator cuff   . Obesity   . Osteoarthritis   . Peripheral neuropathy   . Rheumatoid arthritis ([place])   . Stroke [facility])     Past Surgical History:  Procedure Laterality Date  . CHOLECYSTECTOMY    . Lipoma excisions    . Surgery for sleep apnea    . TONSILLECTOMY      Current Outpatient Medications  Medication Sig Dispense Refill  . aspirin EC 81 MG tablet Take 81 mg by mouth daily.    Marland Kitchen atorvastatin (LIPITOR) 20 MG tablet Take 20 mg by mouth at bedtime.     . brimonidine (ALPHAGAN) 0.2 % ophthalmic solution Place 1 drop into the left eye 2 (two) times daily.    . busPIRone (BUSPAR) 15 MG tablet Take 15 mg by mouth 3 (three) times daily.     . carboxymethylcellulose (REFRESH PLUS) 0.5 % SOLN Apply to eye.    . Cholecalciferol (VITAMIN D3) 2000 units capsule Take by mouth.    . cloNIDine (CATAPRES) 0.2 MG tablet Take 1 tablet (0.2 mg total) by mouth 2 (two) times daily. 60 tablet 11  . clopidogrel (PLAVIX) 75 MG tablet Take 75 mg by mouth at bedtime.     . dorzolamide-timolol (COSOPT) 22.3-6.8 MG/ML ophthalmic solution Place  1 drop into both eyes 2 (two) times daily.    . finasteride (PROSCAR) 5 MG tablet Take 1 tablet (5 mg total) by mouth daily. 30 tablet 12  . furosemide (LASIX) 20 MG tablet Take 20 mg by mouth daily.    Marland Kitchen glipiZIDE (GLUCOTROL) 5 MG tablet Take 5 mg by mouth 2 (two) times daily before a meal.     . latanoprost (XALATAN) 0.005 % ophthalmic solution Place 1 drop into both eyes at bedtime.     Marland Kitchen levothyroxine (SYNTHROID, LEVOTHROID) 75 MCG tablet Take 75 mcg by mouth daily before breakfast.    . losartan (COZAAR) 50 MG tablet Take 50 mg by mouth 2 (two) times daily.     . Melatonin 5 MG TABS Take 5 mg by mouth at bedtime.     . metFORMIN (GLUCOPHAGE) 1000 MG tablet Take 1,000 mg by mouth 2 (two) times daily with a  meal.    . metoprolol tartrate (LOPRESSOR) 25 MG tablet Take 25 mg by mouth 2 (two) times daily.     . Multiple Vitamins-Minerals (PRESERVISION AREDS 2 PO) Take by mouth.    . Multiple Vitamins-Minerals (PRESERVISION AREDS 2) CAPS Take 1 capsule by mouth 2 (two) times daily.    Marland Kitchen nystatin (MYCOSTATIN) powder Apply topically 2 (two) times daily as needed. For rash under arms    . pantoprazole (PROTONIX) 40 MG tablet Take 1 tablet (40 mg total) by mouth daily. 30 tablet 2  . tamsulosin (FLOMAX) 0.4 MG CAPS capsule Take 1 capsule (0.4 mg total) by mouth daily after supper. 30 capsule 12  . vitamin B-12 (CYANOCOBALAMIN) 1000 MCG tablet Take 1,000 mcg by mouth at bedtime.     No current facility-administered medications for this visit.     Allergies:   Acetaminophen   Social History:  The patient  reports that he quit smoking about 44 years ago. He has quit using smokeless tobacco. He reports that he does not drink alcohol or use drugs.   Family History:  The patient's family history includes Diabetes in his mother; Hypertension in his mother.    ROS:  Please see the history of present illness.    All other systems are reviewed and negative.    PHYSICAL EXAM: VS:  BP 132/64   Pulse 66   Ht 5\' 10"  (1.778 m)   Wt 204 lb (92.5 kg)   SpO2 98%   BMI 29.27 kg/m  , BMI Body mass index is 29.27 kg/m. GEN: Well nourished, well developed, pleasant elderly malein no acute distress  HEENT: normal, deliberate speech Neck: no JVD, no masses. Bilateral carotid bruits Cardiac: RRR with 3/6 harsh systolic murmur at the RUSB, no diastolic murmur, absent A2          Respiratory:  clear to auscultation bilaterally, normal work of breathing GI: soft, nontender, nondistended, + BS MS: no deformity or atrophy  Ext: no pretibial edema, pedal pulses 2+= bilaterally Skin: warm and dry, no rash Neuro:  Strength and sensation are intact Psych: euthymic mood, full affect  EKG:  EKG is not ordered  today.  Recent Labs: 12/21/2016: ALT 16; BUN 28; Creatinine, Ser 1.20; Hemoglobin 12.7; Platelets 102; Potassium 3.6; Sodium 138   Lipid Panel     Component Value Date/Time   CHOL 99 09/18/2015 0444   CHOL 148 08/08/2014 0522   TRIG 128 09/18/2015 0444   TRIG 195 08/08/2014 0522   HDL 28 (L) 09/18/2015 0444   HDL 28 (L) 08/08/2014 0522  CHOLHDL 3.5 09/18/2015 0444   VLDL 26 09/18/2015 0444   VLDL 39 08/08/2014 0522   LDLCALC 45 09/18/2015 0444   LDLCALC 81 08/08/2014 0522      Wt Readings from Last 3 Encounters:  10/16/17 204 lb (92.5 kg)  01/23/17 192 lb 12.8 oz (87.5 kg)  12/21/16 198 lb 6.6 oz (90 kg)     Cardiac Studies Reviewed: 2D Echo: ECHOCARDIOGRAPHIC MEASUREMENTS 2D DIMENSIONS AORTAValues Normal Range MAIN PA ValuesNormal Range Annulus: nm*[2.3-2.9] PA Main: nm* [1.5-2.1] Aorta Sin: 3.0 cm [3.1-3.7]RIGHT VENTRICLE ST Junction: nm*[2.6-3.2] RV Base: nm* [<4.2] Asc.Aorta: nm*[2.6-3.4]RV Mid: nm* [<3.5] LEFT VENTRICLERV Length: nm* [<8.6] LVIDd: 5.3 cm [4.2-5.9]INFERIOR VENA CAVA LVIDs: 3.9 cmMax. IVC: nm* [<=2.1]  FS: 25.9 % [>25]Min. IVC: nm* SWT: 1.3 cm [0.6-1.0]------------------ PWT: 1.3 cm [0.6-1.0]nm* - not measured LEFT ATRIUM LA Diam: 4.0 cm [3.0-4.0] LA A4C Area: nm*[<20] LA Volume: ZO*[10-96] _________________________________________________________________________________________ ECHOCARDIOGRAPHIC DESCRIPTIONS AORTIC ROOT  Size: Normal  Dissection: No dissection AORTIC VALVE   Leaflets: Tricuspid Morphology: MODERATELY THICKENED  Mobility: PARTIALLY MOBILE LEFT VENTRICLE  Size: NormalAnterior: Normal Contraction: Normal Lateral: Normal  Closest EF: >55% (Estimated)Septal: Normal LV Masses: No Masses Apical: Normal LVH: MILD LVHInferior: Normal Posterior: Normal  Dias.FxClass: (Grade 2) relaxation abnormal, pseudonormal MITRAL VALVE  Leaflets: NormalMobility: Fully mobile  Morphology: Normal LEFT ATRIUM  Size: Normal LA Masses: No masses IA Septum: Normal IAS MAIN PA  Size: Normal PULMONIC VALVE  Morphology: NormalMobility: Fully mobile RIGHT VENTRICLE RV Masses: No Masses Size: Normal Free Wall: Normal Contraction: Normal TRICUSPID VALVE  Leaflets: NormalMobility: Fully mobile  Morphology: Normal RIGHT ATRIUM  Size: NormalRA Other: None RA Mass: No masses PERICARDIUM Fluid: No effusion INFERIOR VENACAVA  Size: Normal Normal respiratory collapse _________________________________________________________________________________________  DOPPLER ECHO and OTHER SPECIAL PROCEDURES  Aortic: MILD ARSEVERE AS  483.7 cm/sec peak vel93.6 mmHg peak grad  51.8 mmHg mean grad0.43 cm^2 by DOPPLER  Mitral: MILD MRNo MS   MV Inflow E Vel = 106.0 cm/secMV Annulus E'Vel = 5.2 cm/sec  E/E'Ratio = 20.4 Tricuspid: TRIVIAL TR No TS  148.3 cm/sec peak TR vel 13.8 mmHg peak RV pressure Pulmonary: TRIVIAL PR No PS _________________________________________________________________________________________ INTERPRETATION NORMAL LEFT VENTRICULAR SYSTOLIC FUNCTION WITH MILD LVH NORMAL RIGHT VENTRICULAR SYSTOLIC FUNCTION MILD VALVULAR REGURGITATION (See above) SEVERE VALVULAR STENOSIS (See above) Morphology: MODERATELY THICKENED Mobility: PARTIALLY MOBILE Closest EF: >55% (Estimated) LVH: MILD LVH AVS: SEVERE AS Aortic: MILD AR Mitral: MILD MR Tricuspid: TRIVIAL TR  STS Risk Calculator: Risk of Mortality: 4.762% Renal Failure: 4.223% Permanent Stroke: 3.998% Prolonged Ventilation: 14.350% DSW Infection: 0.106% Reoperation: 7.263% Morbidity or Mortality: 22.253% Short Length of Stay: 25.712% Long Length of Stay: 8.359%  ASSESSMENT AND PLAN: 82 yo male with severe, at least stage C1, aortic stenosis. Echo images are personally reviewed and demonstrate severe calcification of all 3 aortic valve leaflets with severely restricted leaflet mobility. Doppler data shows a peak transaortic velocity approaching 5 meters/second and mean gradient > 50 mmHg. I'm certain the patient would be symptomatic if he were not so sedentary. I agree that interstitial edema is likely a sign of early heart failure related to this patient's aortic stenosis. I have reviewed the natural history of aortic stenosis with the patient and their family members who are present today. We have discussed the limitations of medical therapy and the poor prognosis associated with symptomatic aortic stenosis. We have reviewed potential treatment options, including palliative medical therapy, conventional surgical aortic valve replacement, and  transcatheter aortic valve replacement. We discussed treatment options in  the context of the patient's specific comorbid medical conditions.   Considering his progressive hemodynamic changes of aortic stenosis by serial echo studies and CXR findings of interstitial edema suggestive of CHF warrant consideration of aortic valve replacement. With comorbid conditions of advanced age, marked physical impairment related to vision loss, and history of stroke, TAVR would be a reasonable treatment option for this patient pending multidisciplinary review of his case. We discussed further evaluation for TAVR, which would include R/L heart cath, CTA studies, and formal cardiac surgical evaluation.   I have reviewed the risks, indications, and alternatives to cardiac catheterization, possible angioplasty, and stenting with the patient. Risks include but are not limited to bleeding, infection, vascular injury, stroke, myocardial infection, arrhythmia, kidney injury, radiation-related injury in the case of prolonged fluoroscopy use, emergency cardiac surgery, and death. The patient understands the risks of serious complication is 1-2 in 9179 with diagnostic cardiac cath and 1-2% or less with angioplasty/stenting.   Current medicines are reviewed with the patient today.  The patient does not have concerns regarding medicines.  Labs/ tests ordered today include:   Orders Placed This Encounter  Procedures  . CT ANGIO ABDOMEN PELVIS  W &/OR WO CONTRAST  . CT CORONARY MORPH W/CTA COR W/SCORE W/CA W/CM &/OR WO/CM  . CT ANGIO CHEST AORTA W &/OR WO CONTRAST  . Basic metabolic panel  . CBC with Differential/Platelet  . INR/PT  . Pulmonary Function Test    Disposition:   TAVR workup as above, multidisciplinary team evaluation after studies completed  Signed, Sherren Mocha, MD  10/17/2017 11:40 PM    [address], [place], [place]  [address] Phone: [phone]; Fax: [phone]

## 2017-10-16 NOTE — Progress Notes (Signed)
Cardiology Office Note Date:  10/17/2017   ID:  Chad Rollins, DOB May 18, 1936, MRN [id number]  PCP:  Idelle Crouch, MD  Cardiologist:  Jordan Hawks, MD  Chief Complaint  Patient presents with  . TAVR Consult     History of Present Illness: Chad Rollins is a 82 y.o. male who presents for evaluation of severe aortic stenosis, referred by Dr Ubaldo Glassing.   He is here with his son and daughter today. He is a widower since approximately 6 years ago. The patient lives alone. His son lives nearby. He has someone come in and help him with tasks around the house. He hasn't driven in 4 years because of poor eyesight. He no longer works in the yard or even walks to [employer] anymore because he's concerned about falling with his poor vision. He gets around well in the house. He uses a cane or walker in the house. He denies shortness of breath, chest pain, orthopnea, PND, leg swelling, or lightheadedness at his current level of activity. He does have occasional 'vertigo.'  The patient has a history of TIA many years ago, but has no residual deficit. He primarily had tingling around one side of his mouth with facial droop and speech deficit. 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 His children have noticed that his speech has been more deliberate since his stroke.   He's had a heart murmur for many years and has been followed for aortic stenosis with serial exams and echo studies. He recently had a CXR demonstrating interstitial pulmonary edema and there was concern that aortic stenosis is now precipitating diastolic heart failure. He was referred back to Dr Ubaldo Glassing who performed an echo demonstrating worsening aortic stenosis with a mean transaortic gradient of 52 mmHg.  Past Medical History:  Diagnosis Date  . Allergic rhinitis   . Aortic stenosis   . BPH with obstruction/lower urinary tract symptoms   . Collagen vascular disease (HCC)    Rheumatoid Arthritis  .  Continuous right lower quadrant pain   . Diabetes mellitus ([place])   . HLD (hyperlipidemia)   . HTN (hypertension)   . Hypothyroidism   . Injury of right rotator cuff   . Obesity   . Osteoarthritis   . Peripheral neuropathy   . Rheumatoid arthritis ([place])   . Stroke [facility])     Past Surgical History:  Procedure Laterality Date  . CHOLECYSTECTOMY    . Lipoma excisions    . Surgery for sleep apnea    . TONSILLECTOMY      Current Outpatient Medications  Medication Sig Dispense Refill  . aspirin EC 81 MG tablet Take 81 mg by mouth daily.    Marland Kitchen atorvastatin (LIPITOR) 20 MG tablet Take 20 mg by mouth at bedtime.     . brimonidine (ALPHAGAN) 0.2 % ophthalmic solution Place 1 drop into the left eye 2 (two) times daily.    . busPIRone (BUSPAR) 15 MG tablet Take 15 mg by mouth 3 (three) times daily.     . carboxymethylcellulose (REFRESH PLUS) 0.5 % SOLN Apply to eye.    . Cholecalciferol (VITAMIN D3) 2000 units capsule Take by mouth.    . cloNIDine (CATAPRES) 0.2 MG tablet Take 1 tablet (0.2 mg total) by mouth 2 (two) times daily. 60 tablet 11  . clopidogrel (PLAVIX) 75 MG tablet Take 75 mg by mouth at bedtime.     . dorzolamide-timolol (COSOPT) 22.3-6.8 MG/ML ophthalmic solution Place  1 drop into both eyes 2 (two) times daily.    . finasteride (PROSCAR) 5 MG tablet Take 1 tablet (5 mg total) by mouth daily. 30 tablet 12  . furosemide (LASIX) 20 MG tablet Take 20 mg by mouth daily.    Marland Kitchen glipiZIDE (GLUCOTROL) 5 MG tablet Take 5 mg by mouth 2 (two) times daily before a meal.     . latanoprost (XALATAN) 0.005 % ophthalmic solution Place 1 drop into both eyes at bedtime.     Marland Kitchen levothyroxine (SYNTHROID, LEVOTHROID) 75 MCG tablet Take 75 mcg by mouth daily before breakfast.    . losartan (COZAAR) 50 MG tablet Take 50 mg by mouth 2 (two) times daily.     . Melatonin 5 MG TABS Take 5 mg by mouth at bedtime.     . metFORMIN (GLUCOPHAGE) 1000 MG tablet Take 1,000 mg by mouth 2 (two) times daily with a  meal.    . metoprolol tartrate (LOPRESSOR) 25 MG tablet Take 25 mg by mouth 2 (two) times daily.     . Multiple Vitamins-Minerals (PRESERVISION AREDS 2 PO) Take by mouth.    . Multiple Vitamins-Minerals (PRESERVISION AREDS 2) CAPS Take 1 capsule by mouth 2 (two) times daily.    Marland Kitchen nystatin (MYCOSTATIN) powder Apply topically 2 (two) times daily as needed. For rash under arms    . pantoprazole (PROTONIX) 40 MG tablet Take 1 tablet (40 mg total) by mouth daily. 30 tablet 2  . tamsulosin (FLOMAX) 0.4 MG CAPS capsule Take 1 capsule (0.4 mg total) by mouth daily after supper. 30 capsule 12  . vitamin B-12 (CYANOCOBALAMIN) 1000 MCG tablet Take 1,000 mcg by mouth at bedtime.     No current facility-administered medications for this visit.     Allergies:   Acetaminophen   Social History:  The patient  reports that he quit smoking about 44 years ago. He has quit using smokeless tobacco. He reports that he does not drink alcohol or use drugs.   Family History:  The patient's family history includes Diabetes in his mother; Hypertension in his mother.    ROS:  Please see the history of present illness.    All other systems are reviewed and negative.    PHYSICAL EXAM: VS:  BP 132/64   Pulse 66   Ht 5\' 10"  (1.778 m)   Wt 204 lb (92.5 kg)   SpO2 98%   BMI 29.27 kg/m  , BMI Body mass index is 29.27 kg/m. GEN: Well nourished, well developed, pleasant elderly malein no acute distress  HEENT: normal, deliberate speech Neck: no JVD, no masses. Bilateral carotid bruits Cardiac: RRR with 3/6 harsh systolic murmur at the RUSB, no diastolic murmur, absent A2          Respiratory:  clear to auscultation bilaterally, normal work of breathing GI: soft, nontender, nondistended, + BS MS: no deformity or atrophy  Ext: no pretibial edema, pedal pulses 2+= bilaterally Skin: warm and dry, no rash Neuro:  Strength and sensation are intact Psych: euthymic mood, full affect  EKG:  EKG is not ordered  today.  Recent Labs: 12/21/2016: ALT 16; BUN 28; Creatinine, Ser 1.20; Hemoglobin 12.7; Platelets 102; Potassium 3.6; Sodium 138   Lipid Panel     Component Value Date/Time   CHOL 99 09/18/2015 0444   CHOL 148 08/08/2014 0522   TRIG 128 09/18/2015 0444   TRIG 195 08/08/2014 0522   HDL 28 (L) 09/18/2015 0444   HDL 28 (L) 08/08/2014 0522  CHOLHDL 3.5 09/18/2015 0444   VLDL 26 09/18/2015 0444   VLDL 39 08/08/2014 0522   LDLCALC 45 09/18/2015 0444   LDLCALC 81 08/08/2014 0522      Wt Readings from Last 3 Encounters:  10/16/17 204 lb (92.5 kg)  01/23/17 192 lb 12.8 oz (87.5 kg)  12/21/16 198 lb 6.6 oz (90 kg)     Cardiac Studies Reviewed: 2D Echo: ECHOCARDIOGRAPHIC MEASUREMENTS 2D DIMENSIONS AORTAValues Normal Range MAIN PA ValuesNormal Range Annulus: nm*[2.3-2.9] PA Main: nm* [1.5-2.1] Aorta Sin: 3.0 cm [3.1-3.7]RIGHT VENTRICLE ST Junction: nm*[2.6-3.2] RV Base: nm* [<4.2] Asc.Aorta: nm*[2.6-3.4]RV Mid: nm* [<3.5] LEFT VENTRICLERV Length: nm* [<8.6] LVIDd: 5.3 cm [4.2-5.9]INFERIOR VENA CAVA LVIDs: 3.9 cmMax. IVC: nm* [<=2.1]  FS: 25.9 % [>25]Min. IVC: nm* SWT: 1.3 cm [0.6-1.0]------------------ PWT: 1.3 cm [0.6-1.0]nm* - not measured LEFT ATRIUM LA Diam: 4.0 cm [3.0-4.0] LA A4C Area: nm*[<20] LA Volume: BT*[51-76] _________________________________________________________________________________________ ECHOCARDIOGRAPHIC DESCRIPTIONS AORTIC ROOT  Size: Normal  Dissection: No dissection AORTIC VALVE   Leaflets: Tricuspid Morphology: MODERATELY THICKENED  Mobility: PARTIALLY MOBILE LEFT VENTRICLE  Size: NormalAnterior: Normal Contraction: Normal Lateral: Normal  Closest EF: >55% (Estimated)Septal: Normal LV Masses: No Masses Apical: Normal LVH: MILD LVHInferior: Normal Posterior: Normal  Dias.FxClass: (Grade 2) relaxation abnormal, pseudonormal MITRAL VALVE  Leaflets: NormalMobility: Fully mobile  Morphology: Normal LEFT ATRIUM  Size: Normal LA Masses: No masses IA Septum: Normal IAS MAIN PA  Size: Normal PULMONIC VALVE  Morphology: NormalMobility: Fully mobile RIGHT VENTRICLE RV Masses: No Masses Size: Normal Free Wall: Normal Contraction: Normal TRICUSPID VALVE  Leaflets: NormalMobility: Fully mobile  Morphology: Normal RIGHT ATRIUM  Size: NormalRA Other: None RA Mass: No masses PERICARDIUM Fluid: No effusion INFERIOR VENACAVA  Size: Normal Normal respiratory collapse _________________________________________________________________________________________  DOPPLER ECHO and OTHER SPECIAL PROCEDURES  Aortic: MILD ARSEVERE AS  483.7 cm/sec peak vel93.6 mmHg peak grad  51.8 mmHg mean grad0.43 cm^2 by DOPPLER  Mitral: MILD MRNo MS   MV Inflow E Vel = 106.0 cm/secMV Annulus E'Vel = 5.2 cm/sec  E/E'Ratio = 20.4 Tricuspid: TRIVIAL TR No TS  148.3 cm/sec peak TR vel 13.8 mmHg peak RV pressure Pulmonary: TRIVIAL PR No PS _________________________________________________________________________________________ INTERPRETATION NORMAL LEFT VENTRICULAR SYSTOLIC FUNCTION WITH MILD LVH NORMAL RIGHT VENTRICULAR SYSTOLIC FUNCTION MILD VALVULAR REGURGITATION (See above) SEVERE VALVULAR STENOSIS (See above) Morphology: MODERATELY THICKENED Mobility: PARTIALLY MOBILE Closest EF: >55% (Estimated) LVH: MILD LVH AVS: SEVERE AS Aortic: MILD AR Mitral: MILD MR Tricuspid: TRIVIAL TR  STS Risk Calculator: Risk of Mortality: 4.762% Renal Failure: 4.223% Permanent Stroke: 3.998% Prolonged Ventilation: 14.350% DSW Infection: 0.106% Reoperation: 7.263% Morbidity or Mortality: 22.253% Short Length of Stay: 25.712% Long Length of Stay: 8.359%  ASSESSMENT AND PLAN: 82 yo male with severe, at least stage C1, aortic stenosis. Echo images are personally reviewed and demonstrate severe calcification of all 3 aortic valve leaflets with severely restricted leaflet mobility. Doppler data shows a peak transaortic velocity approaching 5 meters/second and mean gradient > 50 mmHg. I'm certain the patient would be symptomatic if he were not so sedentary. I agree that interstitial edema is likely a sign of early heart failure related to this patient's aortic stenosis. I have reviewed the natural history of aortic stenosis with the patient and their family members who are present today. We have discussed the limitations of medical therapy and the poor prognosis associated with symptomatic aortic stenosis. We have reviewed potential treatment options, including palliative medical therapy, conventional surgical aortic valve replacement, and  transcatheter aortic valve replacement. We discussed treatment options in  the context of the patient's specific comorbid medical conditions.   Considering his progressive hemodynamic changes of aortic stenosis by serial echo studies and CXR findings of interstitial edema suggestive of CHF warrant consideration of aortic valve replacement. With comorbid conditions of advanced age, marked physical impairment related to vision loss, and history of stroke, TAVR would be a reasonable treatment option for this patient pending multidisciplinary review of his case. We discussed further evaluation for TAVR, which would include R/L heart cath, CTA studies, and formal cardiac surgical evaluation.   I have reviewed the risks, indications, and alternatives to cardiac catheterization, possible angioplasty, and stenting with the patient. Risks include but are not limited to bleeding, infection, vascular injury, stroke, myocardial infection, arrhythmia, kidney injury, radiation-related injury in the case of prolonged fluoroscopy use, emergency cardiac surgery, and death. The patient understands the risks of serious complication is 1-2 in 3762 with diagnostic cardiac cath and 1-2% or less with angioplasty/stenting.   Current medicines are reviewed with the patient today.  The patient does not have concerns regarding medicines.  Labs/ tests ordered today include:   Orders Placed This Encounter  Procedures  . CT ANGIO ABDOMEN PELVIS  W &/OR WO CONTRAST  . CT CORONARY MORPH W/CTA COR W/SCORE W/CA W/CM &/OR WO/CM  . CT ANGIO CHEST AORTA W &/OR WO CONTRAST  . Basic metabolic panel  . CBC with Differential/Platelet  . INR/PT  . Pulmonary Function Test    Disposition:   TAVR workup as above, multidisciplinary team evaluation after studies completed  Signed, Sherren Mocha, MD  10/17/2017 11:40 PM    [address], [place], [place]  [address] Phone: [phone]; Fax: [phone]

## 2017-10-16 NOTE — Patient Instructions (Addendum)
Medication Instructions:  Your provider recommends that you continue on your current medications as directed. Please refer to the Current Medication list given to you today.    Labwork: TODAY!  Testing/Procedures: Your physician has requested that you have a cardiac catheterization. Cardiac catheterization is used to diagnose and/or treat various heart conditions. Doctors may recommend this procedure for a number of different reasons. The most common reason is to evaluate chest pain. Chest pain can be a symptom of coronary artery disease (CAD), and cardiac catheterization can show whether plaque is narrowing or blocking your heart's arteries. This procedure is also used to evaluate the valves, as well as measure the blood flow and oxygen levels in different parts of your heart. For further information please visit [email or web address]. Please follow instruction sheet, as given.  Follow-Up: You will be called to arrange future appointments.   Any Other Special Instructions Will Be Listed Below (If Applicable).   [place] OFFICE [address], [address] [place] [address] Dept: [phone] Loc: [phone]  Chad Rollins  10/16/2017  You are scheduled for a Cardiac Catheterization on Monday, May 6 with Dr. Sherren Mocha.  1. Please arrive at the [facility] (Main Entrance A) at [facility]: [address] [place], [place] [address] at 8:00 AM (two hours before your procedure to ensure your preparation). Free valet parking service is available.   Special note: Every effort is made to have your procedure done on time. Please understand that emergencies sometimes delay scheduled procedures.  2. Diet: Do not eat or drink anything after midnight prior to your procedure except sips of water to take medications.  3. Labs: Your labs will be performed at the hospital after you arrive for your  procedure.  4. Medication instructions in preparation for your procedure:  1) HOLD METFORMIN the day before and day of your procedure  2) HOLD GLIPIZIDE the morning of your procedure  3) HOLD LASIX the morning of your catheterization  4) HOLD LOSARTAN the morning of your procedure  5) MAKE SURE TO TAKE YOUR ASPIRIN AND PLAVIX the morning of your cath.  6) You may take all other medications (unless listed above) with sips of water   5. Plan for one night stay--bring personal belongings. 6. Bring a current list of your medications and current insurance cards. 7. You MUST have a responsible person to drive you home. 8. Someone MUST be with you the first 24 hours after you arrive home or your discharge will be delayed. 9. Please wear clothes that are easy to get on and off and wear slip-on shoes.  Thank you for allowing [place] to care for you!   -- [facility]     If you need a refill on your cardiac medications before your next appointment, please call your pharmacy.

## 2017-10-17 ENCOUNTER — Encounter: Payer: Self-pay | Admitting: Cardiovascular Disease

## 2017-10-25 ENCOUNTER — Encounter (HOSPITAL_COMMUNITY): Payer: PPO

## 2017-11-02 ENCOUNTER — Telehealth: Payer: Self-pay | Admitting: *Deleted

## 2017-11-02 NOTE — Telephone Encounter (Addendum)
Pt contacted pre-catheterization scheduled at [facility] for: Monday Nov 06, 2017 10:30 AM Verified arrival time and place: [place] Entrance A at: 8 AM  No solid food after midnight prior to cath, clear liquids until 5 AM Verified allergies in Epic Verified diabetes medications.  Hold: Metformin 11/06/17 and 48 hours post procedure. Glipizide 11/06/17 Losartan 11/06/17 Furosemide 11/06/17  Except hold medications AM meds can be  taken pre-cath with sip of water including: ASA 81 mg  Clopidogrel 75 mg  Confirmed patient has responsible person to drive home post procedure and observe patient for 24 hours: yes  I discussed instructions with pt's daughter, [name]) , she verbalized understanding, thanked me for call.

## 2017-11-06 ENCOUNTER — Ambulatory Visit (HOSPITAL_COMMUNITY)
Admission: RE | Admit: 2017-11-06 | Discharge: 2017-11-07 | Disposition: A | Discharge status: Home / Self Care | Payer: PPO | Source: Ambulatory Visit | Attending: Cardiovascular Disease | Admitting: Cardiovascular Disease

## 2017-11-06 ENCOUNTER — Ambulatory Visit (HOSPITAL_COMMUNITY)
Admission: RE | Disposition: A | Discharge status: Home / Self Care | Payer: Self-pay | Source: Ambulatory Visit | Attending: Cardiovascular Disease

## 2017-11-06 DIAGNOSIS — Z9889 Other specified postprocedural states: Secondary | ICD-10-CM | POA: Insufficient documentation

## 2017-11-06 DIAGNOSIS — Z833 Family history of diabetes mellitus: Secondary | ICD-10-CM | POA: Diagnosis not present

## 2017-11-06 DIAGNOSIS — Z87891 Personal history of nicotine dependence: Secondary | ICD-10-CM | POA: Diagnosis not present

## 2017-11-06 DIAGNOSIS — E039 Hypothyroidism, unspecified: Secondary | ICD-10-CM | POA: Diagnosis not present

## 2017-11-06 DIAGNOSIS — Z9049 Acquired absence of other specified parts of digestive tract: Secondary | ICD-10-CM | POA: Insufficient documentation

## 2017-11-06 DIAGNOSIS — I10 Essential (primary) hypertension: Secondary | ICD-10-CM | POA: Diagnosis not present

## 2017-11-06 DIAGNOSIS — H548 Legal blindness, as defined in USA: Secondary | ICD-10-CM | POA: Diagnosis not present

## 2017-11-06 DIAGNOSIS — Z7982 Long term (current) use of aspirin: Secondary | ICD-10-CM | POA: Diagnosis not present

## 2017-11-06 DIAGNOSIS — Z7902 Long term (current) use of antithrombotics/antiplatelets: Secondary | ICD-10-CM | POA: Diagnosis not present

## 2017-11-06 DIAGNOSIS — E1140 Type 2 diabetes mellitus with diabetic neuropathy, unspecified: Secondary | ICD-10-CM | POA: Insufficient documentation

## 2017-11-06 DIAGNOSIS — Z955 Presence of coronary angioplasty implant and graft: Secondary | ICD-10-CM

## 2017-11-06 DIAGNOSIS — M069 Rheumatoid arthritis, unspecified: Secondary | ICD-10-CM | POA: Diagnosis not present

## 2017-11-06 DIAGNOSIS — Z79899 Other long term (current) drug therapy: Secondary | ICD-10-CM | POA: Diagnosis not present

## 2017-11-06 DIAGNOSIS — M1990 Unspecified osteoarthritis, unspecified site: Secondary | ICD-10-CM | POA: Diagnosis not present

## 2017-11-06 DIAGNOSIS — E669 Obesity, unspecified: Secondary | ICD-10-CM | POA: Diagnosis not present

## 2017-11-06 DIAGNOSIS — E785 Hyperlipidemia, unspecified: Secondary | ICD-10-CM | POA: Diagnosis not present

## 2017-11-06 DIAGNOSIS — Z8249 Family history of ischemic heart disease and other diseases of the circulatory system: Secondary | ICD-10-CM | POA: Insufficient documentation

## 2017-11-06 DIAGNOSIS — I2510 Atherosclerotic heart disease of native coronary artery without angina pectoris: Secondary | ICD-10-CM | POA: Diagnosis not present

## 2017-11-06 DIAGNOSIS — Z6829 Body mass index (BMI) 29.0-29.9, adult: Secondary | ICD-10-CM | POA: Diagnosis not present

## 2017-11-06 DIAGNOSIS — Z7984 Long term (current) use of oral hypoglycemic drugs: Secondary | ICD-10-CM | POA: Diagnosis not present

## 2017-11-06 DIAGNOSIS — Z886 Allergy status to analgesic agent status: Secondary | ICD-10-CM | POA: Insufficient documentation

## 2017-11-06 DIAGNOSIS — I350 Nonrheumatic aortic (valve) stenosis: Secondary | ICD-10-CM | POA: Diagnosis not present

## 2017-11-06 DIAGNOSIS — Z79890 Hormone replacement therapy (postmenopausal): Secondary | ICD-10-CM | POA: Insufficient documentation

## 2017-11-06 DIAGNOSIS — Z8673 Personal history of transient ischemic attack (TIA), and cerebral infarction without residual deficits: Secondary | ICD-10-CM | POA: Diagnosis not present

## 2017-11-06 HISTORY — PX: RIGHT/LEFT HEART CATH AND CORONARY ANGIOGRAPHY: CATH118266

## 2017-11-06 HISTORY — PX: CORONARY STENT INTERVENTION: CATH118234

## 2017-11-06 HISTORY — DX: Atherosclerotic heart disease of native coronary artery without angina pectoris: I25.10

## 2017-11-06 LAB — POCT I-STAT 3, ART BLOOD GAS (G3+)
Acid-base deficit: 1 mmol/L (ref 0.0–2.0)
Bicarbonate: 24.1 mmol/L (ref 20.0–28.0)
O2 Saturation: 98 %
PCO2 ART: 40.9 mmHg (ref 32.0–48.0)
TCO2: 25 mmol/L (ref 22–32)
pH, Arterial: 7.378 (ref 7.350–7.450)
pO2, Arterial: 109 mmHg — ABNORMAL HIGH (ref 83.0–108.0)

## 2017-11-06 LAB — BASIC METABOLIC PANEL
ANION GAP: 8 (ref 5–15)
BUN: 32 mg/dL — ABNORMAL HIGH (ref 6–20)
CO2: 28 mmol/L (ref 22–32)
Calcium: 8.9 mg/dL (ref 8.9–10.3)
Chloride: 106 mmol/L (ref 101–111)
Creatinine, Ser: 1.55 mg/dL — ABNORMAL HIGH (ref 0.61–1.24)
GFR calc Af Amer: 47 mL/min — ABNORMAL LOW (ref >60)
GFR, EST NON AFRICAN AMERICAN: 41 mL/min — AB (ref >60)
GLUCOSE: 132 mg/dL — AB (ref 65–99)
Potassium: 3.9 mmol/L (ref 3.5–5.1)
Sodium: 142 mmol/L (ref 135–145)

## 2017-11-06 LAB — CBC
HEMATOCRIT: 39.6 % (ref 39.0–52.0)
HEMOGLOBIN: 13.1 g/dL (ref 13.0–17.0)
MCH: 30.6 pg (ref 26.0–34.0)
MCHC: 33.1 g/dL (ref 30.0–36.0)
MCV: 92.5 fL (ref 78.0–100.0)
Platelets: 126 10*3/uL — ABNORMAL LOW (ref 150–400)
RBC: 4.28 MIL/uL (ref 4.22–5.81)
RDW: 13.7 % (ref 11.5–15.5)
WBC: 15.9 10*3/uL — ABNORMAL HIGH (ref 4.0–10.5)

## 2017-11-06 LAB — POCT I-STAT 3, VENOUS BLOOD GAS (G3P V)
ACID-BASE EXCESS: 1 mmol/L (ref 0.0–2.0)
Bicarbonate: 26.6 mmol/L (ref 20.0–28.0)
O2 SAT: 73 %
PCO2 VEN: 46.3 mmHg (ref 44.0–60.0)
PO2 VEN: 40 mmHg (ref 32.0–45.0)
TCO2: 28 mmol/L (ref 22–32)
pH, Ven: 7.368 (ref 7.250–7.430)

## 2017-11-06 LAB — GLUCOSE, CAPILLARY
GLUCOSE-CAPILLARY: 127 mg/dL — AB (ref 65–99)
GLUCOSE-CAPILLARY: 160 mg/dL — AB (ref 65–99)
Glucose-Capillary: 153 mg/dL — ABNORMAL HIGH (ref 65–99)

## 2017-11-06 LAB — POCT ACTIVATED CLOTTING TIME: Activated Clotting Time: 307 seconds

## 2017-11-06 SURGERY — RIGHT/LEFT HEART CATH AND CORONARY ANGIOGRAPHY
Anesthesia: LOCAL

## 2017-11-06 MED ORDER — MELATONIN 3-2 MG PO TABS: 3.0000 mg | ORAL_TABLET | Freq: Every day | ORAL | Status: DC

## 2017-11-06 MED ORDER — LEVOTHYROXINE SODIUM 75 MCG PO TABS
75.0000 ug | ORAL_TABLET | Freq: Every day | ORAL | Status: DC
  Administered 2017-11-07: 75 ug via ORAL
  Filled 2017-11-06: qty 1

## 2017-11-06 MED ORDER — MIDAZOLAM HCL 2 MG/2ML IJ SOLN
INTRAMUSCULAR | Status: AC
  Filled 2017-11-06: qty 2

## 2017-11-06 MED ORDER — SODIUM CHLORIDE 0.9% FLUSH: 3.0000 mL | Freq: Two times a day (BID) | INTRAVENOUS | Status: DC

## 2017-11-06 MED ORDER — FUROSEMIDE 20 MG PO TABS
20.0000 mg | ORAL_TABLET | Freq: Every day | ORAL | Status: DC
  Administered 2017-11-07: 20 mg via ORAL
  Filled 2017-11-06: qty 1

## 2017-11-06 MED ORDER — INSULIN ASPART 100 UNIT/ML ~~LOC~~ SOLN
0.0000 [IU] | Freq: Three times a day (TID) | SUBCUTANEOUS | Status: DC
  Administered 2017-11-06 – 2017-11-07 (×2): 3 [IU] via SUBCUTANEOUS

## 2017-11-06 MED ORDER — SODIUM CHLORIDE 0.9% FLUSH
3.0000 mL | Freq: Two times a day (BID) | INTRAVENOUS | Status: DC
  Administered 2017-11-07: 3 mL via INTRAVENOUS

## 2017-11-06 MED ORDER — MELATONIN 3 MG PO TABS
3.0000 mg | ORAL_TABLET | Freq: Every day | ORAL | Status: DC
  Administered 2017-11-06: 3 mg via ORAL
  Filled 2017-11-06: qty 1

## 2017-11-06 MED ORDER — ONDANSETRON HCL 4 MG/2ML IJ SOLN: 4.0000 mg | Freq: Four times a day (QID) | INTRAMUSCULAR | Status: DC | PRN

## 2017-11-06 MED ORDER — FINASTERIDE 5 MG PO TABS
5.0000 mg | ORAL_TABLET | Freq: Every day | ORAL | Status: DC
  Administered 2017-11-07: 10:00:00 5 mg via ORAL
  Filled 2017-11-06: qty 1

## 2017-11-06 MED ORDER — ATORVASTATIN CALCIUM 20 MG PO TABS
20.0000 mg | ORAL_TABLET | Freq: Every day | ORAL | Status: DC
  Administered 2017-11-06: 20 mg via ORAL
  Filled 2017-11-06: qty 1

## 2017-11-06 MED ORDER — SODIUM CHLORIDE 0.9% FLUSH: 3.0000 mL | INTRAVENOUS | Status: DC | PRN

## 2017-11-06 MED ORDER — SODIUM CHLORIDE 0.9 % IV SOLN: 250.0000 mL | INTRAVENOUS | Status: DC | PRN

## 2017-11-06 MED ORDER — ASPIRIN 81 MG PO CHEW: 81.0000 mg | CHEWABLE_TABLET | ORAL | Status: DC

## 2017-11-06 MED ORDER — SODIUM CHLORIDE 0.9 % WEIGHT BASED INFUSION
3.0000 mL/kg/h | INTRAVENOUS | Status: DC
  Administered 2017-11-06: 3 mL/kg/h via INTRAVENOUS

## 2017-11-06 MED ORDER — BRIMONIDINE TARTRATE 0.2 % OP SOLN
1.0000 [drp] | Freq: Two times a day (BID) | OPHTHALMIC | Status: DC
  Administered 2017-11-06 – 2017-11-07 (×2): 1 [drp] via OPHTHALMIC
  Filled 2017-11-06: qty 5

## 2017-11-06 MED ORDER — TAMSULOSIN HCL 0.4 MG PO CAPS: 0.4000 mg | ORAL_CAPSULE | Freq: Every day | ORAL | Status: DC

## 2017-11-06 MED ORDER — LIDOCAINE HCL (PF) 1 % IJ SOLN
INTRAMUSCULAR | Status: DC | PRN
  Administered 2017-11-06 (×2): 2 mL

## 2017-11-06 MED ORDER — SODIUM CHLORIDE 0.9 % WEIGHT BASED INFUSION: 1.0000 mL/kg/h | INTRAVENOUS | Status: DC

## 2017-11-06 MED ORDER — NITROGLYCERIN 1 MG/10 ML FOR IR/CATH LAB
INTRA_ARTERIAL | Status: DC | PRN
  Administered 2017-11-06: 100 ug via INTRACORONARY

## 2017-11-06 MED ORDER — FENTANYL CITRATE (PF) 100 MCG/2ML IJ SOLN
INTRAMUSCULAR | Status: DC | PRN
  Administered 2017-11-06: 25 ug via INTRAVENOUS

## 2017-11-06 MED ORDER — HEPARIN SODIUM (PORCINE) 1000 UNIT/ML IJ SOLN
INTRAMUSCULAR | Status: AC
  Filled 2017-11-06: qty 1

## 2017-11-06 MED ORDER — FENTANYL CITRATE (PF) 100 MCG/2ML IJ SOLN
INTRAMUSCULAR | Status: AC
  Filled 2017-11-06: qty 2

## 2017-11-06 MED ORDER — CLONIDINE HCL 0.1 MG PO TABS
0.2000 mg | ORAL_TABLET | Freq: Two times a day (BID) | ORAL | Status: DC
  Administered 2017-11-06 – 2017-11-07 (×2): 0.2 mg via ORAL
  Filled 2017-11-06 (×3): qty 2

## 2017-11-06 MED ORDER — GLIPIZIDE 5 MG PO TABS: 5.0000 mg | ORAL_TABLET | Freq: Two times a day (BID) | ORAL | Status: DC

## 2017-11-06 MED ORDER — CLOPIDOGREL BISULFATE 300 MG PO TABS
ORAL_TABLET | ORAL | Status: AC
  Filled 2017-11-06: qty 2

## 2017-11-06 MED ORDER — HEPARIN SODIUM (PORCINE) 1000 UNIT/ML IJ SOLN
INTRAMUSCULAR | Status: DC | PRN
  Administered 2017-11-06: 5000 [IU] via INTRAVENOUS
  Administered 2017-11-06: 4000 [IU] via INTRAVENOUS

## 2017-11-06 MED ORDER — VITAMIN D 1000 UNITS PO TABS
2000.0000 [IU] | ORAL_TABLET | Freq: Two times a day (BID) | ORAL | Status: DC
  Administered 2017-11-06 – 2017-11-07 (×2): 2000 [IU] via ORAL
  Filled 2017-11-06 (×2): qty 2

## 2017-11-06 MED ORDER — LABETALOL HCL 5 MG/ML IV SOLN: 10.0000 mg | INTRAVENOUS | Status: AC | PRN

## 2017-11-06 MED ORDER — DORZOLAMIDE HCL-TIMOLOL MAL 2-0.5 % OP SOLN
1.0000 [drp] | Freq: Two times a day (BID) | OPHTHALMIC | Status: DC
  Administered 2017-11-06 – 2017-11-07 (×2): 1 [drp] via OPHTHALMIC
  Filled 2017-11-06: qty 10

## 2017-11-06 MED ORDER — ANGIOPLASTY BOOK
Freq: Once | Status: AC
  Administered 2017-11-06: 22:00:00
  Filled 2017-11-06: qty 1

## 2017-11-06 MED ORDER — NITROGLYCERIN 1 MG/10 ML FOR IR/CATH LAB
INTRA_ARTERIAL | Status: AC
  Filled 2017-11-06: qty 10

## 2017-11-06 MED ORDER — VERAPAMIL HCL 2.5 MG/ML IV SOLN
INTRAVENOUS | Status: DC | PRN
  Administered 2017-11-06: 10 mL via INTRA_ARTERIAL

## 2017-11-06 MED ORDER — IOHEXOL 350 MG/ML SOLN
INTRAVENOUS | Status: DC | PRN
  Administered 2017-11-06: 70 mL

## 2017-11-06 MED ORDER — HYDRALAZINE HCL 20 MG/ML IJ SOLN
5.0000 mg | INTRAMUSCULAR | Status: AC | PRN
  Administered 2017-11-06: 5 mg via INTRAVENOUS
  Filled 2017-11-06: qty 1

## 2017-11-06 MED ORDER — MIDAZOLAM HCL 2 MG/2ML IJ SOLN
INTRAMUSCULAR | Status: DC | PRN
  Administered 2017-11-06: 1 mg via INTRAVENOUS

## 2017-11-06 MED ORDER — ALUM & MAG HYDROXIDE-SIMETH 200-200-20 MG/5ML PO SUSP
30.0000 mL | Freq: Four times a day (QID) | ORAL | Status: DC | PRN
  Administered 2017-11-06: 20:00:00 30 mL via ORAL
  Filled 2017-11-06: qty 30

## 2017-11-06 MED ORDER — BUSPIRONE HCL 15 MG PO TABS
15.0000 mg | ORAL_TABLET | Freq: Three times a day (TID) | ORAL | Status: DC
  Administered 2017-11-06 – 2017-11-07 (×3): 15 mg via ORAL
  Filled 2017-11-06 (×3): qty 1

## 2017-11-06 MED ORDER — ASPIRIN EC 81 MG PO TBEC
81.0000 mg | DELAYED_RELEASE_TABLET | Freq: Every day | ORAL | Status: DC
  Administered 2017-11-07: 10:00:00 81 mg via ORAL
  Filled 2017-11-06: qty 1

## 2017-11-06 MED ORDER — METOPROLOL TARTRATE 25 MG PO TABS
25.0000 mg | ORAL_TABLET | Freq: Two times a day (BID) | ORAL | Status: DC
  Administered 2017-11-06 – 2017-11-07 (×2): 25 mg via ORAL
  Filled 2017-11-06 (×2): qty 1

## 2017-11-06 MED ORDER — HEPARIN (PORCINE) IN NACL 1000-0.9 UT/500ML-% IV SOLN
INTRAVENOUS | Status: AC
  Filled 2017-11-06: qty 1000

## 2017-11-06 MED ORDER — MELATONIN 5 MG PO TABS: 5.0000 mg | ORAL_TABLET | Freq: Every day | ORAL | Status: DC

## 2017-11-06 MED ORDER — CLOPIDOGREL BISULFATE 75 MG PO TABS
75.0000 mg | ORAL_TABLET | Freq: Every day | ORAL | Status: DC
  Filled 2017-11-06: qty 1

## 2017-11-06 MED ORDER — PANTOPRAZOLE SODIUM 40 MG PO TBEC
40.0000 mg | DELAYED_RELEASE_TABLET | Freq: Every day | ORAL | Status: DC
  Administered 2017-11-07: 40 mg via ORAL
  Filled 2017-11-06 (×2): qty 1

## 2017-11-06 MED ORDER — SODIUM CHLORIDE 0.9 % WEIGHT BASED INFUSION
1.0000 mL/kg/h | INTRAVENOUS | Status: AC
  Administered 2017-11-06: 1 mL/kg/h via INTRAVENOUS

## 2017-11-06 MED ORDER — LATANOPROST 0.005 % OP SOLN
1.0000 [drp] | Freq: Every day | OPHTHALMIC | Status: DC
  Administered 2017-11-06: 1 [drp] via OPHTHALMIC
  Filled 2017-11-06: qty 2.5

## 2017-11-06 MED ORDER — HEPARIN (PORCINE) IN NACL 2-0.9 UNITS/ML
INTRAMUSCULAR | Status: AC | PRN
  Administered 2017-11-06 (×2): 500 mL

## 2017-11-06 MED ORDER — CLOPIDOGREL BISULFATE 300 MG PO TABS
ORAL_TABLET | ORAL | Status: DC | PRN
  Administered 2017-11-06: 600 mg via ORAL

## 2017-11-06 MED ORDER — VERAPAMIL HCL 2.5 MG/ML IV SOLN
INTRAVENOUS | Status: AC
  Filled 2017-11-06: qty 2

## 2017-11-06 MED ORDER — CLOPIDOGREL BISULFATE 75 MG PO TABS: 75.0000 mg | ORAL_TABLET | ORAL | Status: DC

## 2017-11-06 MED ORDER — LIDOCAINE HCL (PF) 1 % IJ SOLN
INTRAMUSCULAR | Status: AC
  Filled 2017-11-06: qty 30

## 2017-11-06 SURGICAL SUPPLY — 19 items
BALLN SAPPHIRE 2.5X15 (BALLOONS) ×2
BALLN SAPPHIRE ~~LOC~~ 3.75X18 (BALLOONS) ×2 IMPLANT
BALLOON SAPPHIRE 2.5X15 (BALLOONS) ×1 IMPLANT
CATH BALLN WEDGE 5F 110CM (CATHETERS) ×2 IMPLANT
CATH INFINITI 5 FR JL3.5 (CATHETERS) ×2 IMPLANT
CATH INFINITI JR4 5F (CATHETERS) ×2 IMPLANT
CATH VISTA GUIDE 6FR JR4 (CATHETERS) ×2 IMPLANT
DEVICE RAD COMP TR BAND LRG (VASCULAR PRODUCTS) ×2 IMPLANT
GLIDESHEATH SLEND SS 6F .021 (SHEATH) ×2 IMPLANT
GUIDEWIRE INQWIRE 1.5J.035X260 (WIRE) ×1 IMPLANT
INQWIRE 1.5J .035X260CM (WIRE) ×2
KIT ENCORE 26 ADVANTAGE (KITS) ×2 IMPLANT
KIT HEART LEFT (KITS) ×2 IMPLANT
PACK CARDIAC CATHETERIZATION (CUSTOM PROCEDURE TRAY) ×2 IMPLANT
SHEATH GLIDE SLENDER 4/5FR (SHEATH) ×2 IMPLANT
STENT SYNERGY DES 3.5X38 (Permanent Stent) ×2 IMPLANT
TRANSDUCER W/STOPCOCK (MISCELLANEOUS) ×2 IMPLANT
TUBING CIL FLEX 10 FLL-RA (TUBING) ×2 IMPLANT
WIRE COUGAR XT STRL 190CM (WIRE) ×2 IMPLANT

## 2017-11-06 NOTE — Progress Notes (Signed)
TR BAND REMOVAL  LOCATION:  right radial  DEFLATED PER PROTOCOL:  Yes.    TIME BAND OFF / DRESSING APPLIED:   1800   SITE UPON ARRIVAL:   Level 0  SITE AFTER BAND REMOVAL:  Level 0  CIRCULATION SENSATION AND MOVEMENT:  Within Normal Limits  Yes.    COMMENTS:    

## 2017-11-06 NOTE — Interval H&P Note (Signed)
History and Physical Interval Note:  11/06/2017 12:01 PM  Chad Rollins  has presented today for surgery, with the diagnosis of as  The various methods of treatment have been discussed with the patient and family. After consideration of risks, benefits and other options for treatment, the patient has consented to  Procedure(s): RIGHT/LEFT HEART CATH AND CORONARY ANGIOGRAPHY (N/A) as a surgical intervention .  The patient's history has been reviewed, patient examined, no change in status, stable for surgery.  I have reviewed the patient's chart and labs.  Questions were answered to the patient's satisfaction.     Sherren Mocha

## 2017-11-07 ENCOUNTER — Telehealth: Payer: Self-pay

## 2017-11-07 ENCOUNTER — Encounter (HOSPITAL_COMMUNITY): Payer: Self-pay | Admitting: Cardiovascular Disease

## 2017-11-07 DIAGNOSIS — E1140 Type 2 diabetes mellitus with diabetic neuropathy, unspecified: Secondary | ICD-10-CM | POA: Diagnosis not present

## 2017-11-07 DIAGNOSIS — I25118 Atherosclerotic heart disease of native coronary artery with other forms of angina pectoris: Secondary | ICD-10-CM

## 2017-11-07 DIAGNOSIS — I350 Nonrheumatic aortic (valve) stenosis: Secondary | ICD-10-CM | POA: Diagnosis not present

## 2017-11-07 DIAGNOSIS — Z886 Allergy status to analgesic agent status: Secondary | ICD-10-CM | POA: Diagnosis not present

## 2017-11-07 DIAGNOSIS — Z87891 Personal history of nicotine dependence: Secondary | ICD-10-CM | POA: Diagnosis not present

## 2017-11-07 DIAGNOSIS — H548 Legal blindness, as defined in USA: Secondary | ICD-10-CM | POA: Diagnosis not present

## 2017-11-07 DIAGNOSIS — E669 Obesity, unspecified: Secondary | ICD-10-CM | POA: Diagnosis not present

## 2017-11-07 DIAGNOSIS — E039 Hypothyroidism, unspecified: Secondary | ICD-10-CM | POA: Diagnosis not present

## 2017-11-07 DIAGNOSIS — I2510 Atherosclerotic heart disease of native coronary artery without angina pectoris: Secondary | ICD-10-CM | POA: Diagnosis not present

## 2017-11-07 DIAGNOSIS — E785 Hyperlipidemia, unspecified: Secondary | ICD-10-CM | POA: Diagnosis not present

## 2017-11-07 DIAGNOSIS — M1990 Unspecified osteoarthritis, unspecified site: Secondary | ICD-10-CM | POA: Diagnosis not present

## 2017-11-07 DIAGNOSIS — I10 Essential (primary) hypertension: Secondary | ICD-10-CM | POA: Diagnosis not present

## 2017-11-07 DIAGNOSIS — M069 Rheumatoid arthritis, unspecified: Secondary | ICD-10-CM | POA: Diagnosis not present

## 2017-11-07 LAB — BASIC METABOLIC PANEL
ANION GAP: 7 (ref 5–15)
BUN: 28 mg/dL — ABNORMAL HIGH (ref 6–20)
CALCIUM: 8.5 mg/dL — AB (ref 8.9–10.3)
CHLORIDE: 110 mmol/L (ref 101–111)
CO2: 25 mmol/L (ref 22–32)
CREATININE: 1.34 mg/dL — AB (ref 0.61–1.24)
GFR calc Af Amer: 56 mL/min — ABNORMAL LOW (ref >60)
GFR calc non Af Amer: 48 mL/min — ABNORMAL LOW (ref >60)
Glucose, Bld: 164 mg/dL — ABNORMAL HIGH (ref 65–99)
Potassium: 4.1 mmol/L (ref 3.5–5.1)
SODIUM: 142 mmol/L (ref 135–145)

## 2017-11-07 LAB — CBC
HCT: 36.1 % — ABNORMAL LOW (ref 39.0–52.0)
HEMOGLOBIN: 12 g/dL — AB (ref 13.0–17.0)
MCH: 30.7 pg (ref 26.0–34.0)
MCHC: 33.2 g/dL (ref 30.0–36.0)
MCV: 92.3 fL (ref 78.0–100.0)
PLATELETS: 119 10*3/uL — AB (ref 150–400)
RBC: 3.91 MIL/uL — AB (ref 4.22–5.81)
RDW: 13.7 % (ref 11.5–15.5)
WBC: 15 10*3/uL — AB (ref 4.0–10.5)

## 2017-11-07 LAB — GLUCOSE, CAPILLARY: Glucose-Capillary: 151 mg/dL — ABNORMAL HIGH (ref 65–99)

## 2017-11-07 MED ORDER — NITROGLYCERIN 0.4 MG SL SUBL: 0.4000 mg | SUBLINGUAL_TABLET | SUBLINGUAL | 2 refills | Status: AC | PRN

## 2017-11-07 NOTE — Discharge Summary (Addendum)
Discharge Summary    Patient ID: Chad Rollins,  MRN: [id number], DOB/AGE: 80-Sep-1937 80 y.o.  Admit date: 11/06/2017 Discharge date: 11/07/2017  Primary Care Provider: Idelle Crouch Primary Cardiologist: Dr. Ubaldo Glassing (Dr. Burt Knack TAVR)  Discharge Diagnoses    Active Problems:   Severe aortic stenosis   Allergies Allergies  Allergen Reactions  . Acetaminophen Other (See Comments)    Long period of time, effects kidney    Diagnostic Studies/Procedures    Cath: 11/06/17  Coronary Findings   Diagnostic  Dominance: Right  Right Coronary Artery  Prox RCA to Mid RCA lesion 90% stenosed  Prox RCA to Mid RCA lesion is 90% stenosed. diffuse mid-segmental disease  Intervention   Prox RCA to Mid RCA lesion  Stent  Lesion crossed with guidewire using a WIRE COUGAR XT STRL 190CM. Pre-stent angioplasty was performed using a BALLOON SAPPHIRE 2.5X15. A drug-eluting stent was successfully placed using a STENT SYNERGY DES 3.5X38. Post-stent angioplasty was performed using a BALLOON SAPPHIRE [facility] [id number]. Maximum pressure: 18 atm. A JR4 guide catheter is used. Heparin is used for anticoagulation and a therapeutic ACT is achieved (307). There is TIMI-3 flow pre-and post PCI.  Post-Intervention Lesion Assessment  There is no residual stenosis post intervention.   Conclusion   1. Severe 2 vessel CAD with severe stenosis of a large, dominant RCA and severe stenosis of a small OM branch 2. Severe aortic stenosis with mean gradient 54 mmHg and calculated AVA 0.83  Recommend: DAPT with ASA and clopidogrel x 6 months. Continue multidisciplinary evaluation for TAVR Medical Rx for residual CAD  _____________   History of Present Illness      82 y.o. male who presented to the office with Dr. Burt Knack for evaluation of severe aortic stenosis, referred by Dr Ubaldo Glassing.   He is a widower since approximately 6 years ago. The patient lives alone. His son lives nearby. He has someone come in and help him  with tasks around the house. He hasn't driven in 4 years because of poor eyesight. He no longer works in the yard or even walks to [employer] anymore because he's concerned about falling with his poor vision. He gets around well in the house. He uses a cane or walker in the house. He denied shortness of breath, chest pain, orthopnea, PND, leg swelling, or lightheadedness at his current level of activity. He did report having occasional 'vertigo.'  The patient has a history of TIA many years ago, but has no residual deficit. He primarily had tingling around one side of his mouth with facial droop and speech deficit. 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 His children have noticed that his speech has been more deliberate since his stroke.   He's had a heart murmur for many years and has been followed for aortic stenosis with serial exams and echo studies. He recently had a CXR demonstrating interstitial pulmonary edema and there was concern that aortic stenosis is now precipitating diastolic heart failure. He was referred back to Dr Ubaldo Glassing who performed an echo demonstrating worsening aortic stenosis with a mean transaortic gradient of 52 mmHg. Given these findings the option of TAVR was discussed with patient and the process involved. Patient was agreeable to begin work up and set up for outpatient cath.    Hospital Course     Underwent cardiac cath noted above with Dr. Burt Knack with successful PCI/DES x1 to the [facility]. Plan for DAPT with  ASA/plavix. No complications noted post cath. Morning labs were stable. He was able to work with cardiac rehab with rolling walker without complaints.    General: Well developed, well nourished, male appearing in no acute distress. Head: Normocephalic, atraumatic.  Neck: Supple without bruits, JVD. Lungs:  Resp regular and unlabored, CTA. Heart: RRR, S1, S2, 3/6 harsh systolic murmur; no rub. Abdomen: Soft, non-tender, non-distended with  normoactive bowel sounds.  Extremities: No clubbing, cyanosis, edema. Distal pedal pulses are 2+ bilaterally. R radial cath site stable without bruising or hematoma Neuro: Alert and oriented X 3. Moves all extremities spontaneously. Psych: Normal affect.  Chad Rollins was seen by Dr. Irish Lack and determined stable for discharge home. Follow up in the office has been arranged. Medications are listed below.  _____________  Discharge Vitals Blood pressure (!) 130/52, pulse 68, temperature 98.3 F (36.8 C), resp. rate (!) 22, height 6' (1.829 m), weight 201 lb 11.5 oz (91.5 kg), SpO2 100 %.  Filed Weights   11/06/17 0806 11/07/17 0240  Weight: 202 lb (91.6 kg) 201 lb 11.5 oz (91.5 kg)    Labs & Radiologic Studies    CBC Recent Labs    11/06/17 0810 11/07/17 0322  WBC 15.9* 15.0*  HGB 13.1 12.0*  HCT 39.6 36.1*  MCV 92.5 92.3  PLT 126* 938*   Basic Metabolic Panel Recent Labs    11/06/17 0810 11/07/17 0322  NA 142 142  K 3.9 4.1  CL 106 110  CO2 28 25  GLUCOSE 132* 164*  BUN 32* 28*  CREATININE 1.55* 1.34*  CALCIUM 8.9 8.5*   Liver Function Tests No results for input(s): AST, ALT, ALKPHOS, BILITOT, PROT, ALBUMIN in the last 72 hours. No results for input(s): LIPASE, AMYLASE in the last 72 hours. Cardiac Enzymes No results for input(s): CKTOTAL, CKMB, CKMBINDEX, TROPONINI in the last 72 hours. BNP Invalid input(s): POCBNP D-Dimer No results for input(s): DDIMER in the last 72 hours. Hemoglobin A1C No results for input(s): HGBA1C in the last 72 hours. Fasting Lipid Panel No results for input(s): CHOL, HDL, LDLCALC, TRIG, CHOLHDL, LDLDIRECT in the last 72 hours. Thyroid Function Tests No results for input(s): TSH, T4TOTAL, T3FREE, THYROIDAB in the last 72 hours.  Invalid input(s): FREET3 _____________  No results found. Disposition   Pt is being discharged home today in good condition.  Follow-up Plans & Appointments    Follow-up Information    Gaye Pollack, MD Follow up on 11/22/2017.   Specialty:  Cardiothoracic Surgery Why:  at 2pm for your follow up appt.  Contact information: [address] [place] [address] [phone]          Discharge Instructions    Call MD for:  redness, tenderness, or signs of infection (pain, swelling, redness, odor or green/yellow discharge around incision site)   Complete by:  As directed    Diet - low sodium heart healthy   Complete by:  As directed    Discharge instructions   Complete by:  As directed    Radial Site Care Refer to this sheet in the next few weeks. These instructions provide you with information on caring for yourself after your procedure. Your caregiver may also give you more specific instructions. Your treatment has been planned according to current medical practices, but problems sometimes occur. Call your caregiver if you have any problems or questions after your procedure. HOME CARE INSTRUCTIONS You may shower the day after the procedure.Remove the bandage (dressing) and  gently wash the site with plain soap and water.Gently pat the site dry.  Do not apply powder or lotion to the site.  Do not submerge the affected site in water for 3 to 5 days.  Inspect the site at least twice daily.  Do not flex or bend the affected arm for 24 hours.  No lifting over 5 pounds (2.3 kg) for 5 days after your procedure.  Do not drive home if you are discharged the same day of the procedure. Have someone else drive you.  You may drive 24 hours after the procedure unless otherwise instructed by your caregiver.  What to expect: Any bruising will usually fade within 1 to 2 weeks.  Blood that collects in the tissue (hematoma) may be painful to the touch. It should usually decrease in size and tenderness within 1 to 2 weeks.  SEEK IMMEDIATE MEDICAL CARE IF: You have unusual pain at the radial site.  You have redness, warmth, swelling, or pain at the radial site.  You have  drainage (other than a small amount of blood on the dressing).  You have chills.  You have a fever or persistent symptoms for more than 72 hours.  You have a fever and your symptoms suddenly get worse.  Your arm becomes pale, cool, tingly, or numb.  You have heavy bleeding from the site. Hold pressure on the site.   PLEASE DO NOT MISS ANY DOSES OF YOUR PLAVIX!!!!! Also keep a log of you blood pressures and bring back to your follow up appt. Please call the office with any questions.   Patients taking blood thinners should generally stay away from medicines like ibuprofen, Advil, Motrin, naproxen, and Aleve due to risk of stomach bleeding. You may take Tylenol as directed or talk to your primary doctor about alternatives.   Increase activity slowly   Complete by:  As directed       Discharge Medications     Medication List    TAKE these medications   aspirin EC 81 MG tablet Take 81 mg by mouth daily.   atorvastatin 20 MG tablet Commonly known as:  LIPITOR Take 20 mg by mouth at bedtime.   brimonidine 0.2 % ophthalmic solution Commonly known as:  ALPHAGAN Place 1 drop into the left eye 2 (two) times daily.   busPIRone 15 MG tablet Commonly known as:  BUSPAR Take 15 mg by mouth 3 (three) times daily.   cloNIDine 0.2 MG tablet Commonly known as:  CATAPRES Take 1 tablet (0.2 mg total) by mouth 2 (two) times daily.   clopidogrel 75 MG tablet Commonly known as:  PLAVIX Take 75 mg by mouth at bedtime.   dorzolamide-timolol 22.3-6.8 MG/ML ophthalmic solution Commonly known as:  COSOPT Place 1 drop into both eyes 2 (two) times daily.   finasteride 5 MG tablet Commonly known as:  PROSCAR Take 1 tablet (5 mg total) by mouth daily.   furosemide 20 MG tablet Commonly known as:  LASIX Take 20 mg by mouth daily.   glipiZIDE 5 MG tablet Commonly known as:  GLUCOTROL Take 5 mg by mouth 2 (two) times daily before a meal.   latanoprost 0.005 % ophthalmic solution Commonly  known as:  XALATAN Place 1 drop into both eyes at bedtime.   levothyroxine 75 MCG tablet Commonly known as:  SYNTHROID, LEVOTHROID Take 75 mcg by mouth daily before breakfast.   losartan 50 MG tablet Commonly known as:  COZAAR Take 50 mg by mouth 2 (two) times  daily.   Melatonin 5 MG Tabs Take 5 mg by mouth at bedtime.   metFORMIN 1000 MG tablet Commonly known as:  GLUCOPHAGE Take 1,000 mg by mouth 2 (two) times daily with a meal.   metoprolol tartrate 25 MG tablet Commonly known as:  LOPRESSOR Take 25 mg by mouth 2 (two) times daily.   nitroGLYCERIN 0.4 MG SL tablet Commonly known as:  NITROSTAT Place 1 tablet (0.4 mg total) under the tongue every 5 (five) minutes as needed.   nystatin powder Commonly known as:  MYCOSTATIN/NYSTOP Apply 1 g topically 2 (two) times daily as needed (rash under arms).   pantoprazole 40 MG tablet Commonly known as:  PROTONIX Take 1 tablet (40 mg total) by mouth daily.   PRESERVISION AREDS 2 Caps Take 1 capsule by mouth 2 (two) times daily.   tamsulosin 0.4 MG Caps capsule Commonly known as:  FLOMAX Take 1 capsule (0.4 mg total) by mouth daily after supper.   vitamin B-12 1000 MCG tablet Commonly known as:  CYANOCOBALAMIN Take 1,000 mcg by mouth at bedtime.   Vitamin D3 2000 units capsule Take 2,000 Units by mouth 2 (two) times daily.       Aspirin prescribed at discharge?  Yes High Intensity Statin Prescribed? (Lipitor 40-69m or Crestor 20-466m: No: Consider increasing as outpatient, defer to primary cardiologist. Beta Blocker Prescribed? Yes For EF <40%, was ACEI/ARB Prescribed? Yes ADP Receptor Inhibitor Prescribed? (i.e. Plavix etc.-Includes Medically Managed Patients): Yes For EF <40%, Aldosterone Inhibitor Prescribed? No: EF ok Was EF assessed during THIS hospitalization? Recent echo as outpatient with primary cardiologist. Was Cardiac Rehab II ordered? (Included Medically managed Patients): Yes   Outstanding Labs/Studies    N/a   Duration of Discharge Encounter   Greater than 30 minutes including physician time.  Signed, LiReino BellisP-C 11/07/2017, 9:20 AM   I have examined the patient and reviewed assessment and plan and discussed with patient.  Agree with above as stated.  Right radial site intact.  No hematoma.  Continue dual antiplatelet therapy.  Continue Protonix for GERD symptoms.  Continue with work-up for TAVR.  He walked with cardiac rehab.  Okay for discharge later today.  JaLarae Grooms

## 2017-11-07 NOTE — Telephone Encounter (Signed)
Kim contacted me in regards to the pt's DC medication list that included instructions for Clonidine.  Per [name] the pt has not take this medication for a long time and wanted to clarify if he is suppose to restart.  I reviewed Dr Bethanne Ginger note from 09/25/2017 and clonidine is not documented.  I made Dr Burt Knack aware and he advised that the pt stop Clonidine. Kim notified of instruction to stop clonidine.  Medication list updated.

## 2017-11-07 NOTE — Research (Deleted)
CADFEM Informed Consent   Subject Name: Chad Rollins  Subject met inclusion and exclusion criteria.  The informed consent form, study requirements and expectations were reviewed with the subject and questions and concerns were addressed prior to the signing of the consent form.  The subject verbalized understanding of the trail requirements.  The subject agreed to participate in the CADFEM trial and signed the informed consent.  The informed consent was obtained prior to performance of any protocol-specific procedures for the subject.  A copy of the signed informed consent was given to the subject and a copy was placed in the subject's medical record.  Christena Flake 11/07/2017, 07:55 AM

## 2017-11-07 NOTE — Progress Notes (Signed)
CARDIAC REHAB PHASE I   PRE:  Rate/Rhythm: Sinus Rhythm 77  BP:  Supine: 126/49     SaO2: 100%  MODE:  Ambulation: 200 ft   POST:  Rate/Rhythem: 78  BP:    Sitting: 140/48     SaO2: 100%  [id number] Patient ambulated in the hallway with assistance times two using a rolling walker and a gait belt. Patient tolerated ambulation without difficulty. Reviewed exercise instructions with the patient his daughter and caretaker. Discussed heart healthy diet information, stent card and when to call 911. Chad Rollins says he is interested in participating in phase 2 cardiac rehab at [facility] when he is able. Patient assisted to chair with call light within reach.   Harrell Gave RN BSN

## 2017-11-07 NOTE — Research (Signed)
RADPH Informed Consent   Subject Name: SHANARD TRETO  Subject met inclusion and exclusion criteria.  The informed consent form, study requirements and expectations were reviewed with the subject and questions and concerns were addressed prior to the signing of the consent form.  The subject verbalized understanding of the trail requirements.  The subject agreed to participate in the [facility] trial and signed the informed consent.  The informed consent was obtained prior to performance of any protocol-specific procedures for the subject.  A copy of the signed informed consent was given to the subject and a copy was placed in the subject's medical record.  Christena Flake 11/07/2017, 07:55 AM

## 2017-11-08 MED FILL — Heparin Sod (Porcine)-NaCl IV Soln 1000 Unit/500ML-0.9%: INTRAVENOUS | Qty: 1000 | Status: AC

## 2017-11-20 ENCOUNTER — Ambulatory Visit (HOSPITAL_COMMUNITY): Admission: RE | Admit: 2017-11-20 | Payer: PPO | Source: Ambulatory Visit

## 2017-11-20 ENCOUNTER — Ambulatory Visit (HOSPITAL_COMMUNITY)
Admission: RE | Admit: 2017-11-20 | Discharge: 2017-11-20 | Disposition: A | Discharge status: Home / Self Care | Payer: PPO | Source: Ambulatory Visit | Attending: Cardiovascular Disease | Admitting: Cardiovascular Disease

## 2017-11-20 ENCOUNTER — Ambulatory Visit (HOSPITAL_BASED_OUTPATIENT_CLINIC_OR_DEPARTMENT_OTHER)
Admission: RE | Admit: 2017-11-20 | Discharge: 2017-11-20 | Disposition: A | Discharge status: Home / Self Care | Payer: PPO | Source: Ambulatory Visit | Attending: Cardiovascular Disease | Admitting: Cardiovascular Disease

## 2017-11-20 ENCOUNTER — Ambulatory Visit: Payer: PPO | Attending: Cardiovascular Disease | Admitting: Physical Therapy

## 2017-11-20 ENCOUNTER — Other Ambulatory Visit: Payer: Self-pay

## 2017-11-20 ENCOUNTER — Encounter: Payer: Self-pay | Admitting: Physical Therapy

## 2017-11-20 DIAGNOSIS — N2889 Other specified disorders of kidney and ureter: Secondary | ICD-10-CM | POA: Diagnosis not present

## 2017-11-20 DIAGNOSIS — R0602 Shortness of breath: Secondary | ICD-10-CM | POA: Diagnosis not present

## 2017-11-20 DIAGNOSIS — I350 Nonrheumatic aortic (valve) stenosis: Secondary | ICD-10-CM

## 2017-11-20 DIAGNOSIS — K769 Liver disease, unspecified: Secondary | ICD-10-CM | POA: Diagnosis not present

## 2017-11-20 DIAGNOSIS — R2689 Other abnormalities of gait and mobility: Secondary | ICD-10-CM | POA: Insufficient documentation

## 2017-11-20 DIAGNOSIS — I700 Atherosclerosis of aorta: Secondary | ICD-10-CM | POA: Diagnosis not present

## 2017-11-20 DIAGNOSIS — I5033 Acute on chronic diastolic (congestive) heart failure: Secondary | ICD-10-CM | POA: Diagnosis not present

## 2017-11-20 DIAGNOSIS — I6522 Occlusion and stenosis of left carotid artery: Secondary | ICD-10-CM | POA: Insufficient documentation

## 2017-11-20 DIAGNOSIS — R918 Other nonspecific abnormal finding of lung field: Secondary | ICD-10-CM | POA: Diagnosis not present

## 2017-11-20 DIAGNOSIS — R590 Localized enlarged lymph nodes: Secondary | ICD-10-CM | POA: Diagnosis not present

## 2017-11-20 LAB — PULMONARY FUNCTION TEST
DL/VA % pred: 68 %
DL/VA: 3.22 ml/min/mmHg/L
DLCO UNC: 15.07 ml/min/mmHg
DLCO unc % pred: 43 %
FEF 25-75 Post: 3.12 L/sec
FEF 25-75 Pre: 3.19 L/sec
FEF2575-%CHANGE-POST: -2 %
FEF2575-%PRED-POST: 144 %
FEF2575-%Pred-Pre: 148 %
FEV1-%CHANGE-POST: -3 %
FEV1-%PRED-POST: 77 %
FEV1-%PRED-PRE: 81 %
FEV1-POST: 2.42 L
FEV1-Pre: 2.52 L
FEV1FVC-%Change-Post: 2 %
FEV1FVC-%PRED-PRE: 131 %
FEV6-%Change-Post: -6 %
FEV6-%PRED-POST: 62 %
FEV6-%Pred-Pre: 66 %
FEV6-POST: 2.54 L
FEV6-PRE: 2.71 L
FEV6FVC-%PRED-POST: 106 %
FEV6FVC-%Pred-Pre: 106 %
FVC-%CHANGE-POST: -6 %
FVC-%PRED-POST: 58 %
FVC-%PRED-PRE: 62 %
FVC-POST: 2.54 L
FVC-Pre: 2.71 L
PRE FEV6/FVC RATIO: 100 %
Post FEV1/FVC ratio: 95 %
Post FEV6/FVC ratio: 100 %
Pre FEV1/FVC ratio: 93 %
RV % PRED: 153 %
RV: 4.25 L
TLC % pred: 95 %
TLC: 7.16 L

## 2017-11-20 MED ORDER — IOPAMIDOL (ISOVUE-370) INJECTION 76%
INTRAVENOUS | Status: AC
  Administered 2017-11-20: 80 mL
  Filled 2017-11-20: qty 100

## 2017-11-20 MED ORDER — ALBUTEROL SULFATE (2.5 MG/3ML) 0.083% IN NEBU
2.5000 mg | INHALATION_SOLUTION | Freq: Once | RESPIRATORY_TRACT | Status: AC
  Administered 2017-11-20: 2.5 mg via RESPIRATORY_TRACT

## 2017-11-20 NOTE — Therapy (Signed)
[place], [place], [address] Phone: [phone]   Fax:  [phone]  Physical Therapy Evaluation  Patient Details  Name: Chad Rollins MRN: [id number] Date of Birth: [birth year] Referring Provider: Delbert Harness   Encounter Date: 11/20/2017  PT End of Session - 11/20/17 1234    Visit Number  1    Number of Visits  1    Date for PT Re-Evaluation  11/20/17    Authorization Type  Medicare     PT Start Time  6269    PT Stop Time  1220    PT Time Calculation (min)  35 min    Activity Tolerance  Patient tolerated treatment well    Behavior During Therapy  [facility] for tasks assessed/performed       Past Medical History:  Diagnosis Date  . Allergic rhinitis   . Aortic stenosis   . BPH with obstruction/lower urinary tract symptoms   . CAD (coronary artery disease)    11/06/17 PCI/DES x1 to mRCA.  . Collagen vascular disease ([place])    Rheumatoid Arthritis  . Continuous right lower quadrant pain   . Diabetes mellitus ([place])   . HLD (hyperlipidemia)   . HTN (hypertension)   . Hypothyroidism   . Injury of right rotator cuff   . Obesity   . Osteoarthritis   . Peripheral neuropathy   . Rheumatoid arthritis ([place])   . Stroke [facility])     Past Surgical History:  Procedure Laterality Date  . CHOLECYSTECTOMY    . CORONARY STENT INTERVENTION N/A 11/06/2017   Procedure: CORONARY STENT INTERVENTION;  Surgeon: Sherren Mocha, MD;  Location: [facility];  Service: Cardiovascular;  Laterality: N/A;  . Lipoma excisions    . RIGHT/LEFT HEART CATH AND CORONARY ANGIOGRAPHY N/A 11/06/2017   Procedure: RIGHT/LEFT HEART CATH AND CORONARY ANGIOGRAPHY;  Surgeon: Sherren Mocha, MD;  Location: [facility];  Service: Cardiovascular;  Laterality: N/A;  . Surgery for sleep apnea    . TONSILLECTOMY      There were no vitals filed for this visit.   Subjective Assessment - 11/20/17 1151    Subjective  Patient presents  with decreased endurance with activity and decreased mobility. He uses a cane for gait. He is hard of hearing and has decreased vison.     Currently in Pain?  No/denies         [facility] PT Assessment - 11/20/17 0001      Assessment   Medical Diagnosis  Severe Aortic Stenosis     Referring Provider  Mischael Cooper    Onset Date/Surgical Date  -- 3 months prior     Next MD Visit  After this appointment     Prior Therapy  None       Precautions   Precautions  None      Restrictions   Weight Bearing Restrictions  No      Balance Screen   Has the patient fallen in the past 6 months  No    Has the patient had a decrease in activity level because of a fear of falling?   No    Is the patient reluctant to leave their home because of a fear of falling?   No      Home Environment   Additional Comments  Syteps inside the house but patient dosent go in that part of the house      Prior Function   Level of Independence  Independent with household mobility with device limited by vision      Cognition   Overall Cognitive Status  Within Functional Limits for tasks assessed    Attention  Focused    Focused Attention  Appears intact    Memory  Appears intact    Awareness  Appears intact    Problem Solving  Appears intact      Observation/Other Assessments   Observations  HOH and vision difficulty      Sensation   Light Touch  Appears Intact      Coordination   Gross Motor Movements are Fluid and Coordinated  Yes    Fine Motor Movements are Fluid and Coordinated  Yes      Posture/Postural Control   Posture Comments  rounded shoulders; forward d      ROM / Strength   AROM / PROM / Strength  AROM;PROM;Strength      AROM   Overall AROM Comments  limited right shoulder flexion       Strength   Strength Assessment Site  Shoulder;Hand;Hip;Knee    Right/Left Shoulder  Right;Left    Right Shoulder Flexion  3/5    Right Shoulder Internal Rotation  3+/5    Right Shoulder External  Rotation  3/5    Left Shoulder Flexion  4/5    Left Shoulder Internal Rotation  4+/5    Left Shoulder External Rotation  4/5    Right/Left hand  Right;Left    Right Hand Grip (lbs)  20     Left Hand Grip (lbs)  15       [facility] Pre-Surgical Assessment - 11/20/17 0001    5 Meter Walk Test- trial 1  13 sec    5 Meter Walk Test- trial 2  13 sec.     5 Meter Walk Test- trial 3  14 sec.    5 meter walk test average  13.33 sec    4 Stage Balance Test Position  2    ADL/IADL Independent with:  Bathing    ADL/IADL Needs Assistance with:  Dressing;Meal prep;Yard work    ADL/IADL Therapist, sports Index  Vulnerable    6 Minute Walk- Baseline  yes    BP (mmHg)  134/62    HR (bpm)  63    02 Sat (%RA)  97 %    Modified Borg Scale for Dyspnea  0- Nothing at all    6 Minute Walk Post Test  yes    BP (mmHg)  169/73    HR (bpm)  86    02 Sat (%RA)  99 %    Modified Borg Scale for Dyspnea  2- Mild shortness of breath    Perceived Rate of Exertion (Borg)  11- Fairly light    Aerobic Endurance Distance Walked  382    Endurance additional comments  limited by vision           Clinical Impression Statement: Pt is an 82  yo male presenting to OP PT for evaluation prior to possible TAVR surgery due to severe aortic stenosis. Pt reports onset of decreased endurance  approximately 3 months ago. Symptoms are limiting ability to perform ADl's . Pt presents with decreased ROM and strength, and balance and is at high fall risk 4 stage balance test, decreased walking speed and decreased aerobic endurance per 6 minute walk test. Pt ambulated 240 feet in 3:04 before requesting a seated rest beak lasting 1:04. At time of rest, patient's HR was 88 bpm and O2  was 99 on room air. Pt reported 2/10 shortness of breath on modified scale for dyspnea. Pt able to resume after rest and ambulate an additional 142 feet. Pt ambulated a total of 381 feet in 6 minute walk. B/P increased significantly with 6 minute walk test. Based on the  Short Physical Performance Battery, patient has a frailty rating of 3/12 with </= 5/12 considered frail. Patients mobility also effected by vision. Patient needed guidance for gait.      Objective measurements completed on examination: See above findings.                                     Plan - 11/20/17 1235    Clinical Impression Statement  See below     Clinical Presentation  Stable    Clinical Decision Making  Low    PT Frequency  One time visit    PT Treatment/Interventions  Patient/family education       Patient will benefit from skilled therapeutic intervention in order to improve the following deficits and impairments:  Decreased endurance  Visit Diagnosis: Other abnormalities of gait and mobility     Problem List Patient Active Problem List   Diagnosis Date Noted  . Chest pain 09/17/2015  . Elevated troponin 08/24/2015  . H/O adenomatous polyp of colon 03/06/2015  . BPH with obstruction/lower urinary tract symptoms 01/21/2015  . Right lower quadrant abdominal pain 01/21/2015  . Glaucoma 12/08/2014  . Type 2 diabetes mellitus ([place]) 12/08/2014  . Insomnia, persistent 12/16/2013  . Peripheral arterial occlusive disease ([place]) 12/16/2013  . Benign fibroma of prostate 12/14/2013  . H/O respiratory system disease 12/14/2013  . Cerebrovascular accident, old 12/14/2013  . HLD (hyperlipidemia) 12/14/2013  . Adult hypothyroidism 12/14/2013  . Arthritis, degenerative 12/14/2013  . Adiposity 12/14/2013  . Peripheral nerve disease 12/14/2013  . Severe aortic stenosis 08/13/2013  . 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 Difficulty hearing 08/12/2013  . BP (high blood pressure) 08/12/2013    Carney Living 11/20/2017, 12:37 PM  [facility] [address] [place], [place], [address] Phone: [phone]   Fax:  [phone]  Name: Chad Rollins MRN:  [id number] Date of Birth: Aug 08, 1935

## 2017-11-20 NOTE — Progress Notes (Signed)
Bilateral carotid duplex completed. Preliminary results - Right no evidence of ICA stenosis. Left 1% to 39% ICA stenosis. Bilateral vertebral artery flow is antegrade. [facility], [place] 11/20/2017 4:44 PM

## 2017-11-22 ENCOUNTER — Other Ambulatory Visit: Payer: Self-pay

## 2017-11-22 ENCOUNTER — Institutional Professional Consult (permissible substitution): Payer: PPO | Admitting: Surgery

## 2017-11-22 ENCOUNTER — Encounter: Payer: Self-pay | Admitting: Surgery

## 2017-11-22 VITALS — BP 140/66 | HR 60 | Resp 18 | Ht 70.0 in | Wt 203.4 lb

## 2017-11-22 DIAGNOSIS — I350 Nonrheumatic aortic (valve) stenosis: Secondary | ICD-10-CM

## 2017-11-22 NOTE — Progress Notes (Signed)
Patient ID: Chad Rollins, male   DOB: [birth year], 82 y.o.   MRN: [id number]  [place] [facility] CONSULTATION REPORT  Referring Provider is Fath, Javier Docker, MD PCP is Doy Hutching Leonie Douglas, MD  Chief Complaint  Patient presents with   Aortic Stenosis    1st TAVR consultation    HPI:  The patient is an 82 year old gentleman with a history of diabetes, hypertension, hyperlipidemia, hypothyroidism, coronary artery disease status post recent PCI with a DES to the right coronary artery, rheumatoid arthritis, peripheral neuropathy, prior stroke with significant visual defect and legally blind who has a known history of aortic stenosis has been followed with serial echocardiograms.  He had an echo in March 2017 which showed severe aortic stenosis with a mean gradient of 39 mmHg and a peak gradient of 60 mmHg.  The dimensionless index at that time was 0.15 with a valve area measured at 0.43 cm.  Left ventricular ejection fraction at that time was 60 to 65%.  He had an echocardiogram repeated recently which showed the peak velocity across aortic valve to be approaching 5 m/s with a mean gradient greater than 50 mmHg.  There is mild AI and mild MR. Left ventricular systolic function was preserved from ejection fraction of greater than 55%.  Cardiac catheterization was performed on 11/06/2017 and showed severe two-vessel coronary disease with a large dominant right coronary artery that had 99% proximal to mid stenosis.  There is a small marginal branch with 99% stenosis.  The RCA lesion was treated with a DES successfully and the patient has been maintained on dual antiplatelet therapy.  The aortic valve mean gradient was 54.4 mmHg and a peak gradient was 57 mmHg.  Valve area was calculated at 0.83 cm.  Cardiac index was 2.99.  PA pressure was 49/20 with an LVEDP of 19.  The patient is here with his son today.  He has a daughter who is very  involved in his care but could not be here today.  He has been widowed for the past 6 years.  He lives alone and his son lives nearby.  He is very limited in his activity due to poor eyesight.  He can walk around his house using a cane but if he gets outside he has to be holding onto someone's shoulder.  The patient denies any shortness of breath or chest pain.  He has had no orthopnea, PND, or leg swelling.  He denies any dizziness or syncope.  He does admit to being fatigued and his son feels like he is not very active because he does not have the energy to do anything.  His son does feel like he has perked up some since he had his PCI.  He had a stroke in the past with some numbness and tingling around the right side of his mouth with facial droop and some speech difficulty that completely resolved.  He also had a significant visual field cut in his left eye and has been legally blind since.  He has some hired help at home during the week.  Past Medical History:  Diagnosis Date   Allergic rhinitis    Aortic stenosis    BPH with obstruction/lower urinary tract symptoms    CAD (coronary artery disease)    11/06/17 PCI/DES x1 to mRCA.   Collagen vascular disease (HCC)    Rheumatoid Arthritis   Continuous right lower quadrant pain    Diabetes mellitus (  [place])    HLD (hyperlipidemia)    HTN (hypertension)    Hypothyroidism    Injury of right rotator cuff    Obesity    Osteoarthritis    Peripheral neuropathy    Rheumatoid arthritis ([place])    Stroke [facility])     Past Surgical History:  Procedure Laterality Date   CHOLECYSTECTOMY     CORONARY STENT INTERVENTION N/A 11/06/2017   Procedure: CORONARY STENT INTERVENTION;  Surgeon: Sherren Mocha, MD;  Location: [facility];  Service: Cardiovascular;  Laterality: N/A;   Lipoma excisions     RIGHT/LEFT HEART CATH AND CORONARY ANGIOGRAPHY N/A 11/06/2017   Procedure: RIGHT/LEFT HEART CATH AND CORONARY ANGIOGRAPHY;  Surgeon: Sherren Mocha, MD;  Location: [facility];  Service: Cardiovascular;  Laterality: N/A;   Surgery for sleep apnea     TONSILLECTOMY      Family History  Problem Relation Age of Onset   Diabetes Mother    Hypertension Mother    Prostate cancer Neg Hx    Bladder Cancer Neg Hx    Kidney disease Neg Hx     Social History   Socioeconomic History   Marital status: Widowed    Spouse name: Not on file   Number of children: Not on file   Years of education: Not on file   Highest education level: Not on file  Occupational History   Occupation: retired  Scientist, product/process development strain: Not on file   Food insecurity:    Worry: Not on file    Inability: Not on file   Transportation needs:    Medical: Not on file    Non-medical: Not on file  Tobacco Use   Smoking status: Former Smoker    Last attempt to quit: 10/14/1973    Years since quitting: 44.1   Smokeless tobacco: Former Systems developer   Tobacco comment: quit 1975  Substance and Sexual Activity   Alcohol use: No    Alcohol/week: 0.0 oz   Drug use: No   Sexual activity: Not on file  Lifestyle   Physical activity:    Days per week: Not on file    Minutes per session: Not on file   Stress: Not on file  Relationships   Social connections:    Talks on phone: Not on file    Gets together: Not on file    Attends religious service: Not on file    Active member of club or organization: Not on file    Attends meetings of clubs or organizations: Not on file    Relationship status: Not on file   Intimate partner violence:    Fear of current or ex partner: Not on file    Emotionally abused: Not on file    Physically abused: Not on file    Forced sexual activity: Not on file  Other Topics Concern   Not on file  Social History Narrative   Not on file    Current Outpatient Medications  Medication Sig Dispense Refill   aspirin EC 81 MG tablet Take 81 mg by mouth daily.     atorvastatin (LIPITOR)  20 MG tablet Take 20 mg by mouth at bedtime.      brimonidine (ALPHAGAN) 0.2 % ophthalmic solution Place 1 drop into the left eye 2 (two) times daily.     busPIRone (BUSPAR) 15 MG tablet Take 15 mg by mouth 3 (three) times daily.      Cholecalciferol (VITAMIN D3) 2000 units  capsule Take 2,000 Units by mouth 2 (two) times daily.      clopidogrel (PLAVIX) 75 MG tablet Take 75 mg by mouth at bedtime.      dorzolamide-timolol (COSOPT) 22.3-6.8 MG/ML ophthalmic solution Place 1 drop into both eyes 2 (two) times daily.     finasteride (PROSCAR) 5 MG tablet Take 1 tablet (5 mg total) by mouth daily. 30 tablet 12   furosemide (LASIX) 20 MG tablet Take 20 mg by mouth daily.     glipiZIDE (GLUCOTROL) 5 MG tablet Take 5 mg by mouth 2 (two) times daily before a meal.      latanoprost (XALATAN) 0.005 % ophthalmic solution Place 1 drop into both eyes at bedtime.      levothyroxine (SYNTHROID, LEVOTHROID) 75 MCG tablet Take 75 mcg by mouth daily before breakfast.     losartan (COZAAR) 50 MG tablet Take 50 mg by mouth 2 (two) times daily.      Melatonin 5 MG TABS Take 5 mg by mouth at bedtime.      metFORMIN (GLUCOPHAGE) 1000 MG tablet Take 1,000 mg by mouth 2 (two) times daily with a meal.     metoprolol tartrate (LOPRESSOR) 25 MG tablet Take 25 mg by mouth 2 (two) times daily.      Multiple Vitamins-Minerals (PRESERVISION AREDS 2) CAPS Take 1 capsule by mouth 2 (two) times daily.     nitroGLYCERIN (NITROSTAT) 0.4 MG SL tablet Place 1 tablet (0.4 mg total) under the tongue every 5 (five) minutes as needed. 25 tablet 2   nystatin (MYCOSTATIN) powder Apply 1 g topically 2 (two) times daily as needed (rash under arms).      pantoprazole (PROTONIX) 40 MG tablet Take 1 tablet (40 mg total) by mouth daily. 30 tablet 2   tamsulosin (FLOMAX) 0.4 MG CAPS capsule Take 1 capsule (0.4 mg total) by mouth daily after supper. 30 capsule 12   vitamin B-12 (CYANOCOBALAMIN) 1000 MCG tablet Take 1,000 mcg by  mouth at bedtime.     No current facility-administered medications for this visit.     Allergies  Allergen Reactions   Acetaminophen Other (See Comments)    Long period of time, effects kidney      Review of Systems:   General:  normal appetite, decreased energy, no weight gain, no weight loss, no fever  Cardiac:  no chest pain with exertion, no chest pain at rest, noSOB with exertion, no resting SOB, no PND, no orthopnea, no palpitations, no arrhythmia, no atrial fibrillation, no LE edema, no dizzy spells, no syncope  Respiratory:  no shortness of breath, no home oxygen, no productive cough, no dry cough, no bronchitis, no wheezing, no hemoptysis, no asthma, no pain with inspiration or cough, no sleep apnea, no CPAP at night  GI:   no difficulty swallowing, no reflux, no frequent heartburn, no hiatal hernia, no abdominal pain, no constipation, no diarrhea, no hematochezia, no hematemesis, no melena  GU:   no dysuria,  no frequency, no urinary tract infection, no hematuria, no enlarged prostate, no kidney stones, no kidney disease  Vascular:  no pain suggestive of claudication, no pain in feet, no leg cramps, no varicose veins, no DVT, no non-healing foot ulcer  Neuro:   + remote stroke, no TIA's, no seizures, no headaches, + blindness left eye,  no slurred speech, no peripheral neuropathy, no chronic pain, no instability of gait, no memory/cognitive dysfunction  Musculoskeletal: no arthritis, no joint swelling, no myalgias, no difficulty walking, reduced mobility due  to sight.  Skin:   no rash, no itching, no skin infections, no pressure sores or ulcerations  Psych:   no anxiety, no depression, no nervousness, no unusual recent stress  Eyes:   + blurry vision, no floaters, no recent vision changes, does not wears glasses or contacts  ENT:   + hearing loss, no loose or painful teeth, no dentures, last saw dentist 10/16/2017  Hematologic:  no easy bruising, no abnormal bleeding, no clotting  disorder, no frequent epistaxis  Endocrine:  + diabetes, does  check CBG's at home     Physical Exam:   BP 140/66 (BP Location: Left Arm, Patient Position: Sitting, Cuff Size: Normal)    Pulse 60    Resp 18    Ht _0  (1.778 m)    Wt 203 lb 6.4 oz (92.3 kg)    SpO2 99% Comment: RA   BMI 29.18 kg/m   General:  Elderly, somewhat frail-appearing  HEENT:  Unremarkable, NCAT, PERLA, EOMI, oropharynx clear, teeth in fair condition.  Neck:   no JVD, no bruits, no adenopathy or thyromegaly  Chest:   clear to auscultation, symmetrical breath sounds, no wheezes, no rhonchi   CV:   RRR, grade III/VI crescendo/decrescendo murmur heard best at RSB, no diastolic murmur.  Abdomen:  soft, non-tender, no masses or organomegaly  Extremities:  warm, well-perfused, pulses palpable in feet, no LE edema  Rectal/GU  Deferred  Neuro:   Grossly non-focal and symmetrical throughout  Skin:   Clean and dry, no rashes, no breakdown   Diagnostic Tests:  2D Echo: ECHOCARDIOGRAPHIC MEASUREMENTS 2D DIMENSIONS AORTAValues Normal Range MAIN PA ValuesNormal Range Annulus: nm*[2.3-2.9] PA Main: nm* [1.5-2.1] Aorta Sin: 3.0 cm [3.1-3.7]RIGHT VENTRICLE ST Junction: nm*[2.6-3.2] RV Base: nm* [<4.2] Asc.Aorta: nm*[2.6-3.4]RV Mid: nm* [<3.5] LEFT VENTRICLERV Length: nm* [<8.6] LVIDd: 5.3 cm [4.2-5.9]INFERIOR VENA CAVA LVIDs: 3.9 cmMax. IVC: nm* [<=2.1]  FS: 25.9 % [>25]Min. IVC: nm* SWT: 1.3 cm [0.6-1.0]------------------ PWT: 1.3 cm [0.6-1.0]nm* - not measured LEFT ATRIUM LA Diam: 4.0 cm [3.0-4.0] LA A4C Area:  nm*[<20] LA Volume: BO*[17-51] _________________________________________________________________________________________ ECHOCARDIOGRAPHIC DESCRIPTIONS AORTIC ROOT  Size: Normal  Dissection: No dissection AORTIC VALVE  Leaflets: Tricuspid Morphology: MODERATELY THICKENED  Mobility: PARTIALLY MOBILE LEFT VENTRICLE  Size: NormalAnterior: Normal Contraction: Normal Lateral: Normal  Closest EF: >55% (Estimated)Septal: Normal LV Masses: No Masses Apical: Normal LVH: MILD LVHInferior: Normal Posterior: Normal  Dias.FxClass: (Grade 2) relaxation abnormal, pseudonormal MITRAL VALVE  Leaflets: NormalMobility: Fully mobile  Morphology: Normal LEFT ATRIUM  Size: Normal LA Masses: No masses IA Septum: Normal IAS MAIN PA  Size: Normal PULMONIC VALVE  Morphology: NormalMobility: Fully mobile RIGHT VENTRICLE RV Masses: No Masses Size: Normal Free Wall: Normal Contraction: Normal TRICUSPID VALVE  Leaflets: NormalMobility: Fully mobile  Morphology: Normal RIGHT ATRIUM  Size: NormalRA Other: None RA Mass: No masses PERICARDIUM Fluid: No effusion INFERIOR VENACAVA  Size: Normal Normal respiratory collapse _________________________________________________________________________________________  DOPPLER ECHO and OTHER SPECIAL  PROCEDURES  Aortic: MILD ARSEVERE AS  483.7 cm/sec peak vel93.6 mmHg peak grad  51.8 mmHg mean grad0.43 cm^2 by DOPPLER  Mitral: MILD MRNo MS  MV Inflow E Vel = 106.0 cm/secMV Annulus E'Vel = 5.2 cm/sec  E/E'Ratio = 20.4 Tricuspid: TRIVIAL TR No TS  148.3 cm/sec peak TR vel 13.8 mmHg peak RV pressure Pulmonary: TRIVIAL PR No PS _________________________________________________________________________________________ INTERPRETATION NORMAL LEFT VENTRICULAR SYSTOLIC FUNCTION WITH MILD LVH NORMAL RIGHT VENTRICULAR SYSTOLIC FUNCTION MILD VALVULAR REGURGITATION (See above) SEVERE VALVULAR STENOSIS (See above) Morphology: MODERATELY THICKENED Mobility: PARTIALLY MOBILE  Closest EF: >55% (Estimated) LVH: MILD LVH AVS: SEVERE AS Aortic: MILD AR Mitral: MILD MR Tricuspid: TRIVIAL TR     Physicians   Panel Physicians Referring Physician Case Authorizing Physician  Sherren Mocha, MD (Primary)    Procedures   CORONARY STENT INTERVENTION  RIGHT/LEFT HEART CATH AND CORONARY ANGIOGRAPHY  Conclusion   1. Severe 2 vessel CAD with severe stenosis of a large, dominant RCA and severe stenosis of a small OM branch 2. Severe aortic stenosis with mean gradient 54 mmHg and calculated AVA 0.83  Recommend: DAPT with ASA and clopidogrel x 6 months. Continue multidisciplinary evaluation for TAVR Medical Rx for residual CAD  Indications   Severe aortic stenosis [I35.0 (ICD-10-CM)]  Procedural Details/Technique   Technical Details INDICATION: Severe aortic stenosis - Pre-TAVR  PROCEDURAL DETAILS: There was an indwelling IV in a right antecubital vein. Using normal sterile technique, the IV was changed out for a 5 Fr brachial sheath over a  0.018 inch wire. The right wrist was then prepped, draped, and anesthetized with 1% lidocaine. Using the modified Seldinger technique a 5/6 French Slender sheath was placed in the right radial artery. Intra-arterial verapamil was administered through the radial artery sheath. IV heparin was administered after a JR4 catheter was advanced into the central aorta. A Swan-Ganz catheter was used for the right heart catheterization. Standard protocol was followed for recording of right heart pressures and sampling of oxygen saturations. Fick cardiac output was calculated. Standard Judkins catheters were used for selective coronary angiography. PCI is performed after the diagnostic procedure. There were no immediate procedural complications. The patient was transferred to the post catheterization recovery area for further monitoring.     Estimated blood loss <50 mL.  During this procedure the patient was administered the following to achieve and maintain moderate conscious sedation: Versed 1 mg, Fentanyl 25 mcg, while the patient's heart rate, blood pressure, and oxygen saturation were continuously monitored.  Coronary Findings   Diagnostic  Dominance: Right  Left Anterior Descending  There is mild diffuse disease throughout the vessel.  Left Circumflex  There is mild diffuse disease throughout the vessel.  First Obtuse Marginal Branch  Vessel is small in size.  Ost 1st Mrg to 1st Mrg lesion 99% stenosed  Ost 1st Mrg to 1st Mrg lesion is 99% stenosed. small vessel, subtotal occlusion, fills late, not suitable for PCI or grafting because of small vessel size  Right Coronary Artery  Prox RCA to Mid RCA lesion 90% stenosed  Prox RCA to Mid RCA lesion is 90% stenosed. diffuse mid-segmental disease  Intervention   Prox RCA to Mid RCA lesion  Stent  Lesion crossed with guidewire using a WIRE COUGAR XT STRL 190CM. Pre-stent angioplasty was performed using a BALLOON SAPPHIRE 2.5X15. A drug-eluting stent  was successfully placed using a STENT SYNERGY DES 3.5X38. Post-stent angioplasty was performed using a BALLOON SAPPHIRE  [id number]. Maximum pressure: 18 atm. A JR4 guide catheter is used. Heparin is used for anticoagulation and a therapeutic ACT is achieved (307). There is TIMI-3 flow pre-and post PCI.  Post-Intervention Lesion Assessment  The intervention was successful. Pre-interventional TIMI flow is 3. Post-intervention TIMI flow is 3. No complications occurred at this lesion.  There is no residual stenosis post intervention.  Coronary Diagrams   Diagnostic Diagram       Post-Intervention Diagram       Implants    Permanent Stent  Stent Synergy Des 3.5x38 - [id number] - Implanted    Inventory item: STENT SYNERGY  DES 3.5X38 Model/Cat number: [id number]  Manufacturer: [employer] Lot number: [id number]  Device identifier: [id number] Device identifier type: GS1  Area Of Implantation: Mid RCA    GUDID Information   Request status Successful    Brand name: SYNERGY Version/Model: [id number]  Company name: [employer] MRI safety info as of 11/06/17: MR Conditional  Contains dry or latex rubber: No    GMDN P.T. name: Drug-eluting coronary artery stent, bioabsorbable-polymer-coated    As of 11/06/2017   Status: Implanted      MERGE Images   Show images for CARDIAC CATHETERIZATION   Link to Procedure Log   Procedure Log    Hemo Data    Most Recent Value  Fick Cardiac Output 6.39 L/min  Fick Cardiac Output Index 2.99 (L/min)/BSA  Aortic Mean Gradient 54.4 mmHg  Aortic Peak Gradient 57 mmHg  Aortic Valve Area 0.83  Aortic Value Area Index 0.39 cm2/BSA  RA A Wave 12 mmHg  RA V Wave 10 mmHg  RA Mean 9 mmHg  RV Systolic Pressure 49 mmHg  RV Diastolic Pressure 6 mmHg  RV EDP 12 mmHg  PA Systolic Pressure 49 mmHg  PA Diastolic Pressure 20 mmHg  PA Mean 31 mmHg  PW A Wave 21 mmHg  PW V Wave 25 mmHg  PW Mean 19 mmHg  AO Systolic  Pressure 035 mmHg  AO Diastolic Pressure 57 mmHg  AO Mean 87 mmHg  LV Systolic Pressure 009 mmHg  LV Diastolic Pressure 9 mmHg  LV EDP 19 mmHg  Arterial Occlusion Pressure Extended Systolic Pressure 381 mmHg  Arterial Occlusion Pressure Extended Diastolic Pressure 62 mmHg  Arterial Occlusion Pressure Extended Mean Pressure 97 mmHg  Left Ventricular Apex Extended Systolic Pressure 829 mmHg  Left Ventricular Apex Extended Diastolic Pressure 10 mmHg  Left Ventricular Apex Extended EDP Pressure 31 mmHg  QP/QS 1  TPVR Index 10.39 HRUI  TSVR Index 29.14 HRUI  PVR SVR Ratio 0.15  TPVR/TSVR Ratio 0.36    ADDENDUM REPORT: 11/20/2017 17:09  CLINICAL DATA:  82 year old male with severe aortic stenosis being evaluated for a TAVR procedure.  EXAM: Cardiac TAVR CT  TECHNIQUE: The patient was scanned on a [employer]. A 120 kV retrospective scan was triggered in the descending thoracic aorta at 111 HU's. Gantry rotation speed was 250 msecs and collimation was .6 mm. No beta blockade or nitro were given. The 3D data set was reconstructed in 5% intervals of the R-R cycle. Systolic and diastolic phases were analyzed on a dedicated work station using MPR, MIP and VRT modes. The patient received 80 cc of contrast.  FINDINGS: Aortic Valve: Trileaflet aortic valve with severely thickened and calcified leaflets and severely restricted leaflet openings. There are no calcifications extending into the LVOT.  Aorta: Normal size, mild diffuse calcifications, no dissection.  Sinotubular Junction: 26 x 25 mm  Ascending Thoracic Aorta: 30 x 29 mm  Aortic Arch: 23 x 21 mm  Descending Thoracic Aorta: 24 x 22 mm  Sinus of Valsalva Measurements:  Non-coronary: 29 mm  Right -coronary: 31 mm  Left -coronary: 32 mm  Coronary Artery Height above Annulus:  Left Main: 12 mm  Right Coronary: 20 mm  Virtual Basal Annulus Measurements:  Maximum/Minimum Diameter:  27.6 x 24.2 mm  Mean Diameter: 25.2 mm  Perimeter: 80.5 mm  Area: 500 mm2  Optimum Fluoroscopic Angle for Delivery:  LAO 10 CAU 10  IMPRESSION: 1. Trileaflet aortic valve with severely thickened and calcified leaflets and severely restricted  leaflet openings. Annular measurements suitable for delivery of a 26 mm Edwards-SAPIEN 3 valve.  2. Sufficient coronary to annulus distance.  3. Optimum Fluoroscopic Angle for Delivery: LAO 10 CAU 10  4. No thrombus in the left atrial appendage.   Electronically Signed   By: Ena Dawley   On: 11/20/2017 17:09   Addended by Dorothy Spark, MD on 11/20/2017 5:12 PM    Study Result   EXAM: OVER-READ INTERPRETATION  CT CHEST  The following report is an over-read performed by radiologist Dr. Vinnie Langton of [facility], [place] on 11/20/2017. This over-read does not include interpretation of cardiac or coronary anatomy or pathology. The coronary calcium score/coronary CTA interpretation by the cardiologist is attached.  COMPARISON:  Chest CT 03/28/2013.  FINDINGS: Extracardiac findings will be described separately under dictation for contemporaneously obtained CTA chest, abdomen and pelvis dated 11/20/2017.  IMPRESSION: Please see separate dictation for contemporaneously obtained CTA chest, abdomen and pelvis 11/20/2017 for full description of relevant extracardiac findings.  Electronically Signed: By: Vinnie Langton M.D. On: 11/20/2017 16:56       CT ANGIO ABDOMEN PELVIS W &/OR WO CONTRAST (Accession [id number]) (Order [id number])  Imaging  Date: 11/20/2017 Department: [facility] CT IMAGING Released By: Ermalinda Barrios Authorizing: Sherren Mocha, MD  Exam Information   Status Exam Begun  Exam Ended   Final [99] 11/20/2017 3:57 PM 11/20/2017 4:29 PM  Study Result   CLINICAL DATA:  82 year old male with history of severe aortic stenosis. Preprocedural study prior  to potential transcatheter aortic valve replacement (TAVR) procedure.  EXAM: CT ANGIOGRAPHY CHEST, ABDOMEN AND PELVIS  TECHNIQUE: Multidetector CT imaging through the chest, abdomen and pelvis was performed using the standard protocol during bolus administration of intravenous contrast. Multiplanar reconstructed images and MIPs were obtained and reviewed to evaluate the vascular anatomy.  CONTRAST:  106m ISOVUE-370 IOPAMIDOL (ISOVUE-370) INJECTION 76%  COMPARISON:  Chest CT 03/28/2013. CT the abdomen and pelvis 07/17/2014.  FINDINGS: CTA CHEST FINDINGS  Cardiovascular: Heart size is normal. There is no significant pericardial fluid, thickening or pericardial calcification. There is aortic atherosclerosis, as well as atherosclerosis of the great vessels of the mediastinum and the coronary arteries, including calcified atherosclerotic plaque in the left main, left anterior descending and right coronary arteries. Severe calcifications and thickening of the aortic valve. Lipomatous hypertrophy of the interatrial septum (normal anatomical variant) is incidentally noted.  Mediastinum/Lymph Nodes: Several prominent borderline enlarged mediastinal and hilar lymph nodes are noted measuring up to 1.4 cm in short axis in the right hilar nodal station. Esophagus is unremarkable in appearance. No axillary lymphadenopathy.  Lungs/Pleura: Several tiny 2-3 mm pulmonary nodules are noted in the lungs bilaterally, nonspecific, but statistically likely benign. No larger more suspicious appearing pulmonary nodules or masses are noted. Patchy areas of ground-glass attenuation and mild interlobular septal thickening are noted, most evident throughout the mid to lower lungs, favored to reflect a background of mild interstitial pulmonary edema. No consolidative airspace disease. No pleural effusions.  Musculoskeletal/Soft Tissues: There are no aggressive appearing lytic or blastic  lesions noted in the visualized portions of the skeleton.  CTA ABDOMEN AND PELVIS FINDINGS  Hepatobiliary: In the central aspect of segment 7 of the liver (axial image 79 of series 14) there is a 1.9 x 1.7 cm lesion which is centrally low-attenuation in demonstrates some peripheral nodular enhancement, stable compared to prior study from 2016, most compatible with a cavernous hemangioma. No other suspicious appearing cystic or solid hepatic lesions  are noted. No intra or extrahepatic biliary ductal dilatation. Status post cholecystectomy.  Pancreas: No pancreatic mass. No pancreatic ductal dilatation. No pancreatic or peripancreatic fluid or inflammatory changes.  Spleen: Large splenule adjacent to the splenic hilum.  Adrenals/Urinary Tract: Subcentimeter low-attenuation lesion in the lower pole of the right kidney his too small to characterize, but similar to prior study, statistically likely a cyst. Mild atrophy of both kidneys. No hydroureteronephrosis. Urinary bladder is normal in appearance. Bilateral adrenal glands are normal in appearance.  Stomach/Bowel: Normal appearance of the stomach. No pathologic dilatation of small bowel or colon. Numerous colonic diverticulae are noted, without surrounding inflammatory changes to suggest an acute diverticulitis at this time. Normal appendix.  Vascular/Lymphatic: Extensive aortic atherosclerosis without evidence of aneurysm or dissection in the abdominal or pelvic vasculature. Vascular findings and measurements pertinent to potential TAVR procedure, as detailed below. Celiac axis, superior mesenteric artery and inferior mesenteric artery are all widely patent without hemodynamically significant stenosis. Mild stenosis of the dominant left renal artery. There is also a small accessory branch to the lower pole which appears patent. Single right renal artery is widely patent without hemodynamically significant stenosis. No  lymphadenopathy noted in the abdomen or pelvis.  Reproductive: Prostate gland and seminal vesicles are unremarkable in appearance.  Other: No significant volume of ascites.  No pneumoperitoneum.  Musculoskeletal: There are no aggressive appearing lytic or blastic lesions noted in the visualized portions of the skeleton.  VASCULAR MEASUREMENTS PERTINENT TO TAVR:  AORTA:  Minimal Aortic Diameter-10 x 9 mm  Severity of Aortic Calcification-severe  RIGHT PELVIS:  Right Common Iliac Artery -  Minimal Diameter-9.5 x 6.8 mm  Tortuosity-mild  Calcification-moderate  Right External Iliac Artery -  Minimal Diameter-5.8 x 4.3 mm  Tortuosity-severe  Calcification-mild  Right Common Femoral Artery -  Minimal Diameter-6.7 x 5.1 mm  Tortuosity-mild  Calcification-moderate  LEFT PELVIS:  Left Common Iliac Artery -  Minimal Diameter-7.3 x 6.5 mm  Tortuosity-mild  Calcification-moderate  Left External Iliac Artery -  Minimal Diameter-6.1 x 4.0 mm  Tortuosity-moderate  Calcification-mild  Left Common Femoral Artery -  Minimal Diameter-5.9 x 5.8 mm  Tortuosity-mild  Calcification-moderate  Review of the MIP images confirms the above findings.  IMPRESSION: 1. Vascular findings and measurements pertinent to potential TAVR procedure, as detailed above. 2. Severe thickening calcification of the aortic valve, compatible with the reported clinical history of severe aortic stenosis. 3. Findings in the lungs concerning for mild interstitial pulmonary edema. 4. Multiple prominent borderline enlarged mediastinal and hilar lymph nodes. These are nonspecific, but in the setting of possible pulmonary edema are favored to be benign. Further clinical evaluation to exclude lymphoproliferative disorder should be considered. 5. Aortic atherosclerosis, in addition to left main and 3 vessel coronary artery disease. 6. Colonic  diverticulosis without evidence of acute diverticulitis at this time. 7. Cavernous hemangioma in segment 7 of the liver again noted, as above. 8. Status post cholecystectomy. 9. Additional incidental findings, as above.  Aortic Atherosclerosis (ICD10-I70.0).   Electronically Signed   By: Vinnie Langton M.D.   On: 11/21/2017 08:57      Physical therapy evaluation:  Clinical Impression Statement: Pt is an 82  yo male presenting to OP PT for evaluation prior to possible TAVR surgery due to severe aortic stenosis. Pt reports onset of decreased endurance  approximately 3 months ago. Symptoms are limiting ability to perform ADl's . Pt presents with decreased ROM and strength, and balance and is at high fall risk 4 stage  balance test, decreased walking speed and decreased aerobic endurance per 6 minute walk test. Pt ambulated 240 feet in 3:04 before requesting a seated rest beak lasting 1:04. At time of rest, patients HR was 88 bpm and O2 was 99 on room air. Pt reported 2/10 shortness of breath on modified scale for dyspnea. Pt able to resume after rest and ambulate an additional 142 feet. Pt ambulated a total of 381 feet in 6 minute walk. B/P increased significantly with 6 minute walk test. Based on the Short Physical Performance Battery, patient has a frailty rating of 3/12 with </= 5/12 considered frail. Patients mobility also effected by vision. Patient needed guidance for gait.     Impression:  This 82 year old gentleman has what I feel is stage D, severe, symptomatic aortic stenosis with New York Heart Association class II symptoms of exertional fatigue and shortness of breath consistent with chronic diastolic congestive heart failure.  He and his son have noted the exertional fatigue but he denies any shortness of breath.  During his physical therapy evaluation he developed shortness of breath and fatigue after ambulating 240 feet in 3 minutes and 4 seconds and had to take a  sitting rest break before completing 381 total feet in 6 minutes.  I think he probably does not notice any shortness of breath at home because he is mostly ambulating short distances around in his house and if he gets outside walking he is going very slowly and holding onto someone's shoulder due to his vision loss.  I have personally reviewed his 2D echocardiogram done at [facility], cardiac catheterization, and CTA studies.  His echocardiogram shows a trileaflet aortic valve with severe calcification and thickening and markedly restricted leaflet mobility.  The peak velocity is approaching 5 m/s consistent with severe aortic stenosis.  His cardiac catheterization showed a mean gradient of 54 mmHg and an aortic valve area calculated 0.83 cm consistent with severe aortic stenosis.  He had a high-grade dominant RCA stenosis that was treated successfully with PCI with a drug-eluting stent on 11/06/2017 and he has been maintained on dual antiplatelet therapy since.  His son is noticed that he was a little more active since having his PCI.  I agree that aortic valve replacement is probably indicated in this patient to improve his stamina and prevent progressive left ventricular deterioration.  Although he is 82 years old and legally blind he still lives independently at home with someone coming in during the day to help him take care of his house.  I do not think he would be a candidate for open surgical AVR due to the significant risk of morbidity and mortality due to his age, arthritis, and vision loss which would make recovery difficult.  I think transcatheter aortic valve replacement would be the best treatment for him.  His gated cardiac CTA shows anatomy that is favorable for TAVR with no complicating features.  His abdominal and pelvic CTA shows some focal narrowing of both external iliac arteries but I think there is adequate pelvic vascular access to allow transfemoral insertion.  The left axillary artery would be an  alternative access site as would transapical insertion.  The patient and his son were counseled at length regarding treatment alternatives for management of severe symptomatic aortic stenosis. The risks and benefits of surgical intervention has been discussed in detail. Long-term prognosis with medical therapy was discussed. Alternative approaches such as conventional surgical aortic valve replacement, transcatheter aortic valve replacement, and palliative medical therapy were compared  and contrasted at length. This discussion was placed in the context of the patient's own specific clinical presentation and past medical history. All of their questions have been addressed.  They understand that he will have to have a second surgical evaluation before a decision is made about the appropriateness of TAVR in his case.   A discussion was held regarding what types of management strategies would be attempted intraoperatively in the event of life-threatening complications, including whether or not the patient would be considered a candidate for the use of cardiopulmonary bypass and/or conversion to open sternotomy for attempted surgical intervention.  I do not think he would be a candidate for sternotomy for any complications that developed.  The patient has been advised of a variety of complications that might develop including but not limited to risks of death, stroke, paravalvular leak, aortic dissection or other major vascular complications, aortic annulus rupture, device embolization, cardiac rupture or perforation, mitral regurgitation, acute myocardial infarction, arrhythmia, heart block or bradycardia requiring permanent pacemaker placement, congestive heart failure, respiratory failure, renal failure, pneumonia, infection, other late complications related to structural valve deterioration or migration, or other complications that might ultimately cause a temporary or permanent loss of functional independence or  other long term morbidity. The patient provides full informed consent for the procedure as described and all questions were answered.    Plan:  He will return for a second surgical evaluation with Dr. Roxy Manns on 12/04/2017 and has been tentatively scheduled for transfemoral transcatheter aortic valve replacement on 12/05/2017.  I spent 60 minutes performing this consultation and > 50% of this time was spent face to face counseling and coordinating the care of this patient's severe symptomatic aortic stenosis.  Gaye Pollack, MD 11/22/2017

## 2017-11-24 ENCOUNTER — Other Ambulatory Visit: Payer: Self-pay

## 2017-11-24 DIAGNOSIS — I350 Nonrheumatic aortic (valve) stenosis: Secondary | ICD-10-CM

## 2017-11-29 DIAGNOSIS — E1142 Type 2 diabetes mellitus with diabetic polyneuropathy: Secondary | ICD-10-CM | POA: Diagnosis not present

## 2017-11-29 DIAGNOSIS — I350 Nonrheumatic aortic (valve) stenosis: Secondary | ICD-10-CM | POA: Diagnosis not present

## 2017-11-29 DIAGNOSIS — I10 Essential (primary) hypertension: Secondary | ICD-10-CM | POA: Diagnosis not present

## 2017-11-29 DIAGNOSIS — E782 Mixed hyperlipidemia: Secondary | ICD-10-CM | POA: Diagnosis not present

## 2017-11-29 DIAGNOSIS — H5711 Ocular pain, right eye: Secondary | ICD-10-CM | POA: Diagnosis not present

## 2017-12-01 NOTE — Pre-Procedure Instructions (Signed)
Chad Rollins  12/01/2017      [facility] - [place], [place] - [address] [place] [address] Phone: [phone] Fax: [phone]    Your procedure is scheduled on December 05, 2017.  Report to [facility] Admitting at 10:30 A.M.  Call this number if you have problems the morning of surgery:  [phone]   Remember:  No food or liquids after midnight.  Continue all other medications as directed by your physician except for following these medication instructions before surgery.    Take these medicines the morning of surgery with A SIP OF WATER : Nothing  STOP your Metformin on December 03, 2017 as directed by your surgeon.  7 days prior to surgery STOP taking any Aspirin (unless otherwise instructed by your surgeon), Aleve, Naproxen, Ibuprofen, Motrin, Advil, Goody's, BC's, all herbal medications, fish oil, and all vitamins.     How to Manage Your Diabetes Before and After Surgery  Why is it important to control my blood sugar before and after surgery? . Improving blood sugar levels before and after surgery helps healing and can limit problems. . A way of improving blood sugar control is eating a healthy diet by: o  Eating less sugar and carbohydrates o  Increasing activity/exercise o  Talking with your doctor about reaching your blood sugar goals . High blood sugars (greater than 180 mg/dL) can raise your risk of infections and slow your recovery, so you will need to focus on controlling your diabetes during the weeks before surgery. . Make sure that the doctor who takes care of your diabetes knows about your planned surgery including the date and location.  How do I manage my blood sugar before surgery? . Check your blood sugar at least 4 times a day, starting 2 days before surgery, to make sure that the level is not too high or low. o Check your blood sugar the morning of your surgery when you wake up and every 2 hours until you get to the  Short Stay unit. . If your blood sugar is less than 70 mg/dL, you will need to treat for low blood sugar: o Do not take insulin. o Treat a low blood sugar (less than 70 mg/dL) with  cup of clear juice (cranberry or apple), 4 glucose tablets, OR glucose gel. Recheck blood sugar in 15 minutes after treatment (to make sure it is greater than 70 mg/dL). If your blood sugar is not greater than 70 mg/dL on recheck, call [phone] o  for further instructions. . Report your blood sugar to the short stay nurse when you get to Short Stay.  . If you are admitted to the hospital after surgery: o Your blood sugar will be checked by the staff and you will probably be given insulin after surgery (instead of oral diabetes medicines) to make sure you have good blood sugar levels. o The goal for blood sugar control after surgery is 80-180 mg/dL.      WHAT DO I DO ABOUT MY DIABETES MEDICATION?   Marland Kitchen Do not take oral diabetes medicines (pills) the morning of surgery. DO NOT take METFORMIN the morning of surgery.       Do not wear jewelry.  Do not wear lotions, powders, or colognes, or deodorant.  Do not shave 48 hours prior to surgery.  Men may shave face and neck.  Do not bring valuables to the hospital.   [facility] is not responsible for any belongings or valuables.  Contacts, dentures or bridgework may not be worn into surgery.  Leave your suitcase in the car.  After surgery it may be brought to your room.  For patients admitted to the hospital, discharge time will be determined by your treatment team.  Patients discharged the day of surgery will not be allowed to drive home.   Special instructions:   [facility]- Preparing For Surgery  Before surgery, you can play an important role. Because skin is not sterile, your skin needs to be as free of germs as possible. You can reduce the number of germs on your skin by washing with CHG (chlorahexidine gluconate) Soap before surgery.  CHG is an  antiseptic cleaner which kills germs and bonds with the skin to continue killing germs even after washing.    Oral Hygiene is also important to reduce your risk of infection.  Remember - BRUSH YOUR TEETH THE MORNING OF SURGERY WITH YOUR REGULAR TOOTHPASTE  Please do not use if you have an allergy to CHG or antibacterial soaps. If your skin becomes reddened/irritated stop using the CHG.  Do not shave (including legs and underarms) for at least 48 hours prior to first CHG shower. It is OK to shave your face.  Please follow these instructions carefully.   1. Shower the NIGHT BEFORE SURGERY and the MORNING OF SURGERY with CHG.   2. If you chose to wash your hair, wash your hair first as usual with your normal shampoo.  3. After you shampoo, rinse your hair and body thoroughly to remove the shampoo.  4. Use CHG as you would any other liquid soap. You can apply CHG directly to the skin and wash gently with a scrungie or a clean washcloth.   5. Apply the CHG Soap to your body ONLY FROM THE NECK DOWN.  Do not use on open wounds or open sores. Avoid contact with your eyes, ears, mouth and genitals (private parts). Wash Face and genitals (private parts)  with your normal soap.  6. Wash thoroughly, paying special attention to the area where your surgery will be performed.  7. Thoroughly rinse your body with warm water from the neck down.  8. DO NOT shower/wash with your normal soap after using and rinsing off the CHG Soap.  9. Pat yourself dry with a CLEAN TOWEL.  10. Wear CLEAN PAJAMAS to bed the night before surgery, wear comfortable clothes the morning of surgery  11. Place CLEAN SHEETS on your bed the night of your first shower and DO NOT SLEEP WITH PETS.    Day of Surgery:  Do not apply any deodorants/lotions.  Please wear clean clothes to the hospital/surgery center.   Remember to brush your teeth WITH YOUR REGULAR TOOTHPASTE.    Please read over the following fact sheets that you  were given.

## 2017-12-04 ENCOUNTER — Encounter (HOSPITAL_COMMUNITY): Payer: Self-pay

## 2017-12-04 ENCOUNTER — Institutional Professional Consult (permissible substitution): Payer: PPO | Admitting: Thoracic Surgery (Cardiothoracic Vascular Surgery)

## 2017-12-04 ENCOUNTER — Encounter: Payer: Self-pay | Admitting: Thoracic Surgery (Cardiothoracic Vascular Surgery)

## 2017-12-04 ENCOUNTER — Encounter (HOSPITAL_COMMUNITY)
Admission: RE | Admit: 2017-12-04 | Discharge: 2017-12-04 | Disposition: A | Discharge status: Home / Self Care | Payer: PPO | Source: Ambulatory Visit | Attending: Cardiovascular Disease | Admitting: Cardiovascular Disease

## 2017-12-04 ENCOUNTER — Ambulatory Visit (HOSPITAL_COMMUNITY)
Admission: RE | Admit: 2017-12-04 | Discharge: 2017-12-04 | Disposition: A | Discharge status: Home / Self Care | Payer: PPO | Source: Ambulatory Visit | Attending: Cardiovascular Disease | Admitting: Cardiovascular Disease

## 2017-12-04 ENCOUNTER — Other Ambulatory Visit: Payer: Self-pay

## 2017-12-04 VITALS — BP 126/60 | HR 60 | Resp 16 | Ht 70.0 in | Wt 203.0 lb

## 2017-12-04 DIAGNOSIS — I5032 Chronic diastolic (congestive) heart failure: Secondary | ICD-10-CM | POA: Diagnosis not present

## 2017-12-04 DIAGNOSIS — I2510 Atherosclerotic heart disease of native coronary artery without angina pectoris: Secondary | ICD-10-CM | POA: Diagnosis not present

## 2017-12-04 DIAGNOSIS — I350 Nonrheumatic aortic (valve) stenosis: Secondary | ICD-10-CM

## 2017-12-04 DIAGNOSIS — Z8249 Family history of ischemic heart disease and other diseases of the circulatory system: Secondary | ICD-10-CM | POA: Diagnosis not present

## 2017-12-04 DIAGNOSIS — I517 Cardiomegaly: Secondary | ICD-10-CM

## 2017-12-04 DIAGNOSIS — J9811 Atelectasis: Secondary | ICD-10-CM | POA: Insufficient documentation

## 2017-12-04 DIAGNOSIS — Z79899 Other long term (current) drug therapy: Secondary | ICD-10-CM | POA: Diagnosis not present

## 2017-12-04 DIAGNOSIS — K219 Gastro-esophageal reflux disease without esophagitis: Secondary | ICD-10-CM | POA: Diagnosis not present

## 2017-12-04 DIAGNOSIS — Z7984 Long term (current) use of oral hypoglycemic drugs: Secondary | ICD-10-CM | POA: Diagnosis not present

## 2017-12-04 DIAGNOSIS — Z7982 Long term (current) use of aspirin: Secondary | ICD-10-CM | POA: Diagnosis not present

## 2017-12-04 DIAGNOSIS — I25118 Atherosclerotic heart disease of native coronary artery with other forms of angina pectoris: Secondary | ICD-10-CM | POA: Diagnosis not present

## 2017-12-04 DIAGNOSIS — E1142 Type 2 diabetes mellitus with diabetic polyneuropathy: Secondary | ICD-10-CM | POA: Diagnosis not present

## 2017-12-04 DIAGNOSIS — Z886 Allergy status to analgesic agent status: Secondary | ICD-10-CM | POA: Diagnosis not present

## 2017-12-04 DIAGNOSIS — E785 Hyperlipidemia, unspecified: Secondary | ICD-10-CM | POA: Diagnosis present

## 2017-12-04 DIAGNOSIS — N138 Other obstructive and reflux uropathy: Secondary | ICD-10-CM | POA: Diagnosis not present

## 2017-12-04 DIAGNOSIS — Z01818 Encounter for other preprocedural examination: Secondary | ICD-10-CM | POA: Insufficient documentation

## 2017-12-04 DIAGNOSIS — E039 Hypothyroidism, unspecified: Secondary | ICD-10-CM | POA: Diagnosis not present

## 2017-12-04 DIAGNOSIS — H548 Legal blindness, as defined in USA: Secondary | ICD-10-CM | POA: Diagnosis not present

## 2017-12-04 DIAGNOSIS — I447 Left bundle-branch block, unspecified: Secondary | ICD-10-CM | POA: Diagnosis not present

## 2017-12-04 DIAGNOSIS — M1990 Unspecified osteoarthritis, unspecified site: Secondary | ICD-10-CM | POA: Diagnosis not present

## 2017-12-04 DIAGNOSIS — N401 Enlarged prostate with lower urinary tract symptoms: Secondary | ICD-10-CM | POA: Diagnosis not present

## 2017-12-04 DIAGNOSIS — M069 Rheumatoid arthritis, unspecified: Secondary | ICD-10-CM | POA: Diagnosis not present

## 2017-12-04 DIAGNOSIS — Z955 Presence of coronary angioplasty implant and graft: Secondary | ICD-10-CM | POA: Diagnosis not present

## 2017-12-04 DIAGNOSIS — Z87891 Personal history of nicotine dependence: Secondary | ICD-10-CM | POA: Diagnosis not present

## 2017-12-04 DIAGNOSIS — Z954 Presence of other heart-valve replacement: Secondary | ICD-10-CM | POA: Diagnosis not present

## 2017-12-04 DIAGNOSIS — Z7902 Long term (current) use of antithrombotics/antiplatelets: Secondary | ICD-10-CM | POA: Diagnosis not present

## 2017-12-04 DIAGNOSIS — Z452 Encounter for adjustment and management of vascular access device: Secondary | ICD-10-CM | POA: Diagnosis not present

## 2017-12-04 DIAGNOSIS — Z006 Encounter for examination for normal comparison and control in clinical research program: Secondary | ICD-10-CM | POA: Diagnosis not present

## 2017-12-04 DIAGNOSIS — I110 Hypertensive heart disease with heart failure: Secondary | ICD-10-CM | POA: Diagnosis not present

## 2017-12-04 DIAGNOSIS — I38 Endocarditis, valve unspecified: Secondary | ICD-10-CM | POA: Diagnosis not present

## 2017-12-04 DIAGNOSIS — Z952 Presence of prosthetic heart valve: Secondary | ICD-10-CM | POA: Diagnosis not present

## 2017-12-04 LAB — ABO/RH: ABO/RH(D): AB NEG

## 2017-12-04 LAB — PROTIME-INR
INR: 1.11
Prothrombin Time: 14.2 seconds (ref 11.4–15.2)

## 2017-12-04 LAB — COMPREHENSIVE METABOLIC PANEL
ALBUMIN: 3.9 g/dL (ref 3.5–5.0)
ALK PHOS: 59 U/L (ref 38–126)
ALT: 18 U/L (ref 17–63)
AST: 17 U/L (ref 15–41)
Anion gap: 11 (ref 5–15)
BUN: 34 mg/dL — ABNORMAL HIGH (ref 6–20)
CALCIUM: 9 mg/dL (ref 8.9–10.3)
CHLORIDE: 107 mmol/L (ref 101–111)
CO2: 23 mmol/L (ref 22–32)
Creatinine, Ser: 1.54 mg/dL — ABNORMAL HIGH (ref 0.61–1.24)
GFR calc non Af Amer: 41 mL/min — ABNORMAL LOW (ref >60)
GFR, EST AFRICAN AMERICAN: 47 mL/min — AB (ref >60)
GLUCOSE: 202 mg/dL — AB (ref 65–99)
Potassium: 4.2 mmol/L (ref 3.5–5.1)
SODIUM: 141 mmol/L (ref 135–145)
Total Bilirubin: 0.9 mg/dL (ref 0.3–1.2)
Total Protein: 6.3 g/dL — ABNORMAL LOW (ref 6.5–8.1)

## 2017-12-04 LAB — TYPE AND SCREEN
ABO/RH(D): AB NEG
ANTIBODY SCREEN: NEGATIVE

## 2017-12-04 LAB — BLOOD GAS, ARTERIAL
Acid-Base Excess: 1.5 mmol/L (ref 0.0–2.0)
Bicarbonate: 25.1 mmol/L (ref 20.0–28.0)
DRAWN BY: 449841
FIO2: 21
O2 Saturation: 96.9 %
PATIENT TEMPERATURE: 98.6
pCO2 arterial: 36.6 mmHg (ref 32.0–48.0)
pH, Arterial: 7.451 — ABNORMAL HIGH (ref 7.350–7.450)
pO2, Arterial: 84.6 mmHg (ref 83.0–108.0)

## 2017-12-04 LAB — URINALYSIS, ROUTINE W REFLEX MICROSCOPIC
BILIRUBIN URINE: NEGATIVE
Glucose, UA: NEGATIVE mg/dL
HGB URINE DIPSTICK: NEGATIVE
KETONES UR: NEGATIVE mg/dL
Leukocytes, UA: NEGATIVE
Nitrite: NEGATIVE
PH: 5 (ref 5.0–8.0)
Protein, ur: NEGATIVE mg/dL
SPECIFIC GRAVITY, URINE: 1.008 (ref 1.005–1.030)

## 2017-12-04 LAB — BRAIN NATRIURETIC PEPTIDE: B NATRIURETIC PEPTIDE 5: 149.8 pg/mL — AB (ref 0.0–100.0)

## 2017-12-04 LAB — CBC
HCT: 39.2 % (ref 39.0–52.0)
HEMOGLOBIN: 12.9 g/dL — AB (ref 13.0–17.0)
MCH: 30.6 pg (ref 26.0–34.0)
MCHC: 32.9 g/dL (ref 30.0–36.0)
MCV: 92.9 fL (ref 78.0–100.0)
PLATELETS: 124 10*3/uL — AB (ref 150–400)
RBC: 4.22 MIL/uL (ref 4.22–5.81)
RDW: 13.4 % (ref 11.5–15.5)
WBC: 16.1 10*3/uL — ABNORMAL HIGH (ref 4.0–10.5)

## 2017-12-04 LAB — SURGICAL PCR SCREEN
MRSA, PCR: NEGATIVE
STAPHYLOCOCCUS AUREUS: NEGATIVE

## 2017-12-04 LAB — APTT: aPTT: 28 seconds (ref 24–36)

## 2017-12-04 LAB — HEMOGLOBIN A1C
Hgb A1c MFr Bld: 5.9 % — ABNORMAL HIGH (ref 4.8–5.6)
MEAN PLASMA GLUCOSE: 122.63 mg/dL

## 2017-12-04 LAB — GLUCOSE, CAPILLARY: Glucose-Capillary: 224 mg/dL — ABNORMAL HIGH (ref 65–99)

## 2017-12-04 MED ORDER — VANCOMYCIN HCL 10 G IV SOLR
1500.0000 mg | INTRAVENOUS | Status: AC
  Administered 2017-12-05: 1500 mg via INTRAVENOUS
  Filled 2017-12-04: qty 1500

## 2017-12-04 MED ORDER — DOPAMINE-DEXTROSE 3.2-5 MG/ML-% IV SOLN
0.0000 ug/kg/min | INTRAVENOUS | Status: DC
  Filled 2017-12-04: qty 250

## 2017-12-04 MED ORDER — SODIUM CHLORIDE 0.9 % IV SOLN
30.0000 ug/min | INTRAVENOUS | Status: DC
  Filled 2017-12-04: qty 2

## 2017-12-04 MED ORDER — NITROGLYCERIN IN D5W 200-5 MCG/ML-% IV SOLN
2.0000 ug/min | INTRAVENOUS | Status: DC
  Filled 2017-12-04: qty 250

## 2017-12-04 MED ORDER — SODIUM CHLORIDE 0.9 % IV SOLN
1.5000 g | INTRAVENOUS | Status: AC
  Administered 2017-12-05: 1.5 g via INTRAVENOUS
  Filled 2017-12-04: qty 1.5

## 2017-12-04 MED ORDER — DEXTROSE 5 % IV SOLN
0.0000 ug/min | INTRAVENOUS | Status: DC
  Filled 2017-12-04: qty 4

## 2017-12-04 MED ORDER — SODIUM CHLORIDE 0.9 % IV SOLN
INTRAVENOUS | Status: DC
  Filled 2017-12-04: qty 30

## 2017-12-04 MED ORDER — DEXMEDETOMIDINE HCL IN NACL 400 MCG/100ML IV SOLN
0.1000 ug/kg/h | INTRAVENOUS | Status: AC
  Administered 2017-12-05: 1 ug/kg/h via INTRAVENOUS
  Filled 2017-12-04: qty 100

## 2017-12-04 MED ORDER — POTASSIUM CHLORIDE 2 MEQ/ML IV SOLN
80.0000 meq | INTRAVENOUS | Status: DC
  Filled 2017-12-04: qty 40

## 2017-12-04 MED ORDER — SODIUM CHLORIDE 0.9 % IV SOLN
INTRAVENOUS | Status: DC
  Filled 2017-12-04: qty 1

## 2017-12-04 MED ORDER — MAGNESIUM SULFATE 50 % IJ SOLN
40.0000 meq | INTRAMUSCULAR | Status: DC
  Filled 2017-12-04: qty 9.85

## 2017-12-04 MED ORDER — NOREPINEPHRINE 4 MG/250ML-% IV SOLN
0.0000 ug/min | INTRAVENOUS | Status: DC
  Filled 2017-12-04: qty 250

## 2017-12-04 NOTE — H&P (Signed)
[address]       [place] [address]             [phone]      Cardiothoracic Surgery Admission History and Physical   Referring Provider is Fath, Javier Docker, MD  PCP is Doy Hutching Leonie Douglas, MD      Chief Complaint  Patient presents with   Aortic Stenosis       HPI:  The patient is an 82 year old gentleman with a history of diabetes, hypertension, hyperlipidemia, hypothyroidism, coronary artery disease status post recent PCI with a DES to the right coronary artery, rheumatoid arthritis, peripheral neuropathy, prior stroke with significant visual defect and legally blind who has a known history of aortic stenosis has been followed with serial echocardiograms. He had an echo in March 2017 which showed severe aortic stenosis with a mean gradient of 39 mmHg and a peak gradient of 60 mmHg. The dimensionless index at that time was 0.15 with a valve area measured at 0.43 cm. Left ventricular ejection fraction at that time was 60 to 65%. He had an echocardiogram repeated recently which showed the peak velocity across aortic valve to be approaching 5 m/s with a mean gradient greater than 50 mmHg. There is mild AI and mild MR. Left ventricular systolic function was preserved from ejection fraction of greater than 55%. Cardiac catheterization was performed on 11/06/2017 and showed severe two-vessel coronary disease with a large dominant right coronary artery that had 99% proximal to mid stenosis. There is a small marginal branch with 99% stenosis. The RCA lesion was treated with a DES successfully and the patient has been maintained on dual antiplatelet therapy. The aortic valve mean gradient was 54.4 mmHg and a peak gradient was 57 mmHg. Valve area was calculated at 0.83 cm. Cardiac index was 2.99. PA pressure was 49/20 with an LVEDP of 19.   The patient has a son and a daughter who are very involved in his care. He has been widowed for the past 6 years. He lives alone and his  son lives nearby. He is very limited in his activity due to poor eyesight. He can walk around his house using a cane but if he gets outside he has to be holding onto someone's shoulder. The patient denies any shortness of breath or chest pain. He has had no orthopnea, PND, or leg swelling. He denies any dizziness or syncope. He does admit to being fatigued and his son feels like he is not very active because he does not have the energy to do anything. His son does feel like he has perked up some since he had his PCI. He had a stroke in the past with some numbness and tingling around the right side of his mouth with facial droop and some speech difficulty that completely resolved. He also had a significant visual field cut in his left eye and has been legally blind since. He has some hired help at home during the week.       Past Medical History:  Diagnosis Date   Allergic rhinitis    Aortic stenosis    BPH with obstruction/lower urinary tract symptoms    CAD (coronary artery disease)    11/06/17 PCI/DES x1 to mRCA.   Collagen vascular disease (HCC)    Rheumatoid Arthritis   Continuous right lower quadrant pain    Diabetes mellitus (HCC)    HLD (hyperlipidemia)    HTN (hypertension)    Hypothyroidism  Injury of right rotator cuff    Obesity    Osteoarthritis    Peripheral neuropathy    Rheumatoid arthritis ([place])    Stroke [facility])         Past Surgical History:  Procedure Laterality Date   CHOLECYSTECTOMY     CORONARY STENT INTERVENTION N/A 11/06/2017   Procedure: CORONARY STENT INTERVENTION; Surgeon: Sherren Mocha, MD; Location: [place] [facility]; Service: Cardiovascular; Laterality: N/A;   Lipoma excisions     RIGHT/LEFT HEART CATH AND CORONARY ANGIOGRAPHY N/A 11/06/2017   Procedure: RIGHT/LEFT HEART CATH AND CORONARY ANGIOGRAPHY; Surgeon: Sherren Mocha, MD; Location: [place] [facility]; Service: Cardiovascular; Laterality: N/A;   Surgery for sleep apnea      TONSILLECTOMY          Family History  Problem Relation Age of Onset   Diabetes Mother    Hypertension Mother    Prostate cancer Neg Hx    Bladder Cancer Neg Hx    Kidney disease Neg Hx    Social History        Socioeconomic History   Marital status: Widowed    Spouse name: Not on file   Number of children: Not on file   Years of education: Not on file   Highest education level: Not on file  Occupational History   Occupation: retired  Scientist, product/process development strain: Not on file   Food insecurity:    Worry: Not on file    Inability: Not on file   Transportation needs:    Medical: Not on file    Non-medical: Not on file  Tobacco Use   Smoking status: Former Smoker    Last attempt to quit: 10/14/1973    Years since quitting: 44.1   Smokeless tobacco: Former Systems developer   Tobacco comment: quit 1975  Substance and Sexual Activity   Alcohol use: No    Alcohol/week: 0.0 oz   Drug use: No   Sexual activity: Not on file  Lifestyle   Physical activity:    Days per week: Not on file    Minutes per session: Not on file   Stress: Not on file  Relationships   Social connections:    Talks on phone: Not on file    Gets together: Not on file    Attends religious service: Not on file    Active member of club or organization: Not on file    Attends meetings of clubs or organizations: Not on file    Relationship status: Not on file   Intimate partner violence:    Fear of current or ex partner: Not on file    Emotionally abused: Not on file    Physically abused: Not on file    Forced sexual activity: Not on file  Other Topics Concern   Not on file  Social History Narrative   Not on file         Current Outpatient Medications  Medication Sig Dispense Refill   aspirin EC 81 MG tablet Take 81 mg by mouth daily.     atorvastatin (LIPITOR) 20 MG tablet Take 20 mg by mouth at bedtime.      brimonidine (ALPHAGAN) 0.2 % ophthalmic solution Place 1  drop into the left eye 2 (two) times daily.     busPIRone (BUSPAR) 15 MG tablet Take 15 mg by mouth 3 (three) times daily.      Cholecalciferol (VITAMIN D3) 2000 units capsule Take 2,000 Units by mouth 2 (two) times  daily.      clopidogrel (PLAVIX) 75 MG tablet Take 75 mg by mouth at bedtime.      dorzolamide-timolol (COSOPT) 22.3-6.8 MG/ML ophthalmic solution Place 1 drop into both eyes 2 (two) times daily.     finasteride (PROSCAR) 5 MG tablet Take 1 tablet (5 mg total) by mouth daily. 30 tablet 12   furosemide (LASIX) 20 MG tablet Take 20 mg by mouth daily.     glipiZIDE (GLUCOTROL) 5 MG tablet Take 5 mg by mouth 2 (two) times daily before a meal.      latanoprost (XALATAN) 0.005 % ophthalmic solution Place 1 drop into both eyes at bedtime.      levothyroxine (SYNTHROID, LEVOTHROID) 75 MCG tablet Take 75 mcg by mouth daily before breakfast.     losartan (COZAAR) 50 MG tablet Take 50 mg by mouth 2 (two) times daily.      Melatonin 5 MG TABS Take 5 mg by mouth at bedtime.      metFORMIN (GLUCOPHAGE) 1000 MG tablet Take 1,000 mg by mouth 2 (two) times daily with a meal.     metoprolol tartrate (LOPRESSOR) 25 MG tablet Take 25 mg by mouth 2 (two) times daily.      Multiple Vitamins-Minerals (PRESERVISION AREDS 2) CAPS Take 1 capsule by mouth 2 (two) times daily.     nitroGLYCERIN (NITROSTAT) 0.4 MG SL tablet Place 1 tablet (0.4 mg total) under the tongue every 5 (five) minutes as needed. 25 tablet 2   nystatin (MYCOSTATIN) powder Apply 1 g topically 2 (two) times daily as needed (rash under arms).      pantoprazole (PROTONIX) 40 MG tablet Take 1 tablet (40 mg total) by mouth daily. 30 tablet 2   tamsulosin (FLOMAX) 0.4 MG CAPS capsule Take 1 capsule (0.4 mg total) by mouth daily after supper. 30 capsule 12   vitamin B-12 (CYANOCOBALAMIN) 1000 MCG tablet Take 1,000 mcg by mouth at bedtime.     No current facility-administered medications for this visit.         Allergies    Allergen Reactions   Acetaminophen Other (See Comments)    Long period of time, effects kidney   Review of Systems:   General: normal appetite, decreased energy, no weight gain, no weight loss, no fever  Cardiac: no chest pain with exertion, no chest pain at rest, noSOB with exertion, no resting SOB, no PND, no orthopnea, no palpitations, no arrhythmia, no atrial fibrillation, no LE edema, no dizzy spells, no syncope  Respiratory: no shortness of breath, no home oxygen, no productive cough, no dry cough, no bronchitis, no wheezing, no hemoptysis, no asthma, no pain with inspiration or cough, no sleep apnea, no CPAP at night  GI: no difficulty swallowing, no reflux, no frequent heartburn, no hiatal hernia, no abdominal pain, no constipation, no diarrhea, no hematochezia, no hematemesis, no melena  GU: no dysuria, no frequency, no urinary tract infection, no hematuria, no enlarged prostate, no kidney stones, no kidney disease  Vascular: no pain suggestive of claudication, no pain in feet, no leg cramps, no varicose veins, no DVT, no non-healing foot ulcer  Neuro: + remote stroke, no TIA's, no seizures, no headaches, + blindness left eye, no slurred speech, no peripheral neuropathy, no chronic pain, no instability of gait, no memory/cognitive dysfunction  Musculoskeletal: no arthritis, no joint swelling, no myalgias, no difficulty walking, reduced mobility due to sight.  Skin: no rash, no itching, no skin infections, no pressure sores or ulcerations  Psych: no  anxiety, no depression, no nervousness, no unusual recent stress  Eyes: + blurry vision, no floaters, no recent vision changes, does not wears glasses or contacts  ENT: + hearing loss, no loose or painful teeth, no dentures, last saw dentist 10/16/2017  Hematologic: no easy bruising, no abnormal bleeding, no clotting disorder, no frequent epistaxis  Endocrine: + diabetes, does check CBG's at home    Physical Exam:   BP 140/66 (BP  Location: Left Arm, Patient Position: Sitting, Cuff Size: Normal)   Pulse 60   Resp 18   Ht 5' 10"  (1.778 m)   Wt 203 lb 6.4 oz (92.3 kg)   SpO2 99% Comment: RA   BMI 29.18 kg/m  General: Elderly, somewhat frail-appearing  HEENT: Unremarkable, NCAT, PERLA, EOMI, oropharynx clear, teeth in fair condition.  Neck: no JVD, no bruits, no adenopathy or thyromegaly  Chest: clear to auscultation, symmetrical breath sounds, no wheezes, no rhonchi  CV: RRR, grade III/VI crescendo/decrescendo murmur heard best at RSB, no diastolic murmur.  Abdomen: soft, non-tender, no masses or organomegaly  Extremities: warm, well-perfused, pulses palpable in feet, no LE edema  Rectal/GU Deferred  Neuro: Grossly non-focal and symmetrical throughout  Skin: Clean and dry, no rashes, no breakdown    Diagnostic Tests:   2D Echo:  ECHOCARDIOGRAPHIC MEASUREMENTS 2D DIMENSIONS AORTA Values Normal Range MAIN PA Values Normal Range Annulus: nm* [2.3-2.9] PA Main: nm* [1.5-2.1] Aorta Sin: 3.0 cm [3.1-3.7] RIGHT VENTRICLE ST Junction: nm* [2.6-3.2] RV Base: nm* [<4.2] Asc.Aorta: nm* [2.6-3.4] RV Mid: nm* [<3.5] LEFT VENTRICLE RV Length: nm* [<8.6] LVIDd: 5.3 cm [4.2-5.9] INFERIOR VENA CAVA LVIDs: 3.9 cm Max. IVC: nm* [<=2.1] FS: 25.9 % [>25] Min. IVC: nm* SWT: 1.3 cm [0.6-1.0] ------------------ PWT: 1.3 cm [0.6-1.0] nm* - not measured LEFT ATRIUM LA Diam: 4.0 cm [3.0-4.0] LA A4C Area: nm* [<20] LA Volume: nm* [18-58] _________________________________________________________________________________________ ECHOCARDIOGRAPHIC DESCRIPTIONS AORTIC ROOT Size: Normal Dissection: No dissection AORTIC VALVE Leaflets: Tricuspid Morphology: MODERATELY THICKENED Mobility: PARTIALLY MOBILE LEFT VENTRICLE Size: Normal Anterior: Normal Contraction: Normal Lateral: Normal Closest EF: >55% (Estimated) Septal: Normal LV Masses: No Masses Apical: Normal LVH: MILD LVH Inferior: Normal Posterior: Normal Dias.FxClass: (Grade  2) relaxation abnormal, pseudonormal MITRAL VALVE Leaflets: Normal Mobility: Fully mobile Morphology: Normal LEFT ATRIUM Size: Normal LA Masses: No masses IA Septum: Normal IAS MAIN PA Size: Normal PULMONIC VALVE Morphology: Normal Mobility: Fully mobile RIGHT VENTRICLE RV Masses: No Masses Size: Normal Free Wall: Normal Contraction: Normal TRICUSPID VALVE Leaflets: Normal Mobility: Fully mobile Morphology: Normal RIGHT ATRIUM Size: Normal RA Other: None RA Mass: No masses PERICARDIUM Fluid: No effusion INFERIOR VENACAVA Size: Normal Normal respiratory collapse _________________________________________________________________________________________  DOPPLER ECHO and OTHER SPECIAL PROCEDURES Aortic: MILD AR SEVERE AS 483.7 cm/sec peak vel 93.6 mmHg peak grad 51.8 mmHg mean grad 0.43 cm^2 by DOPPLER Mitral: MILD MR No MS MV Inflow E Vel = 106.0 cm/sec MV Annulus E'Vel = 5.2 cm/sec E/E'Ratio = 20.4 Tricuspid: TRIVIAL TR No TS 148.3 cm/sec peak TR vel 13.8 mmHg peak RV pressure Pulmonary: TRIVIAL PR No PS _________________________________________________________________________________________ INTERPRETATION NORMAL LEFT VENTRICULAR SYSTOLIC FUNCTION WITH MILD LVH NORMAL RIGHT VENTRICULAR SYSTOLIC FUNCTION MILD VALVULAR REGURGITATION (See above) SEVERE VALVULAR STENOSIS (See above) Morphology: MODERATELY THICKENED Mobility: PARTIALLY MOBILE Closest EF: >55% (Estimated) LVH: MILD LVH AVS: SEVERE AS Aortic: MILD AR Mitral: MILD MR Tricuspid: TRIVIAL TR  Physicians  Panel Physicians Referring Physician Case Authorizing Physician  Sherren Mocha, MD (Primary)    Procedures  CORONARY STENT INTERVENTION  RIGHT/LEFT HEART CATH AND CORONARY  ANGIOGRAPHY  Conclusion  1. Severe 2 vessel CAD with severe stenosis of a large, dominant RCA and severe stenosis of a small OM branch  2. Severe aortic stenosis with mean gradient 54 mmHg and calculated AVA 0.83  Recommend: DAPT  with ASA and clopidogrel x 6 months. Continue multidisciplinary evaluation for TAVR  Medical Rx for residual CAD  Indications  Severe aortic stenosis [I35.0 (ICD-10-CM)]  Procedural Details/Technique  Technical Details INDICATION: Severe aortic stenosis - Pre-TAVR  PROCEDURAL DETAILS: There was an indwelling IV in a right antecubital vein. Using normal sterile technique, the IV was changed out for a 5 Fr brachial sheath over a 0.018 inch wire. The right wrist was then prepped, draped, and anesthetized with 1% lidocaine. Using the modified Seldinger technique a 5/6 French Slender sheath was placed in the right radial artery. Intra-arterial verapamil was administered through the radial artery sheath. IV heparin was administered after a JR4 catheter was advanced into the central aorta. A Swan-Ganz catheter was used for the right heart catheterization. Standard protocol was followed for recording of right heart pressures and sampling of oxygen saturations. Fick cardiac output was calculated. Standard Judkins catheters were used for selective coronary angiography. PCI is performed after the diagnostic procedure. There were no immediate procedural complications. The patient was transferred to the post catheterization recovery area for further monitoring.     Estimated blood loss <50 mL.  During this procedure the patient was administered the following to achieve and maintain moderate conscious sedation: Versed 1 mg, Fentanyl 25 mcg, while the patient's heart rate, blood pressure, and oxygen saturation were continuously monitored.  Coronary Findings  Diagnostic  Dominance: Right  Left Anterior Descending  There is mild diffuse disease throughout the vessel.  Left Circumflex  There is mild diffuse disease throughout the vessel.  First Obtuse Marginal Branch  Vessel is small in size.  Ost 1st Mrg to 1st Mrg lesion 99% stenosed  Ost 1st Mrg to 1st Mrg lesion is 99% stenosed. small vessel, subtotal  occlusion, fills late, not suitable for PCI or grafting because of small vessel size  Right Coronary Artery  Prox RCA to Mid RCA lesion 90% stenosed  Prox RCA to Mid RCA lesion is 90% stenosed. diffuse mid-segmental disease  Intervention  Prox RCA to Mid RCA lesion  Stent  Lesion crossed with guidewire using a WIRE COUGAR XT STRL 190CM. Pre-stent angioplasty was performed using a BALLOON SAPPHIRE 2.5X15. A drug-eluting stent was successfully placed using a STENT SYNERGY DES 3.5X38. Post-stent angioplasty was performed using a BALLOON SAPPHIRE [place]loge [id number]. Maximum pressure: 18 atm. A JR4 guide catheter is used. Heparin is used for anticoagulation and a therapeutic ACT is achieved (307). There is TIMI-3 flow pre-and post PCI.  Post-Intervention Lesion Assessment  The intervention was successful. Pre-interventional TIMI flow is 3. Post-intervention TIMI flow is 3. No complications occurred at this lesion.  There is no residual stenosis post intervention.  Coronary Diagrams  Diagnostic Diagram     Post-Intervention Diagram     Implants     Permanent Stent  Stent Synergy Des 3.5x38 - [id number] - Implanted   Inventory item: Melissa Noon DES 3.5X38 Model/Cat number: [id number]  Manufacturer: [employer] Lot number: [id number]  Device identifier: [id number] Device identifier type: GS1  Area Of Implantation: Mid RCA    GUDID Information   Request status Successful    Brand name: SYNERGY Version/Model: [id number]  Company name: [employer] MRI safety info as of 11/06/17: MR Conditional  Contains dry or latex rubber: No    GMDN P.T. name: Drug-eluting coronary artery stent, bioabsorbable-polymer-coated    As of 11/06/2017    Status: Implanted     MERGE Images   Link to Procedure Log    Show images for CARDIAC CATHETERIZATION  Procedure Log   Hemo Data   Most Recent Value  Fick Cardiac Output 6.39 L/min  Fick Cardiac Output Index 2.99 (L/min)/BSA    Aortic Mean Gradient 54.4 mmHg  Aortic Peak Gradient 57 mmHg  Aortic Valve Area 0.83  Aortic Value Area Index 0.39 cm2/BSA  RA A Wave 12 mmHg  RA V Wave 10 mmHg  RA Mean 9 mmHg  RV Systolic Pressure 49 mmHg  RV Diastolic Pressure 6 mmHg  RV EDP 12 mmHg  PA Systolic Pressure 49 mmHg  PA Diastolic Pressure 20 mmHg  PA Mean 31 mmHg  PW A Wave 21 mmHg  PW V Wave 25 mmHg  PW Mean 19 mmHg  AO Systolic Pressure 998 mmHg  AO Diastolic Pressure 57 mmHg  AO Mean 87 mmHg  LV Systolic Pressure 338 mmHg  LV Diastolic Pressure 9 mmHg  LV EDP 19 mmHg  Arterial Occlusion Pressure Extended Systolic Pressure 250 mmHg  Arterial Occlusion Pressure Extended Diastolic Pressure 62 mmHg  Arterial Occlusion Pressure Extended Mean Pressure 97 mmHg  Left Ventricular Apex Extended Systolic Pressure 539 mmHg  Left Ventricular Apex Extended Diastolic Pressure 10 mmHg  Left Ventricular Apex Extended EDP Pressure 31 mmHg  QP/QS 1  TPVR Index 10.39 HRUI  TSVR Index 29.14 HRUI  PVR SVR Ratio 0.15  TPVR/TSVR Ratio 0.36   ADDENDUM REPORT: 11/20/2017 17:09  CLINICAL DATA: 82 year old male with severe aortic stenosis being  evaluated for a TAVR procedure.  EXAM:  Cardiac TAVR CT  TECHNIQUE:  The patient was scanned on a [employer]. A 120 kV  retrospective scan was triggered in the descending thoracic aorta at  111 HU's. Gantry rotation speed was 250 msecs and collimation was .6  mm. No beta blockade or nitro were given. The 3D data set was  reconstructed in 5% intervals of the R-R cycle. Systolic and  diastolic phases were analyzed on a dedicated work station using  MPR, MIP and VRT modes. The patient received 80 cc of contrast.  FINDINGS:  Aortic Valve: Trileaflet aortic valve with severely thickened and  calcified leaflets and severely restricted leaflet openings. There  are no calcifications extending into the LVOT.  Aorta: Normal size, mild diffuse calcifications, no dissection.   Sinotubular Junction: 26 x 25 mm  Ascending Thoracic Aorta: 30 x 29 mm  Aortic Arch: 23 x 21 mm  Descending Thoracic Aorta: 24 x 22 mm  Sinus of Valsalva Measurements:  Non-coronary: 29 mm  Right -coronary: 31 mm  Left -coronary: 32 mm  Coronary Artery Height above Annulus:  Left Main: 12 mm  Right Coronary: 20 mm  Virtual Basal Annulus Measurements:  Maximum/Minimum Diameter: 27.6 x 24.2 mm  Mean Diameter: 25.2 mm  Perimeter: 80.5 mm  Area: 500 mm2  Optimum Fluoroscopic Angle for Delivery: LAO 10 CAU 10  IMPRESSION:  1. Trileaflet aortic valve with severely thickened and calcified  leaflets and severely restricted leaflet openings. Annular  measurements suitable for delivery of a 26 mm Edwards-SAPIEN 3  valve.  2. Sufficient coronary to annulus distance.  3. Optimum Fluoroscopic Angle for Delivery: LAO 10 CAU 10  4. No thrombus in the left atrial appendage.  Electronically Signed  By: Ena Dawley  On: 11/20/2017 17:09   Addended by Dorothy Spark, MD on 11/20/2017 5:12 PM  Study Result   EXAM:  OVER-READ INTERPRETATION CT CHEST  The following report is an over-read performed by radiologist Dr.  Vinnie Langton of [facility], [place] on 11/20/2017. This  over-read does not include interpretation of cardiac or coronary  anatomy or pathology. The coronary calcium score/coronary CTA  interpretation by the cardiologist is attached.  COMPARISON: Chest CT 03/28/2013.  FINDINGS:  Extracardiac findings will be described separately under dictation  for contemporaneously obtained CTA chest, abdomen and pelvis dated  11/20/2017.  IMPRESSION:  Please see separate dictation for contemporaneously obtained CTA  chest, abdomen and pelvis 11/20/2017 for full description of  relevant extracardiac findings.  Electronically Signed:  By: Vinnie Langton M.D.  On: 11/20/2017 16:56   CT ANGIO ABDOMEN PELVIS W &/OR WO CONTRAST (Accession [id number]) (Order [id number])  Imaging   Date: 11/20/2017 Department: [facility] CT IMAGING Released By: Ermalinda Barrios Authorizing: Sherren Mocha, MD  Exam Information  Status Exam Begun  Exam Ended   Final [99] 11/20/2017 3:57 PM 11/20/2017 4:29 PM  Study Result  CLINICAL DATA: 81 year old male with history of severe aortic  stenosis. Preprocedural study prior to potential transcatheter  aortic valve replacement (TAVR) procedure.  EXAM:  CT ANGIOGRAPHY CHEST, ABDOMEN AND PELVIS  TECHNIQUE:  Multidetector CT imaging through the chest, abdomen and pelvis was  performed using the standard protocol during bolus administration of  intravenous contrast. Multiplanar reconstructed images and MIPs were  obtained and reviewed to evaluate the vascular anatomy.  CONTRAST: 13m ISOVUE-370 IOPAMIDOL (ISOVUE-370) INJECTION 76%  COMPARISON: Chest CT 03/28/2013. CT the abdomen and pelvis  07/17/2014.  FINDINGS:  CTA CHEST FINDINGS  Cardiovascular: Heart size is normal. There is no significant  pericardial fluid, thickening or pericardial calcification. There is  aortic atherosclerosis, as well as atherosclerosis of the great  vessels of the mediastinum and the coronary arteries, including  calcified atherosclerotic plaque in the left main, left anterior  descending and right coronary arteries. Severe calcifications and  thickening of the aortic valve. Lipomatous hypertrophy of the  interatrial septum (normal anatomical variant) is incidentally  noted.  Mediastinum/Lymph Nodes: Several prominent borderline enlarged  mediastinal and hilar lymph nodes are noted measuring up to 1.4 cm  in short axis in the right hilar nodal station. Esophagus is  unremarkable in appearance. No axillary lymphadenopathy.  Lungs/Pleura: Several tiny 2-3 mm pulmonary nodules are noted in the  lungs bilaterally, nonspecific, but statistically likely benign. No  larger more suspicious appearing pulmonary nodules or masses are  noted. Patchy  areas of ground-glass attenuation and mild  interlobular septal thickening are noted, most evident throughout  the mid to lower lungs, favored to reflect a background of mild  interstitial pulmonary edema. No consolidative airspace disease. No  pleural effusions.  Musculoskeletal/Soft Tissues: There are no aggressive appearing  lytic or blastic lesions noted in the visualized portions of the  skeleton.  CTA ABDOMEN AND PELVIS FINDINGS  Hepatobiliary: In the central aspect of segment 7 of the liver  (axial image 79 of series 14) there is a 1.9 x 1.7 cm lesion which  is centrally low-attenuation in demonstrates some peripheral nodular  enhancement, stable compared to prior study from 2016, most  compatible with a cavernous hemangioma. No other suspicious  appearing cystic or solid hepatic lesions are noted. No intra or  extrahepatic biliary ductal dilatation. Status post cholecystectomy.  Pancreas: No pancreatic mass. No pancreatic  ductal dilatation. No  pancreatic or peripancreatic fluid or inflammatory changes.  Spleen: Large splenule adjacent to the splenic hilum.  Adrenals/Urinary Tract: Subcentimeter low-attenuation lesion in the  lower pole of the right kidney his too small to characterize, but  similar to prior study, statistically likely a cyst. Mild atrophy of  both kidneys. No hydroureteronephrosis. Urinary bladder is normal in  appearance. Bilateral adrenal glands are normal in appearance.  Stomach/Bowel: Normal appearance of the stomach. No pathologic  dilatation of small bowel or colon. Numerous colonic diverticulae  are noted, without surrounding inflammatory changes to suggest an  acute diverticulitis at this time. Normal appendix.  Vascular/Lymphatic: Extensive aortic atherosclerosis without  evidence of aneurysm or dissection in the abdominal or pelvic  vasculature. Vascular findings and measurements pertinent to  potential TAVR procedure, as detailed below. Celiac axis,  superior  mesenteric artery and inferior mesenteric artery are all widely  patent without hemodynamically significant stenosis. Mild stenosis  of the dominant left renal artery. There is also a small accessory  branch to the lower pole which appears patent. Single right renal  artery is widely patent without hemodynamically significant  stenosis. No lymphadenopathy noted in the abdomen or pelvis.  Reproductive: Prostate gland and seminal vesicles are unremarkable  in appearance.  Other: No significant volume of ascites. No pneumoperitoneum.  Musculoskeletal: There are no aggressive appearing lytic or blastic  lesions noted in the visualized portions of the skeleton.  VASCULAR MEASUREMENTS PERTINENT TO TAVR:  AORTA:  Minimal Aortic Diameter-10 x 9 mm  Severity of Aortic Calcification-severe  RIGHT PELVIS:  Right Common Iliac Artery -  Minimal Diameter-9.5 x 6.8 mm  Tortuosity-mild  Calcification-moderate  Right External Iliac Artery -  Minimal Diameter-5.8 x 4.3 mm  Tortuosity-severe  Calcification-mild  Right Common Femoral Artery -  Minimal Diameter-6.7 x 5.1 mm  Tortuosity-mild  Calcification-moderate  LEFT PELVIS:  Left Common Iliac Artery -  Minimal Diameter-7.3 x 6.5 mm  Tortuosity-mild  Calcification-moderate  Left External Iliac Artery -  Minimal Diameter-6.1 x 4.0 mm  Tortuosity-moderate  Calcification-mild  Left Common Femoral Artery -  Minimal Diameter-5.9 x 5.8 mm  Tortuosity-mild  Calcification-moderate  Review of the MIP images confirms the above findings.  IMPRESSION:  1. Vascular findings and measurements pertinent to potential TAVR  procedure, as detailed above.  2. Severe thickening calcification of the aortic valve, compatible  with the reported clinical history of severe aortic stenosis.  3. Findings in the lungs concerning for mild interstitial pulmonary  edema.  4. Multiple prominent borderline enlarged mediastinal and hilar  lymph nodes. These  are nonspecific, but in the setting of possible  pulmonary edema are favored to be benign. Further clinical  evaluation to exclude lymphoproliferative disorder should be  considered.  5. Aortic atherosclerosis, in addition to left main and 3 vessel  coronary artery disease.  6. Colonic diverticulosis without evidence of acute diverticulitis  at this time.  7. Cavernous hemangioma in segment 7 of the liver again noted, as  above.  8. Status post cholecystectomy.  9. Additional incidental findings, as above.  Aortic Atherosclerosis (ICD10-I70.0).  Electronically Signed  By: Vinnie Langton M.D.  On: 11/21/2017 08:57    Physical therapy evaluation:   Clinical Impression Statement: Pt is an 82 yo male presenting to OP PT for evaluation prior to possible TAVR surgery due to severe aortic stenosis. Pt reports onset of decreased endurance approximately 3 months ago. Symptoms are limiting ability to perform ADl's . Pt presents with decreased ROM and  strength, and balance and is at high fall risk 4 stage balance test, decreased walking speed and decreased aerobic endurance per 6 minute walk test. Pt ambulated 240 feet in 3:04 before requesting a seated rest beak lasting 1:04. At time of rest, patients HR was 88 bpm and O2 was 99 on room air. Pt reported 2/10 shortness of breath on modified scale for dyspnea. Pt able to resume after rest and ambulate an additional 142 feet. Pt ambulated a total of 381 feet in 6 minute walk. B/P increased significantly with 6 minute walk test. Based on the Short Physical Performance Battery, patient has a frailty rating of 3/12 with </= 5/12 considered frail. Patients mobility also effected by vision. Patient needed guidance for gait.   Impression:   This 82 year old gentleman has what I feel is stage D, severe, symptomatic aortic stenosis with New York Heart Association class II symptoms of exertional fatigue and shortness of breath consistent with chronic diastolic  congestive heart failure. He and his son have noted the exertional fatigue but he denies any shortness of breath. During his physical therapy evaluation he developed shortness of breath and fatigue after ambulating 240 feet in 3 minutes and 4 seconds and had to take a sitting rest break before completing 381 total feet in 6 minutes. I think he probably does not notice any shortness of breath at home because he is mostly ambulating short distances around in his house and if he gets outside walking he is going very slowly and holding onto someone's shoulder due to his vision loss. I have personally reviewed his 2D echocardiogram done at [facility], cardiac catheterization, and CTA studies. His echocardiogram shows a trileaflet aortic valve with severe calcification and thickening and markedly restricted leaflet mobility. The peak velocity is approaching 5 m/s consistent with severe aortic stenosis. His cardiac catheterization showed a mean gradient of 54 mmHg and an aortic valve area calculated 0.83 cm consistent with severe aortic stenosis. He had a high-grade dominant RCA stenosis that was treated successfully with PCI with a drug-eluting stent on 11/06/2017 and he has been maintained on dual antiplatelet therapy since. His son is noticed that he was a little more active since having his PCI. I agree that aortic valve replacement is probably indicated in this patient to improve his stamina and prevent progressive left ventricular deterioration. Although he is 82 years old and legally blind he still lives independently at home with someone coming in during the day to help him take care of his house. I do not think he would be a candidate for open surgical AVR due to the significant risk of morbidity and mortality due to his age, arthritis, and vision loss which would make recovery difficult. I think transcatheter aortic valve replacement would be the best treatment for him. His gated cardiac CTA shows anatomy that is  favorable for TAVR with no complicating features. His abdominal and pelvic CTA shows some focal narrowing of both external iliac arteries but I think there is adequate pelvic vascular access to allow transfemoral insertion. The left axillary artery would be an alternative access site as would transapical insertion.  The patient and his son were counseled at length regarding treatment alternatives for management of severe symptomatic aortic stenosis. The risks and benefits of surgical intervention has been discussed in detail. Long-term prognosis with medical therapy was discussed. Alternative approaches such as conventional surgical aortic valve replacement, transcatheter aortic valve replacement, and palliative medical therapy were compared and contrasted at length. This discussion  was placed in the context of the patient's own specific clinical presentation and past medical history. All of their questions have been addressed. They understand that he will have to have a second surgical evaluation before a decision is made about the appropriateness of TAVR in his case.   A discussion was held regarding what types of management strategies would be attempted intraoperatively in the event of life-threatening complications, including whether or not the patient would be considered a candidate for the use of cardiopulmonary bypass and/or conversion to open sternotomy for attempted surgical intervention. I do not think he would be a candidate for sternotomy for any complications that developed. The patient has been advised of a variety of complications that might develop including but not limited to risks of death, stroke, paravalvular leak, aortic dissection or other major vascular complications, aortic annulus rupture, device embolization, cardiac rupture or perforation, mitral regurgitation, acute myocardial infarction, arrhythmia, heart block or bradycardia requiring permanent pacemaker placement, congestive heart  failure, respiratory failure, renal failure, pneumonia, infection, other late complications related to structural valve deterioration or migration, or other complications that might ultimately cause a temporary or permanent loss of functional independence or other long term morbidity. The patient provides full informed consent for the procedure as described and all questions were answered.   Plan:   Transfemoral transcatheter aortic valve replacement.   Gaye Pollack, MD

## 2017-12-04 NOTE — Progress Notes (Signed)
Anesthesia Chart Review:  Case:  [id number] Date/Time:  12/05/17 1233   Procedures:      TRANSCATHETER AORTIC VALVE REPLACEMENT, TRANSFEMORAL (N/A Chest)     TRANSESOPHAGEAL ECHOCARDIOGRAM (TEE) (N/A )   Anesthesia type:  General   Pre-op diagnosis:  Severe Aortic Stenosis   Location:  [facility] OR ROOM 16 / [facility]   Surgeon:  Sherren Mocha, MD      DISCUSSION: Patient is a 82 year old male scheduled for the above procedure.   History includes former smoker (quit '75), CAD (DES RCA 11/06/17), severe AS, RA, CVA (now legally blind left eye), DM2, peripheral neuropathy, OSA (s/p surgery). Labs show mild leukocytosis which overall appears consistent with labs over the past six months (see [facility] and [place]). No acute symptoms reported at PAT. CXR showed minimal right basilar atelectasis. No leukocytes on UA. Patient also evaluated by CT surgeon Darylene Price, MD earlier today. Results called to Nell Range, PA-C with the TAVR team. If surgical team feels labs are acceptable and otherwise no acute changes then I would anticipate that he can proceed as planned.   VS: BP (!) 125/50   Pulse 60   Temp 36.6 C   Resp 18   Ht 5\' 10"  (1.778 m)   Wt 204 lb 1.6 oz (92.6 kg)   SpO2 100%   BMI 29.29 kg/m   PROVIDERS: Idelle Crouch, MD is PCP [facility] Everywhere) Bartholome Bill, MD is cardiologist [facility] [place])   LABS: Preoperative labs noted. BUN 34, Cr 1.54 (previously Cr 1.34-1.55 11/2017). A1c 5.9. WBC 16.1 (previously 15.0-15.9 11/2017, 12.7 12/21/16, 10.4 09/18/15 in [facility];12.0-17.6 08/10/16-08/31/17 in [facility]). PLT 124, appears stable since 2017. UA negative for leukocytes, nitrites. CXR showed minimal right basilar atelectasis. (all labs ordered are listed, but only abnormal results are displayed)  Labs Reviewed  GLUCOSE, CAPILLARY - Abnormal; Notable for the following components:      Result Value   Glucose-Capillary 224  (*)    All other components within normal limits  BLOOD GAS, ARTERIAL - Abnormal; Notable for the following components:   pH, Arterial 7.451 (*)    All other components within normal limits  BRAIN NATRIURETIC PEPTIDE - Abnormal; Notable for the following components:   B Natriuretic Peptide 149.8 (*)    All other components within normal limits  CBC - Abnormal; Notable for the following components:   WBC 16.1 (*)    Hemoglobin 12.9 (*)    Platelets 124 (*)    All other components within normal limits  COMPREHENSIVE METABOLIC PANEL - Abnormal; Notable for the following components:   Glucose, Bld 202 (*)    BUN 34 (*)    Creatinine, Ser 1.54 (*)    Total Protein 6.3 (*)    GFR calc non Af Amer 41 (*)    GFR calc Af Amer 47 (*)    All other components within normal limits  HEMOGLOBIN A1C - Abnormal; Notable for the following components:   Hgb A1c MFr Bld 5.9 (*)    All other components within normal limits  URINALYSIS, ROUTINE W REFLEX MICROSCOPIC - Abnormal; Notable for the following components:   Color, Urine STRAW (*)    All other components within normal limits  SURGICAL PCR SCREEN  APTT  PROTIME-INR  TYPE AND SCREEN    IMAGES: CXR 12/04/17: IMPRESSION: Minimal RIGHT basilar atelectasis. Enlargement of cardiac silhouette.  CTA chest/abd/pelvis 11/20/17: IMPRESSION: 1. Vascular findings  and measurements pertinent to potential TAVR procedure, as detailed above. 2. Severe thickening calcification of the aortic valve, compatible with the reported clinical history of severe aortic stenosis. 3. Findings in the lungs concerning for mild interstitial pulmonary edema. 4. Multiple prominent borderline enlarged mediastinal and hilar lymph nodes. These are nonspecific, but in the setting of possible pulmonary edema are favored to be benign. Further clinical evaluation to exclude lymphoproliferative disorder should be considered. 5. Aortic atherosclerosis, in addition to left main  and 3 vessel coronary artery disease. 6. Colonic diverticulosis without evidence of acute diverticulitis at this time. 7. Cavernous hemangioma in segment 7 of the liver again noted, as above. 8. Status post cholecystectomy. 9. Additional incidental findings, per full report (see Results Review).   OTHER: PFTs 11/20/17: FVC 2.71 (62%), FEV1 2.52 (81%), DLCO unc 15.07 (43%).   EKG: 11/07/17 EKG: SR with first degree AV block.   CV: Coronary CT 11/20/17: IMPRESSION: 1. Trileaflet aortic valve with severely thickened and calcified leaflets and severely restricted leaflet openings. Annular measurements suitable for delivery of a 26 mm Edwards-SAPIEN 3 valve. 2. Sufficient coronary to annulus distance. 3. Optimum Fluoroscopic Angle for Delivery: LAO 10 CAU 10 4. No thrombus in the left atrial appendage.  Cardiac cath 11/06/17: Left Anterior Descending  There is mild diffuse disease throughout the vessel.  Left Circumflex  There is mild diffuse disease throughout the vessel.  First Obtuse Marginal Branch  Vessel is small in size.  Ost 1st Mrg to 1st Mrg lesion 99% stenosed  Ost 1st Mrg to 1st Mrg lesion is 99% stenosed. small vessel, subtotal occlusion, fills late, not suitable for PCI or grafting because of small vessel size  Right Coronary Artery  Prox RCA to Mid RCA lesion 90% stenosed  Prox RCA to Mid RCA lesion is 90% stenosed. diffuse mid-segmental disease  PCI: Pre-stent angioplasty was performed using a BALLOON SAPPHIRE 2.5X15. A drug-eluting stent was successfully placed using a STENT SYNERGY DES 3.5X38. Post-stent angioplasty was performed using a BALLOON SAPPHIRE [place] [id number]. There is no residual stenosis post intervention. Conclusion: 1. Severe 2 vessel CAD with severe stenosis of a large, dominant RCA and severe stenosis of a small OM branch 2. Severe aortic stenosis with mean gradient 54 mmHg and calculated AVA 0.83 Recommend: DAPT with ASA and clopidogrel x 6 months.  Continue multidisciplinary evaluation for TAVR Medical Rx for residual CAD  Echo 08/21/17 ([place]): INTERPRETATION NORMAL LEFT VENTRICULAR SYSTOLIC FUNCTION WITH MILD LVH NORMAL RIGHT VENTRICULAR SYSTOLIC FUNCTION MILD VALVULAR REGURGITATION (See below) SEVERE VALVULAR STENOSIS (See below) Morphology: MODERATELY THICKENED Mobility: PARTIALLY MOBILE Closest EF: >55% (Estimated) LVH: MILD LVH AVS: SEVERE AS. (483.7 cm/sec peak vel, 93.6 mmHg peak grad, 51.8 mmHg mean grad, 0.43 cm^2 by DOPPLER) Aortic: MILD AR Mitral: MILD MR Tricuspid: TRIVIAL TR  Carotid U/S 11/20/17:  Final Interpretation: Right Carotid: There is no evidence of stenosis in the right ICA. Left Carotid: Velocities in the left ICA are consistent with a 1-39% stenosis. Vertebrals: Bilateral vertebral arteries demonstrate antegrade flow. Subclavians: Normal flow hemodynamics were seen in bilateral subclavian arteries.  Past Medical History:  Diagnosis Date  . Allergic rhinitis   . Aortic stenosis   . BPH with obstruction/lower urinary tract symptoms   . CAD (coronary artery disease)    11/06/17 PCI/DES x1 to mRCA.  . Collagen vascular disease ([place])    Rheumatoid Arthritis  . Continuous right lower quadrant pain   . Diabetes mellitus ([place])   . HLD (hyperlipidemia)   .  HTN (hypertension)   . Hypothyroidism   . Injury of right rotator cuff   . Obesity   . Osteoarthritis   . Peripheral neuropathy   . Rheumatoid arthritis ([place])   . Stroke [facility])     Past Surgical History:  Procedure Laterality Date  . CHOLECYSTECTOMY    . CORONARY STENT INTERVENTION N/A 11/06/2017   Procedure: CORONARY STENT INTERVENTION;  Surgeon: Sherren Mocha, MD;  Location: [place] [facility];  Service: Cardiovascular;  Laterality: N/A;  . Lipoma excisions    . RIGHT/LEFT HEART CATH AND CORONARY ANGIOGRAPHY N/A 11/06/2017   Procedure: RIGHT/LEFT HEART CATH AND CORONARY ANGIOGRAPHY;  Surgeon: Sherren Mocha, MD;  Location:  [place] [facility] LAB;  Service: Cardiovascular;  Laterality: N/A;  . Surgery for sleep apnea    . TONSILLECTOMY      MEDICATIONS: . aspirin EC 81 MG tablet  . atorvastatin (LIPITOR) 20 MG tablet  . brimonidine (ALPHAGAN) 0.2 % ophthalmic solution  . busPIRone (BUSPAR) 15 MG tablet  . Cholecalciferol (VITAMIN D3) 2000 units capsule  . clopidogrel (PLAVIX) 75 MG tablet  . dorzolamide-timolol (COSOPT) 22.3-6.8 MG/ML ophthalmic solution  . finasteride (PROSCAR) 5 MG tablet  . furosemide (LASIX) 40 MG tablet  . glipiZIDE (GLUCOTROL) 5 MG tablet  . latanoprost (XALATAN) 0.005 % ophthalmic solution  . levothyroxine (SYNTHROID, LEVOTHROID) 75 MCG tablet  . losartan (COZAAR) 50 MG tablet  . Melatonin 5 MG TABS  . metFORMIN (GLUCOPHAGE) 1000 MG tablet  . metoprolol tartrate (LOPRESSOR) 25 MG tablet  . Multiple Vitamins-Minerals (PRESERVISION AREDS 2) CAPS  . nitroGLYCERIN (NITROSTAT) 0.4 MG SL tablet  . nystatin (MYCOSTATIN) powder  . pantoprazole (PROTONIX) 40 MG tablet  . tamsulosin (FLOMAX) 0.4 MG CAPS capsule  . vitamin B-12 (CYANOCOBALAMIN) 1000 MCG tablet   No current facility-administered medications for this encounter.    Derrill Memo ON 12/05/2017] cefUROXime (ZINACEF) 1.5 g in sodium chloride 0.9 % 100 mL IVPB  . [START ON 12/05/2017] dexmedetomidine (PRECEDEX) 400 MCG/100ML (4 mcg/mL) infusion  . [START ON 12/05/2017] DOPamine (INTROPIN) 800 mg in dextrose 5 % 250 mL (3.2 mg/mL) infusion  . [START ON 12/05/2017] EPINEPHrine (ADRENALIN) 4 mg in dextrose 5 % 250 mL (0.016 mg/mL) infusion  . [START ON 12/05/2017] heparin 30,000 units/NS 1000 mL solution for CELLSAVER  . [START ON 12/05/2017] insulin regular (NOVOLIN R,HUMULIN R) 100 Units in sodium chloride 0.9 % 100 mL (1 Units/mL) infusion  . [START ON 12/05/2017] magnesium sulfate (IV Push/IM) injection 40 mEq  . [START ON 12/05/2017] nitroGLYCERIN 50 mg in dextrose 5 % 250 mL (0.2 mg/mL) infusion  . [START ON 12/05/2017] norepinephrine (LEVOPHED)  4mg  in D5W 274mL premix infusion  . [START ON 12/05/2017] phenylephrine (NEO-SYNEPHRINE) 20 mg in sodium chloride 0.9 % 250 mL (0.08 mg/mL) infusion  . [START ON 12/05/2017] potassium chloride injection 80 mEq  . [START ON 12/05/2017] vancomycin (VANCOCIN) 1,500 mg in sodium chloride 0.9 % 250 mL IVPB   George Hugh [facility] Short Stay Center/Anesthesiology Phone [phone] 12/04/2017 3:43 PM

## 2017-12-04 NOTE — Progress Notes (Signed)
PCP: Fulton Reek, MD  Cardiologist: Dr. Bartholome Bill  EKG: 11/07/17 in EPIC  Stress test: 12/14/1998 Care Everywhere  ECHO: 09/17/15 in EPIC  Cardiac Cath: 11/07/17  Chest x-ray: 12/04/17 per order

## 2017-12-04 NOTE — Progress Notes (Signed)
[facility]  MULTIDISCIPLINARY HEART VALVE CLINIC  CARDIOTHORACIC SURGERY CONSULTATION REPORT  Referring Provider is Fath, Javier Docker, MD PCP is Doy Hutching Leonie Douglas, MD  Chief Complaint  Patient presents with   Aortic Stenosis    2ND TAVE EVAL Dr. Ubaldo Glassing ECHO CD loaded/care everywhere report, consulted with Dr. Cyndia Bent 11/22/17    HPI:  Patient is an 82 year old male with history of aortic stenosis, type 2 diabetes mellitus, hypertension, hyperlipidemia, hypothyroidism, coronary artery disease status post recent PCI and stenting of the right coronary artery, rheumatoid arthritis, peripheral neuropathy, previous stroke with significant visual defect and blindness who has been referred for second surgical consultation to discuss treatment options for management of severe aortic stenosis.  Patient has known of the presence of aortic stenosis and heart murmur for several years.  He has been followed by Dr. Ubaldo Glassing with serial echocardiograms and remained essentially asymptomatic.  He has become progressively less active over the last several years, primarily because of a combination of severe visual impairment, rheumatoid arthritis, and peripheral neuropathy.  He suffered a minor stroke several years ago.  He has developed progressive fatigue and generalized weakness.   transthoracic echocardiogram performed in 2017 revealed severe aortic stenosis with mean transvalvular gradient estimated 39 mmHg and DVI reported only 0.15.  Left ventricular function remain normal and the patient was reportedly asymptomatic at that time.  He was followed carefully.  Recent follow-up echocardiogram performed August 21, 2017 revealed considerable progression in the severity of disease with peak velocity across the aortic valve measured close to 5 m/s corresponding to mean transvalvular gradient estimated 51.8 mmHg.  Left ventricular systolic function was slightly decreased at 55%.  The patient was referred to  the multidisciplinary heart valve clinic and underwent diagnostic cardiac catheterization by Dr. Burt Knack on 02/06/2018.  Catheterization confirmed the presence of severe, bordering on critical aortic stenosis.  Peak to peak and mean transvalvular gradients were measured 57 and 54.4 mmHg, respectively, corresponding to aortic valve area calculated 0.83 cm.  Cardiac output was normal.  Pulmonary artery pressures were mildly elevated.  The patient was also noted to have severe single-vessel coronary artery disease with high-grade long segment stenosis of the right coronary artery.  The patient underwent PCI and stenting of the right coronary artery with good result.  The patient subsequently underwent CT angiography and was referred for surgical consultation.  He has been evaluated previously by Dr. Cyndia Bent and transcatheter aortic valve replacement recommended.  Patient presents for second surgical consultation today.  The patient has widowed and lives alone in [place].  He has 2 adult children and a support network that are very involved and look after him.  He is able to get around inside the house without much difficulty but he cannot go outside and do any sort of chores because of his extreme visual impairment.  He admits to some degree of progressive exertional fatigue.  He denies exertional shortness of breath or chest discomfort.  He has not had resting shortness of breath, PND, orthopnea, or lower extremity edema.  He has not had tachypalpitations, dizzy spells, nor syncope.  Past Medical History:  Diagnosis Date   Allergic rhinitis    Aortic stenosis    BPH with obstruction/lower urinary tract symptoms    CAD (coronary artery disease)    11/06/17 PCI/DES x1 to mRCA.   Collagen vascular disease ([place])    Rheumatoid Arthritis   Continuous right lower quadrant pain    Diabetes mellitus ([place])  HLD (hyperlipidemia)    HTN (hypertension)    Hypothyroidism    Injury of right rotator cuff     Obesity    Osteoarthritis    Peripheral neuropathy    Rheumatoid arthritis ([place])    Stroke [facility])     Past Surgical History:  Procedure Laterality Date   CHOLECYSTECTOMY     CORONARY STENT INTERVENTION N/A 11/06/2017   Procedure: CORONARY STENT INTERVENTION;  Surgeon: Sherren Mocha, MD;  Location: [facility];  Service: Cardiovascular;  Laterality: N/A;   Lipoma excisions     RIGHT/LEFT HEART CATH AND CORONARY ANGIOGRAPHY N/A 11/06/2017   Procedure: RIGHT/LEFT HEART CATH AND CORONARY ANGIOGRAPHY;  Surgeon: Sherren Mocha, MD;  Location: [facility];  Service: Cardiovascular;  Laterality: N/A;   Surgery for sleep apnea     TONSILLECTOMY      Family History  Problem Relation Age of Onset   Diabetes Mother    Hypertension Mother    Prostate cancer Neg Hx    Bladder Cancer Neg Hx    Kidney disease Neg Hx     Social History   Socioeconomic History   Marital status: Widowed    Spouse name: Not on file   Number of children: Not on file   Years of education: Not on file   Highest education level: Not on file  Occupational History   Occupation: retired  Scientist, product/process development strain: Not on file   Food insecurity:    Worry: Not on file    Inability: Not on file   Transportation needs:    Medical: Not on file    Non-medical: Not on file  Tobacco Use   Smoking status: Former Smoker    Last attempt to quit: 10/14/1973    Years since quitting: 44.1   Smokeless tobacco: Former Systems developer   Tobacco comment: quit 1975  Substance and Sexual Activity   Alcohol use: No    Alcohol/week: 0.0 oz   Drug use: No   Sexual activity: Not on file  Lifestyle   Physical activity:    Days per week: Not on file    Minutes per session: Not on file   Stress: Not on file  Relationships   Social connections:    Talks on phone: Not on file    Gets together: Not on file    Attends religious service: Not on file    Active member of club  or organization: Not on file    Attends meetings of clubs or organizations: Not on file    Relationship status: Not on file   Intimate partner violence:    Fear of current or ex partner: Not on file    Emotionally abused: Not on file    Physically abused: Not on file    Forced sexual activity: Not on file  Other Topics Concern   Not on file  Social History Narrative   Not on file    Current Outpatient Medications  Medication Sig Dispense Refill   aspirin EC 81 MG tablet Take 81 mg by mouth daily.     atorvastatin (LIPITOR) 20 MG tablet Take 20 mg by mouth at bedtime.      brimonidine (ALPHAGAN) 0.2 % ophthalmic solution Place 1 drop into the left eye 2 (two) times daily.     busPIRone (BUSPAR) 15 MG tablet Take 15 mg by mouth 2 (two) times daily.      Cholecalciferol (VITAMIN D3) 2000 units capsule Take 2,000 Units  by mouth 2 (two) times daily.      clopidogrel (PLAVIX) 75 MG tablet Take 75 mg by mouth at bedtime.      dorzolamide-timolol (COSOPT) 22.3-6.8 MG/ML ophthalmic solution Place 1 drop into both eyes 2 (two) times daily.     finasteride (PROSCAR) 5 MG tablet Take 1 tablet (5 mg total) by mouth daily. 30 tablet 12   furosemide (LASIX) 40 MG tablet Take 40 mg by mouth daily.     glipiZIDE (GLUCOTROL) 5 MG tablet Take 5 mg by mouth 2 (two) times daily before a meal.      latanoprost (XALATAN) 0.005 % ophthalmic solution Place 1 drop into both eyes at bedtime.      levothyroxine (SYNTHROID, LEVOTHROID) 75 MCG tablet Take 75 mcg by mouth daily before breakfast.     losartan (COZAAR) 50 MG tablet Take 50 mg by mouth 2 (two) times daily.      Melatonin 5 MG TABS Take 5 mg by mouth at bedtime.      metFORMIN (GLUCOPHAGE) 1000 MG tablet Take 1,000 mg by mouth 2 (two) times daily with a meal.     metoprolol tartrate (LOPRESSOR) 25 MG tablet Take 25 mg by mouth 2 (two) times daily.      Multiple Vitamins-Minerals (PRESERVISION AREDS 2) CAPS Take 1 capsule by mouth 2  (two) times daily.     nitroGLYCERIN (NITROSTAT) 0.4 MG SL tablet Place 1 tablet (0.4 mg total) under the tongue every 5 (five) minutes as needed. (Patient taking differently: Place 0.4 mg under the tongue every 5 (five) minutes as needed for chest pain. ) 25 tablet 2   nystatin (MYCOSTATIN) powder Apply 1 g topically 2 (two) times daily as needed (rash under arms).      pantoprazole (PROTONIX) 40 MG tablet Take 1 tablet (40 mg total) by mouth daily. 30 tablet 2   tamsulosin (FLOMAX) 0.4 MG CAPS capsule Take 1 capsule (0.4 mg total) by mouth daily after supper. 30 capsule 12   vitamin B-12 (CYANOCOBALAMIN) 1000 MCG tablet Take 1,000 mcg by mouth at bedtime.     No current facility-administered medications for this visit.     Allergies  Allergen Reactions   Acetaminophen Other (See Comments)    Long period of time, effects kidney      Review of Systems:   General:  normal appetite, decreased energy, no weight gain, no weight loss, no fever  Cardiac:  no chest pain with exertion, no chest pain at rest, no SOB with exertion, no resting SOB, no PND, no orthopnea, no palpitations, no arrhythmia, no atrial fibrillation, no LE edema, no dizzy spells, no syncope  Respiratory:  no shortness of breath, no home oxygen, no productive cough, no dry cough, no bronchitis, no wheezing, no hemoptysis, no asthma, no pain with inspiration or cough, no sleep apnea, no CPAP at night  GI:   no difficulty swallowing, no reflux, no frequent heartburn, no hiatal hernia, no abdominal pain, no constipation, no diarrhea, no hematochezia, no hematemesis, no melena  GU:   no dysuria,  no frequency, no urinary tract infection, no hematuria, no enlarged prostate, no kidney stones, no kidney disease  Vascular:  no pain suggestive of claudication, no pain in feet, no leg cramps, no varicose veins, no DVT, no non-healing foot ulcer  Neuro:   + stroke, no TIA's, no seizures, no headaches, no temporary blindness one eye,   no slurred speech, + peripheral neuropathy, no chronic pain, + instability of gait, +  memory/cognitive dysfunction  Musculoskeletal: + arthritis, + joint swelling, no myalgias, + difficulty walking, limited mobility   Skin:   no rash, no itching, no skin infections, no pressure sores or ulcerations  Psych:   no anxiety, no depression, no nervousness, no unusual recent stress  Eyes:   + blurry vision, no floaters, no recent vision changes, + wears glasses or contacts  ENT:   + hearing loss, no loose or painful teeth, no dentures, last saw dentist 10/16/2017  Hematologic:  no easy bruising, no abnormal bleeding, no clotting disorder, no frequent epistaxis  Endocrine:  + diabetes, does check CBG's at home           Physical Exam:   BP 126/60 (BP Location: Right Arm, Patient Position: Sitting, Cuff Size: Large)    Pulse 60    Resp 16    Ht 5' 10"  (1.778 m)    Wt 203 lb (92.1 kg)    SpO2 98% Comment: ON RA   BMI 29.13 kg/m   General:  Elderly,  well-appearing  HEENT:  Unremarkable   Neck:   no JVD, no bruits, no adenopathy   Chest:   clear to auscultation, symmetrical breath sounds, no wheezes, no rhonchi   CV:   RRR, grade III/VI crescendo/decrescendo murmur heard best at RSB,  no diastolic murmur  Abdomen:  soft, non-tender, no masses   Extremities:  warm, well-perfused, pulses diminished but palpable, no LE edema  Rectal/GU  Deferred  Neuro:   Grossly non-focal and symmetrical throughout  Skin:   Clean and dry, no rashes, no breakdown   Diagnostic Tests:  TRANSTHORACIC ECHOCARDIOGRAM  Both images and report from transthoracic echocardiogram performed August 21, 2017 at the [facility] clinic are reviewed.  The patient has severe aortic stenosis with mild left ventricular systolic dysfunction.  Peak velocity across aortic valve measured 4.8 m/s corresponding to mean transvalvular gradient estimated 51.8 mmHg.  The aortic valve has severely thickened and calcified leaflets with extremely  restricted leaflet mobility.  Ejection fraction was estimate 55%, down slightly from last previous echocardiogram.     CORONARY STENT INTERVENTION  RIGHT/LEFT HEART CATH AND CORONARY ANGIOGRAPHY  Conclusion   1. Severe 2 vessel CAD with severe stenosis of a large, dominant RCA and severe stenosis of a small OM branch 2. Severe aortic stenosis with mean gradient 54 mmHg and calculated AVA 0.83  Recommend: DAPT with ASA and clopidogrel x 6 months. Continue multidisciplinary evaluation for TAVR Medical Rx for residual CAD  Indications   Severe aortic stenosis [I35.0 (ICD-10-CM)]  Procedural Details/Technique   Technical Details INDICATION: Severe aortic stenosis - Pre-TAVR  PROCEDURAL DETAILS: There was an indwelling IV in a right antecubital vein. Using normal sterile technique, the IV was changed out for a 5 Fr brachial sheath over a 0.018 inch wire. The right wrist was then prepped, draped, and anesthetized with 1% lidocaine. Using the modified Seldinger technique a 5/6 French Slender sheath was placed in the right radial artery. Intra-arterial verapamil was administered through the radial artery sheath. IV heparin was administered after a JR4 catheter was advanced into the central aorta. A Swan-Ganz catheter was used for the right heart catheterization. Standard protocol was followed for recording of right heart pressures and sampling of oxygen saturations. Fick cardiac output was calculated. Standard Judkins catheters were used for selective coronary angiography. PCI is performed after the diagnostic procedure. There were no immediate procedural complications. The patient was transferred to the post catheterization recovery area for further monitoring.  Estimated blood loss <50 mL.  During this procedure the patient was administered the following to achieve and maintain moderate conscious sedation: Versed 1 mg, Fentanyl 25 mcg, while the patient's heart rate, blood pressure, and  oxygen saturation were continuously monitored.  Coronary Findings   Diagnostic  Dominance: Right  Left Anterior Descending  There is mild diffuse disease throughout the vessel.  Left Circumflex  There is mild diffuse disease throughout the vessel.  First Obtuse Marginal Branch  Vessel is small in size.  Ost 1st Mrg to 1st Mrg lesion 99% stenosed  Ost 1st Mrg to 1st Mrg lesion is 99% stenosed. small vessel, subtotal occlusion, fills late, not suitable for PCI or grafting because of small vessel size  Right Coronary Artery  Prox RCA to Mid RCA lesion 90% stenosed  Prox RCA to Mid RCA lesion is 90% stenosed. diffuse mid-segmental disease  Intervention   Prox RCA to Mid RCA lesion  Stent  Lesion crossed with guidewire using a WIRE COUGAR XT STRL 190CM. Pre-stent angioplasty was performed using a BALLOON SAPPHIRE 2.5X15. A drug-eluting stent was successfully placed using a STENT SYNERGY DES 3.5X38. Post-stent angioplasty was performed using a BALLOON SAPPHIRE [place] [id number]. Maximum pressure: 18 atm. A JR4 guide catheter is used. Heparin is used for anticoagulation and a therapeutic ACT is achieved (307). There is TIMI-3 flow pre-and post PCI.  Post-Intervention Lesion Assessment  The intervention was successful. Pre-interventional TIMI flow is 3. Post-intervention TIMI flow is 3. No complications occurred at this lesion.  There is no residual stenosis post intervention.  Coronary Diagrams   Diagnostic Diagram       Post-Intervention Diagram       Implants    Permanent Stent  Stent Synergy Des 3.5x38 - [id number] - Implanted    Inventory item: STENT SYNERGY DES 3.5X38 Model/Cat number: [id number]  Manufacturer: [employer] Lot number: [id number]  Device identifier: [id number] Device identifier type: GS1  Area Of Implantation: Mid RCA    GUDID Information   Request status Successful    Brand name: SYNERGY Version/Model: [id number]  Company name: [employer] MRI safety info as of 11/06/17: MR Conditional  Contains dry or latex rubber: No    GMDN P.T. name: Drug-eluting coronary artery stent, bioabsorbable-polymer-coated    As of 11/06/2017   Status: Implanted      MERGE Images   Show images for CARDIAC CATHETERIZATION   Link to Procedure Log   Procedure Log    Hemo Data    Most Recent Value  Fick Cardiac Output 6.39 L/min  Fick Cardiac Output Index 2.99 (L/min)/BSA  Aortic Mean Gradient 54.4 mmHg  Aortic Peak Gradient 57 mmHg  Aortic Valve Area 0.83  Aortic Value Area Index 0.39 cm2/BSA  RA A Wave 12 mmHg  RA V Wave 10 mmHg  RA Mean 9 mmHg  RV Systolic Pressure 49 mmHg  RV Diastolic Pressure 6 mmHg  RV EDP 12 mmHg  PA Systolic Pressure 49 mmHg  PA Diastolic Pressure 20 mmHg  PA Mean 31 mmHg  PW A Wave 21 mmHg  PW V Wave 25 mmHg  PW Mean 19 mmHg  AO Systolic Pressure 338 mmHg  AO Diastolic Pressure 57 mmHg  AO Mean 87 mmHg  LV Systolic Pressure 329 mmHg  LV Diastolic Pressure 9 mmHg  LV EDP 19 mmHg  Arterial Occlusion Pressure Extended Systolic Pressure 191 mmHg  Arterial Occlusion Pressure Extended Diastolic Pressure 62 mmHg  Arterial Occlusion Pressure Extended Mean Pressure 97  mmHg  Left Ventricular Apex Extended Systolic Pressure 400 mmHg  Left Ventricular Apex Extended Diastolic Pressure 10 mmHg  Left Ventricular Apex Extended EDP Pressure 31 mmHg  QP/QS 1  TPVR Index 10.39 HRUI  TSVR Index 29.14 HRUI  PVR SVR Ratio 0.15  TPVR/TSVR Ratio 0.36   Cardiac TAVR CT  TECHNIQUE: The patient was scanned on a [employer]. A 120 kV retrospective scan was triggered in the descending thoracic aorta at 111 HU's. Gantry rotation speed was 250 msecs and collimation was .6 mm. No beta blockade or nitro were given. The 3D data set was reconstructed in 5% intervals of the R-R cycle. Systolic and diastolic phases were analyzed on a dedicated work station using MPR, MIP and VRT modes. The  patient received 80 cc of contrast.  FINDINGS: Aortic Valve: Trileaflet aortic valve with severely thickened and calcified leaflets and severely restricted leaflet openings. There are no calcifications extending into the LVOT.  Aorta: Normal size, mild diffuse calcifications, no dissection.  Sinotubular Junction: 26 x 25 mm  Ascending Thoracic Aorta: 30 x 29 mm  Aortic Arch: 23 x 21 mm  Descending Thoracic Aorta: 24 x 22 mm  Sinus of Valsalva Measurements:  Non-coronary: 29 mm  Right -coronary: 31 mm  Left -coronary: 32 mm  Coronary Artery Height above Annulus:  Left Main: 12 mm  Right Coronary: 20 mm  Virtual Basal Annulus Measurements:  Maximum/Minimum Diameter: 27.6 x 24.2 mm  Mean Diameter: 25.2 mm  Perimeter: 80.5 mm  Area: 500 mm2  Optimum Fluoroscopic Angle for Delivery:  LAO 10 CAU 10  IMPRESSION: 1. Trileaflet aortic valve with severely thickened and calcified leaflets and severely restricted leaflet openings. Annular measurements suitable for delivery of a 26 mm Edwards-SAPIEN 3 valve.  2. Sufficient coronary to annulus distance.  3. Optimum Fluoroscopic Angle for Delivery: LAO 10 CAU 10  4. No thrombus in the left atrial appendage.   Electronically Signed   By: Ena Dawley   On: 11/20/2017 17:09   Addended by Dorothy Spark, MD on 11/20/2017 5:12 PM    Study Result   EXAM: OVER-READ INTERPRETATION  CT CHEST  The following report is an over-read performed by radiologist Dr. Vinnie Langton of [facility], [place] on 11/20/2017. This over-read does not include interpretation of cardiac or coronary anatomy or pathology. The coronary calcium score/coronary CTA interpretation by the cardiologist is attached.  COMPARISON:  Chest CT 03/28/2013.  FINDINGS: Extracardiac findings will be described separately under dictation for contemporaneously obtained CTA chest, abdomen and pelvis  dated 11/20/2017.  IMPRESSION: Please see separate dictation for contemporaneously obtained CTA chest, abdomen and pelvis 11/20/2017 for full description of relevant extracardiac findings.  Electronically Signed: By: Vinnie Langton M.D. On: 11/20/2017 16:56       CT ANGIOGRAPHY CHEST, ABDOMEN AND PELVIS  TECHNIQUE: Multidetector CT imaging through the chest, abdomen and pelvis was performed using the standard protocol during bolus administration of intravenous contrast. Multiplanar reconstructed images and MIPs were obtained and reviewed to evaluate the vascular anatomy.  CONTRAST:  37m ISOVUE-370 IOPAMIDOL (ISOVUE-370) INJECTION 76%  COMPARISON:  Chest CT 03/28/2013. CT the abdomen and pelvis 07/17/2014.  FINDINGS: CTA CHEST FINDINGS  Cardiovascular: Heart size is normal. There is no significant pericardial fluid, thickening or pericardial calcification. There is aortic atherosclerosis, as well as atherosclerosis of the great vessels of the mediastinum and the coronary arteries, including calcified atherosclerotic plaque in the left main, left anterior descending and right coronary arteries. Severe calcifications and  thickening of the aortic valve. Lipomatous hypertrophy of the interatrial septum (normal anatomical variant) is incidentally noted.  Mediastinum/Lymph Nodes: Several prominent borderline enlarged mediastinal and hilar lymph nodes are noted measuring up to 1.4 cm in short axis in the right hilar nodal station. Esophagus is unremarkable in appearance. No axillary lymphadenopathy.  Lungs/Pleura: Several tiny 2-3 mm pulmonary nodules are noted in the lungs bilaterally, nonspecific, but statistically likely benign. No larger more suspicious appearing pulmonary nodules or masses are noted. Patchy areas of ground-glass attenuation and mild interlobular septal thickening are noted, most evident throughout the mid to lower lungs, favored to reflect  a background of mild interstitial pulmonary edema. No consolidative airspace disease. No pleural effusions.  Musculoskeletal/Soft Tissues: There are no aggressive appearing lytic or blastic lesions noted in the visualized portions of the skeleton.  CTA ABDOMEN AND PELVIS FINDINGS  Hepatobiliary: In the central aspect of segment 7 of the liver (axial image 79 of series 14) there is a 1.9 x 1.7 cm lesion which is centrally low-attenuation in demonstrates some peripheral nodular enhancement, stable compared to prior study from 2016, most compatible with a cavernous hemangioma. No other suspicious appearing cystic or solid hepatic lesions are noted. No intra or extrahepatic biliary ductal dilatation. Status post cholecystectomy.  Pancreas: No pancreatic mass. No pancreatic ductal dilatation. No pancreatic or peripancreatic fluid or inflammatory changes.  Spleen: Large splenule adjacent to the splenic hilum.  Adrenals/Urinary Tract: Subcentimeter low-attenuation lesion in the lower pole of the right kidney his too small to characterize, but similar to prior study, statistically likely a cyst. Mild atrophy of both kidneys. No hydroureteronephrosis. Urinary bladder is normal in appearance. Bilateral adrenal glands are normal in appearance.  Stomach/Bowel: Normal appearance of the stomach. No pathologic dilatation of small bowel or colon. Numerous colonic diverticulae are noted, without surrounding inflammatory changes to suggest an acute diverticulitis at this time. Normal appendix.  Vascular/Lymphatic: Extensive aortic atherosclerosis without evidence of aneurysm or dissection in the abdominal or pelvic vasculature. Vascular findings and measurements pertinent to potential TAVR procedure, as detailed below. Celiac axis, superior mesenteric artery and inferior mesenteric artery are all widely patent without hemodynamically significant stenosis. Mild stenosis of the dominant  left renal artery. There is also a small accessory branch to the lower pole which appears patent. Single right renal artery is widely patent without hemodynamically significant stenosis. No lymphadenopathy noted in the abdomen or pelvis.  Reproductive: Prostate gland and seminal vesicles are unremarkable in appearance.  Other: No significant volume of ascites.  No pneumoperitoneum.  Musculoskeletal: There are no aggressive appearing lytic or blastic lesions noted in the visualized portions of the skeleton.  VASCULAR MEASUREMENTS PERTINENT TO TAVR:  AORTA:  Minimal Aortic Diameter-10 x 9 mm  Severity of Aortic Calcification-severe  RIGHT PELVIS:  Right Common Iliac Artery -  Minimal Diameter-9.5 x 6.8 mm  Tortuosity-mild  Calcification-moderate  Right External Iliac Artery -  Minimal Diameter-5.8 x 4.3 mm  Tortuosity-severe  Calcification-mild  Right Common Femoral Artery -  Minimal Diameter-6.7 x 5.1 mm  Tortuosity-mild  Calcification-moderate  LEFT PELVIS:  Left Common Iliac Artery -  Minimal Diameter-7.3 x 6.5 mm  Tortuosity-mild  Calcification-moderate  Left External Iliac Artery -  Minimal Diameter-6.1 x 4.0 mm  Tortuosity-moderate  Calcification-mild  Left Common Femoral Artery -  Minimal Diameter-5.9 x 5.8 mm  Tortuosity-mild  Calcification-moderate  Review of the MIP images confirms the above findings.  IMPRESSION: 1. Vascular findings and measurements pertinent to potential TAVR procedure, as detailed above.  2. Severe thickening calcification of the aortic valve, compatible with the reported clinical history of severe aortic stenosis. 3. Findings in the lungs concerning for mild interstitial pulmonary edema. 4. Multiple prominent borderline enlarged mediastinal and hilar lymph nodes. These are nonspecific, but in the setting of possible pulmonary edema are favored to be benign. Further  clinical evaluation to exclude lymphoproliferative disorder should be considered. 5. Aortic atherosclerosis, in addition to left main and 3 vessel coronary artery disease. 6. Colonic diverticulosis without evidence of acute diverticulitis at this time. 7. Cavernous hemangioma in segment 7 of the liver again noted, as above. 8. Status post cholecystectomy. 9. Additional incidental findings, as above.  Aortic Atherosclerosis (ICD10-I70.0).   Electronically Signed   By: Vinnie Langton M.D.   On: 11/21/2017 08:57   Physical therapy evaluation:  Clinical Impression Statement: Pt is an 82yo femalepresenting to OP PT for evaluation prior to possible TAVR surgery due to severe aortic stenosis. Pt reports onset ofdecreased enduranceapproximately 59month ago. Symptoms are limiting ability to perform ADl's. Pt presents withdecreasedROM and strength, andbalance and is at high fall risk 4 stage balance test, decreasedwalking speed and decreasedaerobic endurance per 6 minute walk test. Pt ambulated 2471ft in 3:04before requesting a seated rest beak lasting 1:04. At time of rest, patients HR was88bpm and O2 was 99on room air. Pt reported 2/10 shortness of breath on modified scale for dyspnea. Pt able to resume after rest and ambulate an [id number]. Pt ambulated a total of 381f3fin 6 minute walk. B/Pincreased significantly with 6 minute walk test. Based on the Short Physical Performance Battery, patient has a frailty rating of 3/12 with </= 5/12 considered frail.Patients mobility also effected by vision. Patient needed guidance for gait.   Impression:  Patient has stage D severe symptomatic aortic stenosis.  Although the patient has had relatively few symptoms he does admit to progressive exertional fatigue consistent with New York Heart Association functional class II symptoms of chronic diastolic congestive heart failure.  During his recent physical therapy  evaluation he developed significant exertional shortness of breath ambulating only 240 feet, requiring multiple rest breaks.  I have personally reviewed the patient's recent transthoracic echocardiogram, diagnostic cardiac catheterization, and CT angiograms.  Echocardiogram confirmed the presence of severe aortic stenosis.  The aortic valve appears trileaflet with severe thickening, calcification, and restricted leaflet mobility involving all 3 leaflets of the aortic valve.  Peak velocity across aortic valve measured close to 5 m/s corresponding to mean transvalvular gradient estimated greater than 50 mmHg.  Severe aortic stenosis was confirmed at the time of catheterization where mean transvalvular gradient was measured 54 mmHg across aortic valve.  Catheterization also revealed severe single-vessel coronary artery disease but the patient was treated successfully with PCI and stenting of the right coronary artery.  I agree the patient would benefit from elective aortic valve replacement.  I would be very reluctant to consider this patient a candidate for conventional surgery because of his advanced age and considerable comorbidities with limited physical mobility, blindness, frailty, and some degree of mild cognitive dysfunction.  Cardiac-gated CTA of the heart reveals anatomical characteristics consistent with aortic stenosis suitable for treatment by transcatheter aortic valve replacement without any significant complicating features and CTA of the aorta and iliac vessels demonstrate what appears to be adequate pelvic vascular access to facilitate a transfemoral approach.    Plan:  The patient and his daughter and caregiver were counseled at length regarding treatment alternatives for management of severe symptomatic aortic stenosis. Alternative approaches  such as conventional aortic valve replacement, transcatheter aortic valve replacement, and continued medical therapy without intervention were compared  and contrasted at length.  The risks associated with conventional surgical aortic valve replacement were discussed in detail, as were expectations for post-operative convalescence, and why I would be reluctant to consider this patient a candidate for conventional surgery.  Issues specific to transcatheter aortic valve replacement were discussed including questions about long term valve durability, the potential for paravalvular leak, possible increased risk of need for permanent pacemaker placement, and other technical complications related to the procedure itself.  Long-term prognosis with medical therapy was discussed. This discussion was placed in the context of the patient's own specific clinical presentation and past medical history.  All of their questions have been addressed.  The patient desires to proceed with transcatheter aortic valve replacement as previously scheduled.  Following the decision to proceed with transcatheter aortic valve replacement, a discussion has been held regarding what types of management strategies would be attempted intraoperatively in the event of life-threatening complications, including whether or not the patient would be considered a candidate for the use of cardiopulmonary bypass and/or conversion to open sternotomy for attempted surgical intervention.  The patient has been advised of a variety of complications that might develop including but not limited to risks of death, stroke, paravalvular leak, aortic dissection or other major vascular complications, aortic annulus rupture, device embolization, cardiac rupture or perforation, mitral regurgitation, acute myocardial infarction, arrhythmia, heart block or bradycardia requiring permanent pacemaker placement, congestive heart failure, respiratory failure, renal failure, pneumonia, infection, other late complications related to structural valve deterioration or migration, or other complications that might ultimately cause a  temporary or permanent loss of functional independence or other long term morbidity.  The patient provides full informed consent for the procedure as described and all questions were answered.  I spent in excess of 90 minutes during the conduct of this office consultation and >50% of this time involved direct face-to-face encounter with the patient for counseling and/or coordination of their care.           Level 3 Office Consult = 40 minutes         Level 4 Office Consult = 60 minutes         Level 5 Office Consult = 80 minutes     Chad Rollins H. Roxy Manns, MD 12/04/2017 10:38 AM

## 2017-12-05 ENCOUNTER — Inpatient Hospital Stay (HOSPITAL_COMMUNITY): Payer: PPO

## 2017-12-05 ENCOUNTER — Inpatient Hospital Stay (HOSPITAL_COMMUNITY)
Admission: RE | Admit: 2017-12-05 | Discharge: 2017-12-08 | DRG: 267 | Disposition: A | Discharge status: Home / Self Care | Payer: PPO | Attending: Cardiovascular Disease | Admitting: Cardiovascular Disease

## 2017-12-05 ENCOUNTER — Encounter (HOSPITAL_COMMUNITY): Payer: Self-pay

## 2017-12-05 ENCOUNTER — Inpatient Hospital Stay (HOSPITAL_COMMUNITY): Payer: PPO | Admitting: Certified Registered Nurse Anesthetist

## 2017-12-05 ENCOUNTER — Encounter (HOSPITAL_COMMUNITY)
Admission: RE | Disposition: A | Discharge status: Home / Self Care | Payer: Self-pay | Source: Home / Self Care | Attending: Cardiovascular Disease

## 2017-12-05 ENCOUNTER — Ambulatory Visit (HOSPITAL_COMMUNITY): Payer: PPO

## 2017-12-05 ENCOUNTER — Inpatient Hospital Stay (HOSPITAL_COMMUNITY): Payer: PPO | Admitting: Vascular Surgery

## 2017-12-05 DIAGNOSIS — M069 Rheumatoid arthritis, unspecified: Secondary | ICD-10-CM | POA: Diagnosis present

## 2017-12-05 DIAGNOSIS — I5032 Chronic diastolic (congestive) heart failure: Secondary | ICD-10-CM | POA: Diagnosis present

## 2017-12-05 DIAGNOSIS — Z952 Presence of prosthetic heart valve: Secondary | ICD-10-CM

## 2017-12-05 DIAGNOSIS — Z7902 Long term (current) use of antithrombotics/antiplatelets: Secondary | ICD-10-CM | POA: Diagnosis not present

## 2017-12-05 DIAGNOSIS — Z8249 Family history of ischemic heart disease and other diseases of the circulatory system: Secondary | ICD-10-CM

## 2017-12-05 DIAGNOSIS — E039 Hypothyroidism, unspecified: Secondary | ICD-10-CM | POA: Diagnosis present

## 2017-12-05 DIAGNOSIS — I447 Left bundle-branch block, unspecified: Secondary | ICD-10-CM | POA: Diagnosis present

## 2017-12-05 DIAGNOSIS — I25118 Atherosclerotic heart disease of native coronary artery with other forms of angina pectoris: Secondary | ICD-10-CM | POA: Diagnosis not present

## 2017-12-05 DIAGNOSIS — Z006 Encounter for examination for normal comparison and control in clinical research program: Secondary | ICD-10-CM

## 2017-12-05 DIAGNOSIS — Z8673 Personal history of transient ischemic attack (TIA), and cerebral infarction without residual deficits: Secondary | ICD-10-CM

## 2017-12-05 DIAGNOSIS — M1990 Unspecified osteoarthritis, unspecified site: Secondary | ICD-10-CM | POA: Diagnosis present

## 2017-12-05 DIAGNOSIS — I38 Endocarditis, valve unspecified: Secondary | ICD-10-CM | POA: Diagnosis present

## 2017-12-05 DIAGNOSIS — I2510 Atherosclerotic heart disease of native coronary artery without angina pectoris: Secondary | ICD-10-CM | POA: Diagnosis present

## 2017-12-05 DIAGNOSIS — N138 Other obstructive and reflux uropathy: Secondary | ICD-10-CM | POA: Diagnosis present

## 2017-12-05 DIAGNOSIS — Z87891 Personal history of nicotine dependence: Secondary | ICD-10-CM

## 2017-12-05 DIAGNOSIS — N401 Enlarged prostate with lower urinary tract symptoms: Secondary | ICD-10-CM | POA: Diagnosis present

## 2017-12-05 DIAGNOSIS — Z886 Allergy status to analgesic agent status: Secondary | ICD-10-CM

## 2017-12-05 DIAGNOSIS — E785 Hyperlipidemia, unspecified: Secondary | ICD-10-CM | POA: Diagnosis present

## 2017-12-05 DIAGNOSIS — Z79899 Other long term (current) drug therapy: Secondary | ICD-10-CM

## 2017-12-05 DIAGNOSIS — H538 Other visual disturbances: Secondary | ICD-10-CM | POA: Diagnosis present

## 2017-12-05 DIAGNOSIS — Z7984 Long term (current) use of oral hypoglycemic drugs: Secondary | ICD-10-CM | POA: Diagnosis not present

## 2017-12-05 DIAGNOSIS — E1142 Type 2 diabetes mellitus with diabetic polyneuropathy: Secondary | ICD-10-CM | POA: Diagnosis present

## 2017-12-05 DIAGNOSIS — I350 Nonrheumatic aortic (valve) stenosis: Secondary | ICD-10-CM

## 2017-12-05 DIAGNOSIS — I69398 Other sequelae of cerebral infarction: Secondary | ICD-10-CM

## 2017-12-05 DIAGNOSIS — K219 Gastro-esophageal reflux disease without esophagitis: Secondary | ICD-10-CM | POA: Diagnosis present

## 2017-12-05 DIAGNOSIS — I110 Hypertensive heart disease with heart failure: Secondary | ICD-10-CM | POA: Diagnosis present

## 2017-12-05 DIAGNOSIS — Z955 Presence of coronary angioplasty implant and graft: Secondary | ICD-10-CM

## 2017-12-05 DIAGNOSIS — Z7982 Long term (current) use of aspirin: Secondary | ICD-10-CM

## 2017-12-05 DIAGNOSIS — H548 Legal blindness, as defined in USA: Secondary | ICD-10-CM | POA: Diagnosis present

## 2017-12-05 DIAGNOSIS — Z954 Presence of other heart-valve replacement: Secondary | ICD-10-CM | POA: Diagnosis not present

## 2017-12-05 DIAGNOSIS — E119 Type 2 diabetes mellitus without complications: Secondary | ICD-10-CM

## 2017-12-05 DIAGNOSIS — I10 Essential (primary) hypertension: Secondary | ICD-10-CM | POA: Diagnosis present

## 2017-12-05 HISTORY — PX: TRANSCATHETER AORTIC VALVE REPLACEMENT, TRANSFEMORAL: SHX6400

## 2017-12-05 HISTORY — DX: Nonrheumatic aortic (valve) stenosis: I35.0

## 2017-12-05 HISTORY — PX: INTRAOPERATIVE TRANSTHORACIC ECHOCARDIOGRAM: SHX6523

## 2017-12-05 LAB — CBC
HEMATOCRIT: 33.2 % — AB (ref 39.0–52.0)
Hemoglobin: 11 g/dL — ABNORMAL LOW (ref 13.0–17.0)
MCH: 30.6 pg (ref 26.0–34.0)
MCHC: 33.1 g/dL (ref 30.0–36.0)
MCV: 92.5 fL (ref 78.0–100.0)
Platelets: 117 10*3/uL — ABNORMAL LOW (ref 150–400)
RBC: 3.59 MIL/uL — ABNORMAL LOW (ref 4.22–5.81)
RDW: 13.3 % (ref 11.5–15.5)
WBC: 12.6 10*3/uL — AB (ref 4.0–10.5)

## 2017-12-05 LAB — POCT I-STAT, CHEM 8
BUN: 28 mg/dL — ABNORMAL HIGH (ref 6–20)
BUN: 29 mg/dL — ABNORMAL HIGH (ref 6–20)
CHLORIDE: 105 mmol/L (ref 101–111)
Calcium, Ion: 1.17 mmol/L (ref 1.15–1.40)
Calcium, Ion: 1.2 mmol/L (ref 1.15–1.40)
Chloride: 105 mmol/L (ref 101–111)
Creatinine, Ser: 1.3 mg/dL — ABNORMAL HIGH (ref 0.61–1.24)
Creatinine, Ser: 1.1 mg/dL (ref 0.61–1.24)
Glucose, Bld: 156 mg/dL — ABNORMAL HIGH (ref 65–99)
Glucose, Bld: 199 mg/dL — ABNORMAL HIGH (ref 65–99)
HEMATOCRIT: 32 % — AB (ref 39.0–52.0)
HEMATOCRIT: 27 % — AB (ref 39.0–52.0)
HEMOGLOBIN: 10.9 g/dL — AB (ref 13.0–17.0)
HEMOGLOBIN: 9.2 g/dL — AB (ref 13.0–17.0)
POTASSIUM: 3.9 mmol/L (ref 3.5–5.1)
POTASSIUM: 4 mmol/L (ref 3.5–5.1)
SODIUM: 144 mmol/L (ref 135–145)
SODIUM: 143 mmol/L (ref 135–145)
TCO2: 26 mmol/L (ref 22–32)
TCO2: 26 mmol/L (ref 22–32)

## 2017-12-05 LAB — GLUCOSE, CAPILLARY
GLUCOSE-CAPILLARY: 173 mg/dL — AB (ref 65–99)
GLUCOSE-CAPILLARY: 118 mg/dL — AB (ref 65–99)
Glucose-Capillary: 157 mg/dL — ABNORMAL HIGH (ref 65–99)

## 2017-12-05 LAB — BASIC METABOLIC PANEL
ANION GAP: 9 (ref 5–15)
BUN: 29 mg/dL — ABNORMAL HIGH (ref 6–20)
CALCIUM: 8.2 mg/dL — AB (ref 8.9–10.3)
CHLORIDE: 109 mmol/L (ref 101–111)
CO2: 23 mmol/L (ref 22–32)
Creatinine, Ser: 1.29 mg/dL — ABNORMAL HIGH (ref 0.61–1.24)
GFR calc Af Amer: 59 mL/min — ABNORMAL LOW (ref >60)
GFR calc non Af Amer: 51 mL/min — ABNORMAL LOW (ref >60)
GLUCOSE: 198 mg/dL — AB (ref 65–99)
Potassium: 4 mmol/L (ref 3.5–5.1)
Sodium: 141 mmol/L (ref 135–145)

## 2017-12-05 LAB — PROTIME-INR
INR: 1.22
Prothrombin Time: 15.3 seconds — ABNORMAL HIGH (ref 11.4–15.2)

## 2017-12-05 SURGERY — IMPLANTATION, AORTIC VALVE, TRANSCATHETER, FEMORAL APPROACH
Anesthesia: Monitor Anesthesia Care | Site: Chest

## 2017-12-05 MED ORDER — METOPROLOL TARTRATE 5 MG/5ML IV SOLN
2.5000 mg | INTRAVENOUS | Status: DC | PRN
  Administered 2017-12-06 – 2017-12-07 (×2): 5 mg via INTRAVENOUS
  Filled 2017-12-05 (×2): qty 5

## 2017-12-05 MED ORDER — PROTAMINE SULFATE 10 MG/ML IV SOLN
INTRAVENOUS | Status: DC | PRN
  Administered 2017-12-05: 10 mg via INTRAVENOUS
  Administered 2017-12-05: 130 mg via INTRAVENOUS

## 2017-12-05 MED ORDER — FENTANYL CITRATE (PF) 100 MCG/2ML IJ SOLN
50.0000 ug | Freq: Once | INTRAMUSCULAR | Status: AC
  Administered 2017-12-05: 50 ug via INTRAVENOUS

## 2017-12-05 MED ORDER — SODIUM CHLORIDE 0.9 % IV SOLN
INTRAVENOUS | Status: AC
  Filled 2017-12-05 (×3): qty 1.2

## 2017-12-05 MED ORDER — DEXTROSE 5 % IV SOLN
INTRAVENOUS | Status: DC | PRN
  Administered 2017-12-05: 2 ug/min via INTRAVENOUS

## 2017-12-05 MED ORDER — DORZOLAMIDE HCL-TIMOLOL MAL 2-0.5 % OP SOLN
1.0000 [drp] | Freq: Two times a day (BID) | OPHTHALMIC | Status: DC
  Administered 2017-12-05 – 2017-12-08 (×6): 1 [drp] via OPHTHALMIC
  Filled 2017-12-05: qty 10

## 2017-12-05 MED ORDER — SODIUM CHLORIDE 0.9 % IV SOLN
1.5000 g | Freq: Two times a day (BID) | INTRAVENOUS | Status: AC
  Administered 2017-12-05 – 2017-12-07 (×4): 1.5 g via INTRAVENOUS
  Filled 2017-12-05 (×5): qty 1.5

## 2017-12-05 MED ORDER — CHLORHEXIDINE GLUCONATE 4 % EX LIQD: 60.0000 mL | Freq: Once | CUTANEOUS | Status: DC

## 2017-12-05 MED ORDER — TAMSULOSIN HCL 0.4 MG PO CAPS
0.4000 mg | ORAL_CAPSULE | Freq: Every day | ORAL | Status: DC
  Administered 2017-12-05 – 2017-12-07 (×3): 0.4 mg via ORAL
  Filled 2017-12-05 (×3): qty 1

## 2017-12-05 MED ORDER — LATANOPROST 0.005 % OP SOLN
1.0000 [drp] | Freq: Every day | OPHTHALMIC | Status: DC
  Administered 2017-12-05 – 2017-12-07 (×3): 1 [drp] via OPHTHALMIC
  Filled 2017-12-05: qty 2.5

## 2017-12-05 MED ORDER — OXYCODONE HCL 5 MG PO TABS
5.0000 mg | ORAL_TABLET | ORAL | Status: DC | PRN
  Administered 2017-12-05: 5 mg via ORAL
  Filled 2017-12-05: qty 1

## 2017-12-05 MED ORDER — SODIUM CHLORIDE 0.9 % IV SOLN: INTRAVENOUS | Status: DC

## 2017-12-05 MED ORDER — VITAMIN B-12 1000 MCG PO TABS
1000.0000 ug | ORAL_TABLET | Freq: Every day | ORAL | Status: DC
  Administered 2017-12-05 – 2017-12-07 (×3): 1000 ug via ORAL
  Filled 2017-12-05 (×3): qty 1

## 2017-12-05 MED ORDER — LIDOCAINE HCL (PF) 1 % IJ SOLN
INTRAMUSCULAR | Status: AC
  Filled 2017-12-05: qty 30

## 2017-12-05 MED ORDER — BRIMONIDINE TARTRATE 0.2 % OP SOLN
1.0000 [drp] | Freq: Two times a day (BID) | OPHTHALMIC | Status: DC
  Administered 2017-12-05 – 2017-12-08 (×6): 1 [drp] via OPHTHALMIC
  Filled 2017-12-05: qty 5

## 2017-12-05 MED ORDER — ASPIRIN EC 81 MG PO TBEC
81.0000 mg | DELAYED_RELEASE_TABLET | Freq: Every day | ORAL | Status: DC
  Administered 2017-12-05 – 2017-12-08 (×4): 81 mg via ORAL
  Filled 2017-12-05 (×4): qty 1

## 2017-12-05 MED ORDER — SODIUM CHLORIDE 0.9 % IV SOLN
INTRAVENOUS | Status: DC | PRN
  Administered 2017-12-05: 1500 mL

## 2017-12-05 MED ORDER — LEVOTHYROXINE SODIUM 75 MCG PO TABS
75.0000 ug | ORAL_TABLET | Freq: Every day | ORAL | Status: DC
  Administered 2017-12-06 – 2017-12-08 (×3): 75 ug via ORAL
  Filled 2017-12-05 (×3): qty 1

## 2017-12-05 MED ORDER — ALBUMIN HUMAN 5 % IV SOLN: 250.0000 mL | INTRAVENOUS | Status: DC | PRN

## 2017-12-05 MED ORDER — PANTOPRAZOLE SODIUM 40 MG PO TBEC: 40.0000 mg | DELAYED_RELEASE_TABLET | Freq: Every day | ORAL | Status: DC

## 2017-12-05 MED ORDER — VANCOMYCIN HCL IN DEXTROSE 1-5 GM/200ML-% IV SOLN
1000.0000 mg | Freq: Once | INTRAVENOUS | Status: AC
  Administered 2017-12-06: 1000 mg via INTRAVENOUS
  Filled 2017-12-05: qty 200

## 2017-12-05 MED ORDER — ONDANSETRON HCL 4 MG/2ML IJ SOLN: 4.0000 mg | Freq: Once | INTRAMUSCULAR | Status: DC | PRN

## 2017-12-05 MED ORDER — LACTATED RINGERS IV SOLN
INTRAVENOUS | Status: DC | PRN
Start: 2017-12-05 — End: 2017-12-05
  Administered 2017-12-05: 14:00:00 via INTRAVENOUS

## 2017-12-05 MED ORDER — CLOPIDOGREL BISULFATE 75 MG PO TABS
75.0000 mg | ORAL_TABLET | Freq: Every day | ORAL | Status: DC
  Administered 2017-12-05 – 2017-12-07 (×3): 75 mg via ORAL
  Filled 2017-12-05 (×3): qty 1

## 2017-12-05 MED ORDER — LACTATED RINGERS IV SOLN
INTRAVENOUS | Status: DC | PRN
  Administered 2017-12-05: 14:00:00 via INTRAVENOUS

## 2017-12-05 MED ORDER — LACTATED RINGERS IV SOLN: 500.0000 mL | Freq: Once | INTRAVENOUS | Status: DC | PRN

## 2017-12-05 MED ORDER — NITROGLYCERIN IN D5W 200-5 MCG/ML-% IV SOLN: 0.0000 ug/min | INTRAVENOUS | Status: DC

## 2017-12-05 MED ORDER — LIDOCAINE HCL 1 % IJ SOLN
INTRAMUSCULAR | Status: DC | PRN
  Administered 2017-12-05: 10 mL

## 2017-12-05 MED ORDER — ATORVASTATIN CALCIUM 20 MG PO TABS
20.0000 mg | ORAL_TABLET | Freq: Every day | ORAL | Status: DC
Start: 2017-12-05 — End: 2017-12-08
  Administered 2017-12-05 – 2017-12-07 (×3): 20 mg via ORAL
  Filled 2017-12-05 (×3): qty 1

## 2017-12-05 MED ORDER — SODIUM CHLORIDE 0.9 % IV SOLN
INTRAVENOUS | Status: DC
  Administered 2017-12-05: 11:00:00 via INTRAVENOUS

## 2017-12-05 MED ORDER — IODIXANOL 320 MG/ML IV SOLN
INTRAVENOUS | Status: DC | PRN
  Administered 2017-12-05: 28.5 mL via INTRAVENOUS

## 2017-12-05 MED ORDER — TRAMADOL HCL 50 MG PO TABS
50.0000 mg | ORAL_TABLET | ORAL | Status: DC | PRN
  Administered 2017-12-06: 100 mg via ORAL
  Filled 2017-12-05: qty 2

## 2017-12-05 MED ORDER — FENTANYL CITRATE (PF) 100 MCG/2ML IJ SOLN
INTRAMUSCULAR | Status: AC
  Administered 2017-12-05: 50 ug via INTRAVENOUS
  Filled 2017-12-05: qty 2

## 2017-12-05 MED ORDER — SUGAMMADEX SODIUM 200 MG/2ML IV SOLN
INTRAVENOUS | Status: AC
  Filled 2017-12-05: qty 2

## 2017-12-05 MED ORDER — SODIUM CHLORIDE 0.9 % IV SOLN
0.0000 ug/min | INTRAVENOUS | Status: DC
  Filled 2017-12-05: qty 2

## 2017-12-05 MED ORDER — MORPHINE SULFATE (PF) 2 MG/ML IV SOLN: 2.0000 mg | INTRAVENOUS | Status: DC | PRN

## 2017-12-05 MED ORDER — MELATONIN 5 MG PO TABS: 5.0000 mg | ORAL_TABLET | Freq: Every day | ORAL | Status: DC

## 2017-12-05 MED ORDER — ONDANSETRON HCL 4 MG/2ML IJ SOLN
4.0000 mg | Freq: Four times a day (QID) | INTRAMUSCULAR | Status: DC | PRN
  Filled 2017-12-05: qty 2

## 2017-12-05 MED ORDER — MIDAZOLAM HCL 2 MG/2ML IJ SOLN
INTRAMUSCULAR | Status: AC
  Filled 2017-12-05: qty 2

## 2017-12-05 MED ORDER — BUSPIRONE HCL 15 MG PO TABS
15.0000 mg | ORAL_TABLET | Freq: Two times a day (BID) | ORAL | Status: DC
  Administered 2017-12-05 – 2017-12-08 (×6): 15 mg via ORAL
  Filled 2017-12-05 (×6): qty 1

## 2017-12-05 MED ORDER — HEPARIN SODIUM (PORCINE) 1000 UNIT/ML IJ SOLN
INTRAMUSCULAR | Status: AC
  Filled 2017-12-05: qty 2

## 2017-12-05 MED ORDER — MIDAZOLAM HCL 2 MG/2ML IJ SOLN: 2.0000 mg | INTRAMUSCULAR | Status: DC | PRN

## 2017-12-05 MED ORDER — FENTANYL CITRATE (PF) 250 MCG/5ML IJ SOLN
INTRAMUSCULAR | Status: AC
  Filled 2017-12-05: qty 5

## 2017-12-05 MED ORDER — PANTOPRAZOLE SODIUM 40 MG PO TBEC
40.0000 mg | DELAYED_RELEASE_TABLET | Freq: Every day | ORAL | Status: DC
  Administered 2017-12-05 – 2017-12-08 (×4): 40 mg via ORAL
  Filled 2017-12-05 (×4): qty 1

## 2017-12-05 MED ORDER — CHLORHEXIDINE GLUCONATE 0.12 % MT SOLN
15.0000 mL | Freq: Once | OROMUCOSAL | Status: AC
  Administered 2017-12-05: 15 mL via OROMUCOSAL
  Filled 2017-12-05: qty 15

## 2017-12-05 MED ORDER — INSULIN ASPART 100 UNIT/ML ~~LOC~~ SOLN
0.0000 [IU] | Freq: Three times a day (TID) | SUBCUTANEOUS | Status: DC
  Administered 2017-12-05 – 2017-12-06 (×3): 3 [IU] via SUBCUTANEOUS
  Administered 2017-12-06 – 2017-12-07 (×2): 8 [IU] via SUBCUTANEOUS
  Administered 2017-12-07 (×2): 3 [IU] via SUBCUTANEOUS
  Administered 2017-12-08: 11 [IU] via SUBCUTANEOUS
  Administered 2017-12-08: 2 [IU] via SUBCUTANEOUS

## 2017-12-05 MED ORDER — FINASTERIDE 5 MG PO TABS
5.0000 mg | ORAL_TABLET | Freq: Every day | ORAL | Status: DC
  Administered 2017-12-05 – 2017-12-08 (×4): 5 mg via ORAL
  Filled 2017-12-05 (×4): qty 1

## 2017-12-05 MED ORDER — PROPOFOL 500 MG/50ML IV EMUL
INTRAVENOUS | Status: DC | PRN
  Administered 2017-12-05: 20 ug/kg/min via INTRAVENOUS

## 2017-12-05 MED ORDER — PROTAMINE SULFATE 10 MG/ML IV SOLN
INTRAVENOUS | Status: AC
Start: 2017-12-05 — End: ?
  Filled 2017-12-05: qty 50

## 2017-12-05 MED ORDER — CHLORHEXIDINE GLUCONATE 4 % EX LIQD: 30.0000 mL | CUTANEOUS | Status: DC

## 2017-12-05 MED ORDER — PROPOFOL 10 MG/ML IV BOLUS
INTRAVENOUS | Status: DC | PRN
  Administered 2017-12-05: 10 mg via INTRAVENOUS

## 2017-12-05 MED ORDER — FENTANYL CITRATE (PF) 100 MCG/2ML IJ SOLN: 25.0000 ug | INTRAMUSCULAR | Status: DC | PRN

## 2017-12-05 MED ORDER — MELATONIN 3 MG PO TABS
3.0000 mg | ORAL_TABLET | Freq: Every day | ORAL | Status: DC
  Administered 2017-12-06 – 2017-12-07 (×2): 3 mg via ORAL
  Filled 2017-12-05 (×3): qty 1

## 2017-12-05 MED ORDER — HEPARIN SODIUM (PORCINE) 1000 UNIT/ML IJ SOLN
INTRAMUSCULAR | Status: DC | PRN
  Administered 2017-12-05: 14000 [IU] via INTRAVENOUS

## 2017-12-05 SURGICAL SUPPLY — 59 items
BAG DECANTER FOR FLEXI CONT (MISCELLANEOUS) ×4 IMPLANT
BAG SNAP BAND KOVER 36X36 (MISCELLANEOUS) ×4 IMPLANT
BLADE CLIPPER SURG (BLADE) IMPLANT
BLADE STERNUM SYSTEM 6 (BLADE) IMPLANT
CABLE ADAPT CONN TEMP 6FT (ADAPTER) ×4 IMPLANT
CANISTER SUCT 3000ML PPV (MISCELLANEOUS) IMPLANT
CATH DIAG EXPO 6F VENT PIG 145 (CATHETERS) ×8 IMPLANT
CATH INFINITI 6F AL2 (CATHETERS) ×4 IMPLANT
CATH S G BIP PACING (SET/KITS/TRAYS/PACK) ×4 IMPLANT
CONT SPEC 4OZ CLIKSEAL STRL BL (MISCELLANEOUS) ×8 IMPLANT
COVER BACK TABLE 80X110 HD (DRAPES) ×4 IMPLANT
CRADLE DONUT ADULT HEAD (MISCELLANEOUS) ×4 IMPLANT
DERMABOND ADHESIVE PROPEN (GAUZE/BANDAGES/DRESSINGS) ×2
DERMABOND ADVANCED (GAUZE/BANDAGES/DRESSINGS) ×2
DERMABOND ADVANCED .7 DNX12 (GAUZE/BANDAGES/DRESSINGS) ×2 IMPLANT
DERMABOND ADVANCED .7 DNX6 (GAUZE/BANDAGES/DRESSINGS) ×2 IMPLANT
DEVICE CLOSURE PERCLS PRGLD 6F (VASCULAR PRODUCTS) ×6 IMPLANT
DRSG TEGADERM 4X4.75 (GAUZE/BANDAGES/DRESSINGS) ×8 IMPLANT
ELECT REM PT RETURN 9FT ADLT (ELECTROSURGICAL) ×8
ELECTRODE REM PT RTRN 9FT ADLT (ELECTROSURGICAL) ×4 IMPLANT
GAUZE SPONGE 4X4 12PLY STRL (GAUZE/BANDAGES/DRESSINGS) ×8 IMPLANT
GLOVE BIO SURGEON STRL SZ7.5 (GLOVE) IMPLANT
GLOVE BIO SURGEON STRL SZ8 (GLOVE) IMPLANT
GLOVE EUDERMIC 7 POWDERFREE (GLOVE) IMPLANT
GLOVE ORTHO TXT STRL SZ7.5 (GLOVE) IMPLANT
GOWN STRL REUS W/ TWL LRG LVL3 (GOWN DISPOSABLE) ×12 IMPLANT
GOWN STRL REUS W/ TWL XL LVL3 (GOWN DISPOSABLE) ×2 IMPLANT
GOWN STRL REUS W/TWL LRG LVL3 (GOWN DISPOSABLE) ×12
GOWN STRL REUS W/TWL XL LVL3 (GOWN DISPOSABLE) ×2
GUIDEWIRE SAF TJ AMPL .035X180 (WIRE) ×4 IMPLANT
GUIDEWIRE SAFE TJ AMPLATZ EXST (WIRE) ×4 IMPLANT
GUIDEWIRE STRAIGHT .035 260CM (WIRE) ×4 IMPLANT
INSERT FOGARTY SM (MISCELLANEOUS) IMPLANT
KIT BASIN OR (CUSTOM PROCEDURE TRAY) ×4 IMPLANT
KIT HEART LEFT (KITS) ×4 IMPLANT
KIT TURNOVER KIT B (KITS) ×4 IMPLANT
NEEDLE PERC 18GX7CM (NEEDLE) ×4 IMPLANT
NS IRRIG 1000ML POUR BTL (IV SOLUTION) ×12 IMPLANT
PACK ENDOVASCULAR (PACKS) ×4 IMPLANT
PAD ARMBOARD 7.5X6 YLW CONV (MISCELLANEOUS) ×8 IMPLANT
PAD ELECT DEFIB RADIOL ZOLL (MISCELLANEOUS) ×4 IMPLANT
PENCIL BUTTON HOLSTER BLD 10FT (ELECTRODE) ×4 IMPLANT
PERCLOSE PROGLIDE 6F (VASCULAR PRODUCTS) ×12
SET MICROPUNCTURE 5F STIFF (MISCELLANEOUS) ×4 IMPLANT
SHEATH BRITE TIP 6FR 35CM (SHEATH) ×4 IMPLANT
SHEATH PINNACLE 6F 10CM (SHEATH) IMPLANT
SHEATH PINNACLE 8F 10CM (SHEATH) ×4 IMPLANT
SLEEVE REPOSITIONING LENGTH 30 (MISCELLANEOUS) ×4 IMPLANT
STOPCOCK MORSE 400PSI 3WAY (MISCELLANEOUS) ×8 IMPLANT
SUT SILK  1 MH (SUTURE) ×2
SUT SILK 1 MH (SUTURE) ×2 IMPLANT
SYR 50ML LL SCALE MARK (SYRINGE) ×4 IMPLANT
SYR BULB IRRIGATION 50ML (SYRINGE) IMPLANT
SYR CONTROL 10ML LL (SYRINGE) IMPLANT
TOWEL GREEN STERILE (TOWEL DISPOSABLE) ×8 IMPLANT
TRANSDUCER W/STOPCOCK (MISCELLANEOUS) ×8 IMPLANT
VALVE HEART TRANSCATH SZ3 26MM (Prosthesis & Implant Heart) ×4 IMPLANT
WIRE .035 3MM-J 145CM (WIRE) ×4 IMPLANT
WIRE BENTSON .035X145CM (WIRE) ×4 IMPLANT

## 2017-12-05 NOTE — Anesthesia Procedure Notes (Signed)
Central Venous Catheter Insertion Performed by: Catalina Gravel, MD, anesthesiologist Start/End6/10/2017 11:07 AM, 12/05/2017 11:17 AM Patient location: Pre-op. Preanesthetic checklist: patient identified, IV checked, site marked, risks and benefits discussed, surgical consent, monitors and equipment checked, pre-op evaluation, timeout performed and anesthesia consent Position: Trendelenburg Lidocaine 1% used for infiltration and patient sedated Hand hygiene performed , maximum sterile barriers used  and Seldinger technique used Catheter size: 8 Fr Total catheter length 16. Central line was placed.Double lumen Procedure performed using ultrasound guided technique. Ultrasound Notes:anatomy identified, needle tip was noted to be adjacent to the nerve/plexus identified, no ultrasound evidence of intravascular and/or intraneural injection and image(s) printed for medical record Attempts: 1 Following insertion, dressing applied, line sutured and Biopatch. Post procedure assessment: blood return through all ports  Patient tolerated the procedure well with no immediate complications. Additional procedure comments: Required additional suture due to bleeding into the dressing. Patient thrombocytopenic and on blood thinners.Marland Kitchen

## 2017-12-05 NOTE — Anesthesia Procedure Notes (Signed)
Arterial Line Insertion Start/End6/10/2017 11:10 AM Performed by: White, Amedeo Plenty, Immunologist, CRNA  Patient location: Pre-op. Lidocaine 1% used for infiltration Right, radial was placed Catheter size: 20 G Hand hygiene performed  and maximum sterile barriers used  Allen's test indicative of satisfactory collateral circulation Attempts: 1 Procedure performed without using ultrasound guided technique. Following insertion, dressing applied and Biopatch. Post procedure assessment: normal  Patient tolerated the procedure well with no immediate complications.

## 2017-12-05 NOTE — Anesthesia Procedure Notes (Addendum)
Procedure Name: MAC Date/Time: 12/05/2017 2:00 PM Performed by: Catalina Gravel, MD Oxygen Delivery Method: Simple face mask Placement Confirmation: positive ETCO2

## 2017-12-05 NOTE — Progress Notes (Signed)
Cards fellow paged regarding patient urge to void but no results. Verbal order to do in and out cath if needed. Will try to stand patient to use urinal. If no results, will in and out cath.

## 2017-12-05 NOTE — Progress Notes (Addendum)
  [place] VALVE TEAM  Patient doing well s/p TAVR. He is hemodynamically stable. Groin sites stable. Tele with no high grade block. Plan to DC arterial line. Early ambulation and plan to transfer to tele tomorrow.   Angelena Form PA-C  [facility]  Pager [phone]

## 2017-12-05 NOTE — Progress Notes (Signed)
Patient complained of increased bladder pressure. In and out cath completed per verbal order from cards fellow. Patient felt significant relief. Patient wanted to attempt to get up since bedrest is over. Before getting up, patient had a vagal response. C/o nausea while head of the bed raised, HR 40s, BP 69/43 cuff and aline pressure dropped, clammy skin. Cards paged, present at bedside. EKG performed at bedside. No changes with EKG. Patient recovered with no intervention. Verbal order to keep arterial line overnight per Christell Constant, MD.  Will continue to monitor patient.

## 2017-12-05 NOTE — Op Note (Signed)
[facility]   MULTIDISCIPLINARY HEART VALVE TEAM   TAVR OPERATIVE NOTE   Date of Procedure:  12/05/2017  Preoperative Diagnosis: Severe Aortic Stenosis   Postoperative Diagnosis: Same   Procedure:    Transcatheter Aortic Valve Replacement - Percutaneous Right Transfemoral Approach  Edwards Sapien 3 THV (size 26 mm, model # 9600TFX, serial # [id number])   Co-Surgeons:  Gaye Pollack, MD and Sherren Mocha, MD  Anesthesiologist:  Everardo Beals, MD  Echocardiographer:  Ena Dawley, MD  Pre-operative Echo Findings:  Severe aortic stenosis  Normal left ventricular systolic function  Post-operative Echo Findings:  no paravalvular leak  Normal left ventricular systolic function   BRIEF CLINICAL NOTE AND INDICATIONS FOR SURGERY  This 82 year old gentleman has what I feel is stage D, severe, symptomatic aortic stenosis with New York Heart Association class II symptoms of exertional fatigue and shortness of breath consistent with chronic diastolic congestive heart failure. He and his son have noted the exertional fatigue but he denies any shortness of breath. During his physical therapy evaluation he developed shortness of breath and fatigue after ambulating 240 feet in 3 minutes and 4 seconds and had to take a sitting rest break before completing 381 total feet in 6 minutes. I think he probably does not notice any shortness of breath at home because he is mostly ambulating short distances around in his house and if he gets outside walking he is going very slowly and holding onto someone's shoulder due to his vision loss. I have personally reviewed his 2D echocardiogram done at [facility], cardiac catheterization, and CTA studies. His echocardiogram shows a trileaflet aortic valve with severe calcification and thickening and markedly restricted leaflet mobility. The peak velocity is approaching 5 m/s consistent with severe aortic stenosis. His cardiac catheterization showed a  mean gradient of 54 mmHg and an aortic valve area calculated 0.83 cm consistent with severe aortic stenosis. He had a high-grade dominant RCA stenosis that was treated successfully with PCI with a drug-eluting stent on 11/06/2017 and he has been maintained on dual antiplatelet therapy since. His son is noticed that he was a little more active since having his PCI. I agree that aortic valve replacement is probably indicated in this patient to improve his stamina and prevent progressive left ventricular deterioration. Although he is 82 years old and legally blind he still lives independently at home with someone coming in during the day to help him take care of his house. I do not think he would be a candidate for open surgical AVR due to the significant risk of morbidity and mortality due to his age, arthritis, and vision loss which would make recovery difficult. I think transcatheter aortic valve replacement would be the best treatment for him. His gated cardiac CTA shows anatomy that is favorable for TAVR with no complicating features. His abdominal and pelvic CTA shows some focal narrowing of both external iliac arteries but I think there is adequate pelvic vascular access to allow transfemoral insertion. The left axillary artery would be an alternative access site as would transapical insertion.  The patient and his son were counseled at length regarding treatment alternatives for management of severe symptomatic aortic stenosis. The risks and benefits of surgical intervention has been discussed in detail. Long-term prognosis with medical therapy was discussed. Alternative approaches such as conventional surgical aortic valve replacement, transcatheter aortic valve replacement, and palliative medical therapy were compared and contrasted at length. This discussion was placed in the context of the patient's own  specific clinical presentation and past medical history. All of their questions have been addressed. They  understand that he will have to have a second surgical evaluation before a decision is made about the appropriateness of TAVR in his case.   A discussion was held regarding what types of management strategies would be attempted intraoperatively in the event of life-threatening complications, including whether or not the patient would be considered a candidate for the use of cardiopulmonary bypass and/or conversion to open sternotomy for attempted surgical intervention. I do not think he would be a candidate for sternotomy for any complications that developed. The patient has been advised of a variety of complications that might develop including but not limited to risks of death, stroke, paravalvular leak, aortic dissection or other major vascular complications, aortic annulus rupture, device embolization, cardiac rupture or perforation, mitral regurgitation, acute myocardial infarction, arrhythmia, heart block or bradycardia requiring permanent pacemaker placement, congestive heart failure, respiratory failure, renal failure, pneumonia, infection, other late complications related to structural valve deterioration or migration, or other complications that might ultimately cause a temporary or permanent loss of functional independence or other long term morbidity. The patient provides full informed consent for the procedure as described and all questions were answered.     DETAILS OF THE OPERATIVE PROCEDURE  PREPARATION:    The patient is brought to the operating room on the above mentioned date and central monitoring was established by the anesthesia team including placement of a central venous line and radial arterial line. The patient is placed in the supine position on the operating table.  Intravenous antibiotics are administered. The patient is monitored closely throughout the procedure under conscious sedation.    Baseline transthoracic echocardiogram was performed. The patient's chest, abdomen,  both groins, and both lower extremities are prepared and draped in a sterile manner. A time out procedure is performed.   PERIPHERAL ACCESS:    Using the modified Seldinger technique, femoral arterial and venous access was obtained with placement of 6 Fr sheaths on the right side.  A pigtail diagnostic catheter was passed through the right arterial sheath under fluoroscopic guidance into the aortic root.  A temporary transvenous pacemaker catheter was passed through the right femoral venous sheath under fluoroscopic guidance into the right ventricle.  The pacemaker was tested to ensure stable lead placement and pacemaker capture. Aortic root angiography was performed in order to determine the optimal angiographic angle for valve deployment.   TRANSFEMORAL ACCESS:   Percutaneous transfemoral access and sheath placement was performed using ultrasound guidance.  The right common femoral artery was cannulated using a micropuncture needle and appropriate location was verified using hand injection angiogram.  A pair of Abbott Perclose percutaneous closure devices were placed and a 6 French sheath replaced into the femoral artery.  The patient was heparinized systemically and ACT verified > 250 seconds.    A 14 Fr transfemoral E-sheath was introduced into the right femoral artery after progressively dilating over an Amplatz superstiff wire. An AL-2 catheter was used to direct a straight-tip exchange length wire across the native aortic valve into the left ventricle. This was exchanged out for a pigtail catheter and position was confirmed in the LV apex. Simultaneous LV and Ao pressures were recorded.  The pigtail catheter was exchanged for an Amplatz Extra-stiff wire in the LV apex.  Echocardiography was utilized to confirm appropriate wire position and no sign of entanglement in the mitral subvalvular apparatus.   BALLOON AORTIC VALVULOPLASTY:   Not performed  TRANSCATHETER HEART VALVE DEPLOYMENT:    An Edwards Sapien 3 transcatheter heart valve (size 26 mm, model #9600TFX, serial #[id number]) was prepared and crimped per manufacturer's guidelines, and the proper orientation of the valve is confirmed on the [employer] delivery system. The valve was advanced through the introducer sheath using normal technique until in an appropriate position in the abdominal aorta beyond the sheath tip. The balloon was then retracted and using the fine-tuning wheel was centered on the valve. The valve was then advanced across the aortic arch using appropriate flexion of the catheter. The valve was carefully positioned across the aortic valve annulus. The Commander catheter was retracted using normal technique. Once final position of the valve has been confirmed by angiographic assessment, the valve is deployed while temporarily holding ventilation and during rapid ventricular pacing to maintain systolic blood pressure < 50 mmHg and pulse pressure < 10 mmHg. The balloon inflation is held for >3 seconds after reaching full deployment volume. Once the balloon has fully deflated the balloon is retracted into the ascending aorta and valve function is assessed using echocardiography. There is felt to be no paravalvular leak and no central aortic insufficiency.  The patient's hemodynamic recovery following valve deployment is good.  The deployment balloon and guidewire are both removed.    PROCEDURE COMPLETION:   The sheath was removed and femoral artery closure performed using the previously placed Perclose devices.  Protamine was administered once femoral arterial repair was complete. The temporary pacemaker, pigtail catheters and femoral sheaths were removed with manual pressure used for hemostasis.   The patient tolerated the procedure well and is transported to the surgical intensive care in stable condition. There were no immediate intraoperative complications. All sponge instrument and needle counts are verified  correct at completion of the operation.   No blood products were administered during the operation.  The patient received a total of 28.5 mL of intravenous contrast during the procedure.   Gaye Pollack, MD 12/05/2017

## 2017-12-05 NOTE — Interval H&P Note (Signed)
History and Physical Interval Note:  12/05/2017 12:50 PM  Chad Rollins  has presented today for surgery, with the diagnosis of Severe Aortic Stenosis  The various methods of treatment have been discussed with the patient and family. After consideration of risks, benefits and other options for treatment, the patient has consented to  Procedure(s): TRANSCATHETER AORTIC VALVE REPLACEMENT, TRANSFEMORAL (N/A) TRANSESOPHAGEAL ECHOCARDIOGRAM (TEE) (N/A) as a surgical intervention .  The patient's history has been reviewed, patient examined, no change in status, stable for surgery.  I have reviewed the patient's chart and labs.  Questions were answered to the patient's satisfaction.     Gaye Pollack

## 2017-12-05 NOTE — Anesthesia Postprocedure Evaluation (Signed)
Anesthesia Post Note  Patient: Chad Rollins  Procedure(s) Performed: TRANSCATHETER AORTIC VALVE REPLACEMENT, TRANSFEMORAL (N/A Chest) INTRAOPERATIVE TRANSTHORACIC ECHOCARDIOGRAM (N/A )     Patient location during evaluation: ICU Anesthesia Type: MAC Level of consciousness: awake and alert Pain management: pain level controlled Vital Signs Assessment: post-procedure vital signs reviewed and stable Respiratory status: spontaneous breathing, nonlabored ventilation and respiratory function stable Cardiovascular status: stable and blood pressure returned to baseline Postop Assessment: no apparent nausea or vomiting Anesthetic complications: no    Last Vitals:  Vitals:   12/05/17 1800 12/05/17 1931  BP: (!) 141/52   Pulse: (!) 58   Resp: 12   Temp:  36.4 C  SpO2: 99%     Last Pain:  Vitals:   12/05/17 1931  TempSrc: Oral  PainSc:                  Catalina Gravel

## 2017-12-05 NOTE — Anesthesia Preprocedure Evaluation (Addendum)
Anesthesia Evaluation  Patient identified by MRN, date of birth, ID band Patient awake    Reviewed: Allergy & Precautions, NPO status , Patient's Chart, lab work & pertinent test results, reviewed documented beta blocker date and time   Airway Mallampati: II  TM Distance: >3 FB Neck ROM: Full    Dental  (+) Teeth Intact, Dental Advisory Given   Pulmonary former smoker,    Pulmonary exam normal breath sounds clear to auscultation       Cardiovascular hypertension, Pt. on home beta blockers and Pt. on medications + CAD, + Cardiac Stents (DES to [facility]) and + Peripheral Vascular Disease  + Valvular Problems/Murmurs AS, MR and AI  Rhythm:Regular Rate:Normal + Systolic murmurs Echo 2/99/24 ([place]): INTERPRETATION NORMAL LEFT VENTRICULAR SYSTOLIC FUNCTION WITH MILD LVH NORMAL RIGHT VENTRICULAR SYSTOLIC FUNCTION MILD VALVULAR REGURGITATION (See below) SEVERE VALVULAR STENOSIS (See below) Morphology: MODERATELY THICKENED Mobility: PARTIALLY MOBILE Closest EF: >55% (Estimated) LVH: MILD LVH AVS: SEVERE AS. (483.7 cm/sec peak vel, 93.6 mmHg peak grad, 51.8 mmHg mean grad, 0.43 cm^2 by DOPPLER) Aortic: MILD AR Mitral: MILD MR Tricuspid: TRIVIAL TR     Neuro/Psych  Neuromuscular disease CVA (Blind left eye), Residual Symptoms    GI/Hepatic Neg liver ROS, GERD  Medicated,  Endo/Other  diabetes, Type 2, Oral Hypoglycemic AgentsHypothyroidism   Renal/GU Renal InsufficiencyRenal disease     Musculoskeletal  (+) Arthritis , Rheumatoid disorders,    Abdominal   Peds  Hematology  (+) Blood dyscrasia (Plavix; Thrombocytopenia), anemia ,   Anesthesia Other Findings Day of surgery medications reviewed with the patient.  Reproductive/Obstetrics                            Anesthesia Physical Anesthesia Plan  ASA: IV  Anesthesia Plan: MAC   Post-op Pain Management:    Induction:  Intravenous  PONV Risk Score and Plan: 1 and Propofol infusion and Treatment may vary due to age or medical condition  Airway Management Planned: Simple Face Mask and Natural Airway  Additional Equipment: Arterial line, CVP and Ultrasound Guidance Line Placement  Intra-op Plan:   Post-operative Plan:   Informed Consent: I have reviewed the patients History and Physical, chart, labs and discussed the procedure including the risks, benefits and alternatives for the proposed anesthesia with the patient or authorized representative who has indicated his/her understanding and acceptance.   Dental advisory given  Plan Discussed with: CRNA and Anesthesiologist  Anesthesia Plan Comments:         Anesthesia Quick Evaluation

## 2017-12-05 NOTE — Op Note (Signed)
[facility]   MULTIDISCIPLINARY HEART VALVE TEAM   TAVR OPERATIVE NOTE   Date of Procedure:  12/05/2017  Preoperative Diagnosis: Severe Aortic Stenosis   Postoperative Diagnosis: Same   Procedure:    Transcatheter Aortic Valve Replacement - Percutaneous  Transfemoral Approach  Edwards Sapien 3 THV (size 26 mm, model # 9600TFX, serial # [id number])   Co-Surgeons:  Gaye Pollack, MD and Sherren Mocha, MD  Anesthesiologist:  Hoy Morn, MD  Echocardiographer:  Ena Dawley, MD  Pre-operative Echo Findings:            severe aortic stenosis  normal left ventricular systolic function  Post-operative Echo Findings:  no paravalvular leak  normal left ventricular systolic function  BRIEF CLINICAL NOTE AND INDICATIONS FOR SURGERY  82 year-old male with diabetes, HTN, CAD s/p recent PCI, severe vision loss (legally blind), and prior stroke, presents with severe, stage D1, symptomatic aortic stenosis. He has undergone extensive evaluation and presents now for elective transfemoral TAVR.  During the course of the patient's preoperative work up they have been evaluated comprehensively by a multidisciplinary team of specialists coordinated through the [facility] in the [place] [facility].  They have been demonstrated to suffer from symptomatic severe aortic stenosis as noted above. The patient has been counseled extensively as to the relative risks and benefits of all options for the treatment of severe aortic stenosis including long term medical therapy, conventional surgery for aortic valve replacement, and transcatheter aortic valve replacement.  The patient has been independently evaluated by two cardiac surgeons including Dr. Roxy Manns and Dr. Cyndia Bent, and they are felt to be at high risk for conventional surgical aortic valve replacement.  Based upon review of all of the patient's preoperative diagnostic tests they are felt to  be candidate for transcatheter aortic valve replacement using the transfemoral approach as an alternative to high risk conventional surgery.    Following the decision to proceed with transcatheter aortic valve replacement, a discussion has been held regarding what types of management strategies would be attempted intraoperatively in the event of life-threatening complications, including whether or not the patient would be considered a candidate for the use of cardiopulmonary bypass and/or conversion to open sternotomy for attempted surgical intervention.  The patient has been advised of a variety of complications that might develop peculiar to this approach including but not limited to risks of death, stroke, paravalvular leak, aortic dissection or other major vascular complications, aortic annulus rupture, device embolization, cardiac rupture or perforation, acute myocardial infarction, arrhythmia, heart block or bradycardia requiring permanent pacemaker placement, congestive heart failure, respiratory failure, renal failure, pneumonia, infection, other late complications related to structural valve deterioration or migration, or other complications that might ultimately cause a temporary or permanent loss of functional independence or other long term morbidity.  The patient provides full informed consent for the procedure as described and all questions were answered preoperatively.  DETAILS OF THE OPERATIVE PROCEDURE  PREPARATION:   The patient is brought to the operating room on the above mentioned date and central monitoring was established by the anesthesia team including placement of a central venous catheter and radial arterial line. The patient is placed in the supine position on the operating table.  Intravenous antibiotics are administered. The patient is monitored closely throughout the procedure under conscious sedation.  Baseline transthoracic echocardiogram is performed. The patient's chest,  abdomen, both groins, and both lower extremities are prepared and draped in a sterile manner. A  time out procedure is performed.   PERIPHERAL ACCESS:   Using ultrasound guidance, femoral arterial and venous access is obtained with placement of 6 Fr sheaths on the left side.  A pigtail diagnostic catheter was passed through the femoral arterial sheath under fluoroscopic guidance into the aortic root.  A temporary transvenous pacemaker catheter was passed through the femoral venous sheath under fluoroscopic guidance into the right ventricle.  The pacemaker was tested to ensure stable lead placement and pacemaker capture. Aortic root angiography was performed in order to determine the optimal angiographic angle for valve deployment.  TRANSFEMORAL ACCESS:  A micropuncture technique is used to access the right femoral artery under fluoroscopic and ultrasound guidance. Ultrasound images are captured and stored in the patient's chart.  2 Perclose devices are deployed at 10' and 2' positions to 'PreClose' the femoral artery. An 8 French sheath is placed and then an Amplatz Superstiff wire is advanced through the sheath. This is changed out for a 14 French transfemoral E-Sheath after progressively dilating over the Superstiff wire.  An AL-2 catheter was used to direct a straight-tip exchange length wire across the native aortic valve into the left ventricle. This was exchanged out for a pigtail catheter and position was confirmed in the LV apex. Simultaneous LV and Ao pressures were recorded.  The pigtail catheter was exchanged for an Amplatz Extra-stiff wire in the LV apex.  Echocardiography was utilized to confirm appropriate wire position and no sign of entanglement in the mitral subvalvular apparatus.  BALLOON AORTIC VALVULOPLASTY:  Not performed   TRANSCATHETER HEART VALVE DEPLOYMENT:  An Edwards Sapien 3 transcatheter heart valve (size 26 mm, model #9600TFX, serial #[id number]) was prepared and crimped per  manufacturer's guidelines, and the proper orientation of the valve is confirmed on the [employer] delivery system. The valve was advanced through the introducer sheath using normal technique until in an appropriate position in the abdominal aorta beyond the sheath tip. The balloon was then retracted and using the fine-tuning wheel was centered on the valve. The valve was then advanced across the aortic arch using appropriate flexion of the catheter. The valve was carefully positioned across the aortic valve annulus. The Commander catheter was retracted using normal technique. Once final position of the valve has been confirmed by angiographic assessment, the valve is deployed while temporarily holding ventilation and during rapid ventricular pacing to maintain systolic blood pressure < 50 mmHg and pulse pressure < 10 mmHg. The balloon inflation is held for >3 seconds after reaching full deployment volume. Once the balloon has fully deflated the balloon is retracted into the ascending aorta and valve function is assessed using echocardiography. There is felt to be no paravalvular leak and no central aortic insufficiency.  The patient's hemodynamic recovery following valve deployment is good.  The deployment balloon and guidewire are both removed.    PROCEDURE COMPLETION:  The sheath was removed and femoral artery closure is performed using the 2 previously deployed Perclose devices.  Protamine is administered once femoral arterial repair was complete. The site is clear with no evidence of bleeding or hematoma after the sutures are tightened. The temporary pacemaker, pigtail catheters and femoral sheaths were removed with manual pressure used for hemostasis.   The patient tolerated the procedure well and is transported to the surgical intensive care in stable condition. There were no immediate intraoperative complications. All sponge instrument and needle counts are verified correct at completion of the  operation.   The patient received a total of 28.5  mL of intravenous contrast during the procedure.   Sherren Mocha, MD 12/05/2017 6:03 PM

## 2017-12-05 NOTE — Transfer of Care (Signed)
Immediate Anesthesia Transfer of Care Note  Patient: Chad Rollins  Procedure(s) Performed: TRANSCATHETER AORTIC VALVE REPLACEMENT, TRANSFEMORAL (N/A Chest) INTRAOPERATIVE TRANSTHORACIC ECHOCARDIOGRAM (N/A )  Patient Location: SICU  Anesthesia Type:MAC  Level of Consciousness: sedated  Airway & Oxygen Therapy: Patient Spontanous Breathing  Post-op Assessment: Report given to RN and Post -op Vital signs reviewed and stable  Post vital signs: Reviewed and stable  Last Vitals:  Vitals Value Taken Time  BP    Temp    Pulse    Resp    SpO2      Last Pain:  Vitals:   12/05/17 1125  TempSrc:   PainSc: 0-No pain         Complications: No apparent anesthesia complications

## 2017-12-05 NOTE — Interval H&P Note (Signed)
History and Physical Interval Note:  12/05/2017 12:51 PM  Chad Rollins  has presented today for surgery, with the diagnosis of Severe Aortic Stenosis  The various methods of treatment have been discussed with the patient and family. After consideration of risks, benefits and other options for treatment, the patient has consented to  Procedure(s): TRANSCATHETER AORTIC VALVE REPLACEMENT, TRANSFEMORAL (N/A) TRANSESOPHAGEAL ECHOCARDIOGRAM (TEE) (N/A) as a surgical intervention .  The patient's history has been reviewed, patient examined, no change in status, stable for surgery.  I have reviewed the patient's chart and labs.  Questions were answered to the patient's satisfaction.     Gaye Pollack

## 2017-12-06 ENCOUNTER — Inpatient Hospital Stay (HOSPITAL_COMMUNITY): Payer: PPO

## 2017-12-06 ENCOUNTER — Encounter (HOSPITAL_COMMUNITY): Payer: Self-pay | Admitting: Cardiovascular Disease

## 2017-12-06 ENCOUNTER — Other Ambulatory Visit: Payer: Self-pay | Admitting: Physician Assistant

## 2017-12-06 DIAGNOSIS — I350 Nonrheumatic aortic (valve) stenosis: Secondary | ICD-10-CM

## 2017-12-06 DIAGNOSIS — Z954 Presence of other heart-valve replacement: Secondary | ICD-10-CM

## 2017-12-06 DIAGNOSIS — I25118 Atherosclerotic heart disease of native coronary artery with other forms of angina pectoris: Secondary | ICD-10-CM

## 2017-12-06 LAB — POCT I-STAT 4, (NA,K, GLUC, HGB,HCT)
GLUCOSE: 207 mg/dL — AB (ref 65–99)
HEMATOCRIT: 29 % — AB (ref 39.0–52.0)
HEMOGLOBIN: 9.9 g/dL — AB (ref 13.0–17.0)
POTASSIUM: 4 mmol/L (ref 3.5–5.1)
Sodium: 144 mmol/L (ref 135–145)

## 2017-12-06 LAB — CBC
HEMATOCRIT: 34.6 % — AB (ref 39.0–52.0)
HEMOGLOBIN: 11.5 g/dL — AB (ref 13.0–17.0)
MCH: 30.5 pg (ref 26.0–34.0)
MCHC: 33.2 g/dL (ref 30.0–36.0)
MCV: 91.8 fL (ref 78.0–100.0)
Platelets: 116 10*3/uL — ABNORMAL LOW (ref 150–400)
RBC: 3.77 MIL/uL — ABNORMAL LOW (ref 4.22–5.81)
RDW: 13.2 % (ref 11.5–15.5)
WBC: 12.8 10*3/uL — ABNORMAL HIGH (ref 4.0–10.5)

## 2017-12-06 LAB — BASIC METABOLIC PANEL
Anion gap: 7 (ref 5–15)
BUN: 28 mg/dL — ABNORMAL HIGH (ref 6–20)
CALCIUM: 8.2 mg/dL — AB (ref 8.9–10.3)
CHLORIDE: 111 mmol/L (ref 101–111)
CO2: 22 mmol/L (ref 22–32)
CREATININE: 1.34 mg/dL — AB (ref 0.61–1.24)
GFR calc non Af Amer: 48 mL/min — ABNORMAL LOW (ref >60)
GFR, EST AFRICAN AMERICAN: 56 mL/min — AB (ref >60)
GLUCOSE: 190 mg/dL — AB (ref 65–99)
Potassium: 3.9 mmol/L (ref 3.5–5.1)
Sodium: 140 mmol/L (ref 135–145)

## 2017-12-06 LAB — ECHOCARDIOGRAM LIMITED
Height: 70 in
WEIGHTICAEL: 3255.75 [oz_av]

## 2017-12-06 LAB — GLUCOSE, CAPILLARY
GLUCOSE-CAPILLARY: 255 mg/dL — AB (ref 65–99)
Glucose-Capillary: 166 mg/dL — ABNORMAL HIGH (ref 65–99)
Glucose-Capillary: 195 mg/dL — ABNORMAL HIGH (ref 65–99)
Glucose-Capillary: 197 mg/dL — ABNORMAL HIGH (ref 65–99)

## 2017-12-06 LAB — MAGNESIUM: Magnesium: 1.6 mg/dL — ABNORMAL LOW (ref 1.7–2.4)

## 2017-12-06 MED ORDER — MAGNESIUM SULFATE 2 GM/50ML IV SOLN
2.0000 g | Freq: Once | INTRAVENOUS | Status: AC
  Administered 2017-12-06: 2 g via INTRAVENOUS
  Filled 2017-12-06: qty 50

## 2017-12-06 MED ORDER — LOSARTAN POTASSIUM 50 MG PO TABS
50.0000 mg | ORAL_TABLET | Freq: Two times a day (BID) | ORAL | Status: DC
  Administered 2017-12-06 – 2017-12-08 (×5): 50 mg via ORAL
  Filled 2017-12-06 (×5): qty 1

## 2017-12-06 MED ORDER — SODIUM CHLORIDE 0.9% FLUSH: 10.0000 mL | INTRAVENOUS | Status: DC | PRN

## 2017-12-06 MED ORDER — CHLORHEXIDINE GLUCONATE CLOTH 2 % EX PADS
6.0000 | MEDICATED_PAD | Freq: Every day | CUTANEOUS | Status: DC
  Administered 2017-12-06: 6 via TOPICAL

## 2017-12-06 MED ORDER — METOPROLOL TARTRATE 25 MG PO TABS: 25.0000 mg | ORAL_TABLET | Freq: Two times a day (BID) | ORAL | Status: DC

## 2017-12-06 MED ORDER — SODIUM CHLORIDE 0.9% FLUSH
10.0000 mL | Freq: Two times a day (BID) | INTRAVENOUS | Status: DC
  Administered 2017-12-06: 10 mL

## 2017-12-06 MED ORDER — FUROSEMIDE 40 MG PO TABS
40.0000 mg | ORAL_TABLET | Freq: Every day | ORAL | Status: DC
  Administered 2017-12-06 – 2017-12-08 (×3): 40 mg via ORAL
  Filled 2017-12-06 (×3): qty 1

## 2017-12-06 NOTE — Progress Notes (Signed)
Pt received from [place]. VSS. Telemetry applied. CHG complete. Pt and family oriented to room and unit. Soft touch call bell provided and in reach. Will continue to unit.  Clyde Canterbury, RN

## 2017-12-06 NOTE — Progress Notes (Signed)
Cards fellow paged regarding patient Mag 1.6, urinary retention, and increased BP. Verbal orders for 2g mag IV, I/O cath, PRN 5 mg IV hydralazine SBP >170 were given.  Will initiate orders and continue to monitor.

## 2017-12-06 NOTE — Progress Notes (Signed)
CARDIAC REHAB PHASE I   PRE:  Rate/Rhythm: 67 SR  BP:  Supine:   Sitting: 121/87  Standing:    SaO2: 100 RA  MODE:  Ambulation: 295 ft   POST:  Rate/Rhythm: 84 SR  BP:  Supine: 151/58  Sitting:   Standing:    SaO2: 100 RA 1125-1200 Assisted X 2 and used walker to ambulate. Gait steady with walker. VS stable Pt able to walk 295 feet without c/o. To bed after walk to have IV site discontinued.  Rodney Langton RN 12/06/2017 12:00 PM

## 2017-12-06 NOTE — Progress Notes (Addendum)
[facility]alty VALVE TEAM  Patient Name: Chad Rollins Date of Encounter: 12/06/2017  Primary Cardiologist: Dr. Ubaldo Glassing Dr. Burt Knack & Dr. Cyndia Bent (TAVR)   Hospital Problem List     Principal Problem:   S/P TAVR (transcatheter aortic valve replacement) Active Problems:   BPH with obstruction/lower urinary tract symptoms   Severe aortic stenosis   Cerebrovascular accident, old   HLD (hyperlipidemia)   Type 2 diabetes mellitus (HCC)   HTN (hypertension)   CAD (coronary artery disease)   Hypothyroidism   Rheumatoid arthritis (HCC)     Subjective   No complaints.  Up eating breakfast.  Inpatient Medications    Scheduled Meds: . aspirin EC  81 mg Oral Daily  . atorvastatin  20 mg Oral QHS  . brimonidine  1 drop Left Eye BID  . busPIRone  15 mg Oral BID  . Chlorhexidine Gluconate Cloth  6 each Topical Daily  . clopidogrel  75 mg Oral QHS  . dorzolamide-timolol  1 drop Both Eyes BID  . finasteride  5 mg Oral Daily  . furosemide  40 mg Oral Daily  . insulin aspart  0-15 Units Subcutaneous TID WC  . latanoprost  1 drop Both Eyes QHS  . levothyroxine  75 mcg Oral QAC breakfast  . losartan  50 mg Oral BID  . Melatonin  3 mg Oral QHS  . pantoprazole  40 mg Oral Daily  . sodium chloride flush  10-40 mL Intracatheter Q12H  . tamsulosin  0.4 mg Oral QPC supper  . vitamin B-12  1,000 mcg Oral QHS   Continuous Infusions: . sodium chloride 50 mL/hr at 12/05/17 1615  . albumin human    . cefUROXime (ZINACEF)  IV Stopped (12/05/17 2313)  . lactated ringers    . nitroGLYCERIN    . phenylephrine (NEO-SYNEPHRINE) Adult infusion     PRN Meds: albumin human, fentaNYL (SUBLIMAZE) injection, lactated ringers, metoprolol tartrate, midazolam, morphine injection, ondansetron (ZOFRAN) IV, oxyCODONE, sodium chloride flush, traMADol   Vital Signs    Vitals:   12/06/17 0500 12/06/17 0600 12/06/17 0700 12/06/17 0800  BP: (!) 158/63 (!) 152/55 (!) 146/59  (!) 157/59  Pulse: 83 85 82 84  Resp: 15 17 18 14   Temp:    (!) 97.3 F (36.3 C)  TempSrc:    Oral  SpO2: 100% 97% 95% 99%  Weight: 203 lb 7.8 oz (92.3 kg)     Height:        Intake/Output Summary (Last 24 hours) at 12/06/2017 0920 Last data filed at 12/06/2017 0800 Gross per 24 hour  Intake 1472.77 ml  Output 1020 ml  Net 452.77 ml   Filed Weights   12/05/17 1059 12/06/17 0500  Weight: 203 lb (92.1 kg) 203 lb 7.8 oz (92.3 kg)    Physical Exam   GEN: Well nourished, well developed, in no acute distress.  Chronically ill-appearing.  HEENT: Grossly normal.  Neck: Supple, no JVD, carotid bruits, or masses. Cardiac: RRR, 2/6 SEM @ RUSB. No rubs, or gallops. No clubbing, cyanosis, edema.  Radials/DP/PT 2+ and equal bilaterally.  Respiratory:  Respirations regular and unlabored, clear to auscultation bilaterally. GI: Soft, nontender, nondistended, BS + x 4. MS: no deformity or atrophy. Skin: warm and dry, no rash.  Groin site stable Neuro:  Strength and sensation are intact. Psych: AAOx3.  Normal affect.  Labs    CBC Recent Labs    12/05/17 1638 12/06/17 0351  WBC 12.6* 12.8*  HGB  11.0* 11.5*  HCT 33.2* 34.6*  MCV 92.5 91.8  PLT 117* 161*   Basic Metabolic Panel Recent Labs    12/05/17 1638 12/06/17 0351  NA 141 140  K 4.0 3.9  CL 109 111  CO2 23 22  GLUCOSE 198* 190*  BUN 29* 28*  CREATININE 1.29* 1.34*  CALCIUM 8.2* 8.2*  MG  --  1.6*   Liver Function Tests Recent Labs    12/04/17 0916  AST 17  ALT 18  ALKPHOS 59  BILITOT 0.9  PROT 6.3*  ALBUMIN 3.9   No results for input(s): LIPASE, AMYLASE in the last 72 hours. Cardiac Enzymes No results for input(s): CKTOTAL, CKMB, CKMBINDEX, TROPONINI in the last 72 hours. BNP Invalid input(s): POCBNP D-Dimer No results for input(s): DDIMER in the last 72 hours. Hemoglobin A1C Recent Labs    12/04/17 0914  HGBA1C 5.9*   Fasting Lipid Panel No results for input(s): CHOL, HDL, LDLCALC, TRIG, CHOLHDL,  LDLDIRECT in the last 72 hours. Thyroid Function Tests No results for input(s): TSH, T4TOTAL, T3FREE, THYROIDAB in the last 72 hours.  Invalid input(s): FREET3  Telemetry    LBBB, sinus tachycardia currently with a short burst of bradycardia this morning.- Personally Reviewed  ECG    Sinus rhythm with first-degree AV block and new LBBB- Personally Reviewed  Radiology    Dg Chest 2 View  Result Date: 12/04/2017 CLINICAL DATA:  Preoperative evaluation for AVR EXAM: CHEST - 2 VIEW COMPARISON:  09/17/2015 FINDINGS: Enlargement of cardiac silhouette. Mediastinal contours and pulmonary vascularity normal. Minimal RIGHT basilar atelectasis. Lungs otherwise clear. No pulmonary infiltrate, pleural effusion or pneumothorax. Minimal atherosclerotic calcification aortic arch. Bones demineralized with evidence of a chronic RIGHT rotator cuff tear. IMPRESSION: Minimal RIGHT basilar atelectasis. Enlargement of cardiac silhouette. Electronically Signed   By: Lavonia Dana M.D.   On: 12/04/2017 09:34   Dg Chest Port 1 View  Result Date: 12/05/2017 CLINICAL DATA:  Post TAVR EXAM: PORTABLE CHEST 1 VIEW COMPARISON:  Portable exam 1615 hours compared to 12/04/2017 FINDINGS: RIGHT jugular central venous catheter deviates into the RIGHT subclavian vein with the tip projecting over the RIGHT axillary vein directed towards the RIGHT upper extremity. Interval TAVR. Enlargement of cardiac silhouette. Mediastinal contours and pulmonary vascularity normal. Lungs clear. No pleural effusion or pneumothorax. Bones demineralized with probable chronic RIGHT rotator cuff tear. IMPRESSION: Post TAVR. Tip of RIGHT jugular catheter is in the RIGHT axillary vein directed towards the RIGHT upper extremity. Findings called to Miguel Rota PA on 12/05/2017 at 1631 hours. Electronically Signed   By: Lavonia Dana M.D.   On: 12/05/2017 16:34    Cardiac Studies    TAVR OPERATIVE NOTE   Date of Procedure:                 12/05/2017  Preoperative Diagnosis:      Severe Aortic Stenosis   Postoperative Diagnosis:    Same   Procedure:        Transcatheter Aortic Valve Replacement - Percutaneous  Transfemoral Approach             Edwards Sapien 3 THV (size 26 mm, model # 9600TFX, serial # [id number])              Co-Surgeons:                        Gaye Pollack, MD and Sherren Mocha, MD   Pre-operative Echo Findings:  severe aortic stenosis ? normal left ventricular systolic function  Post-operative Echo Findings: ? no paravalvular leak ? normal left ventricular systolic function  ________________    Patient Profile     Chad Rollins is a 82 y.o. male with a history of rheumatoid arthritis, HTN, DMT2, CAD s/p DES to RCA on 11/06/2017, peripheral neuropathy, prior stroke with significant visual impairment and severe aortic stenosis who presented to [facility] on 12/05/2017 for planned TAVR.  Assessment & Plan    Severe AS: s/p successful TAVR with a 26 mm Edwards Sapien THV via the TF approach on 12/05/2017.  Postoperative echo pending. Groin site is stable. Continue aspirin and Plavix.  Remove central line and transfer to the floor   CAD: s/p recent PCI.  Continue aspirin and Plavix, statin.  Bradycardia: He had a short episode of bradycardia and hypotension when getting up this morning.  ?vagal episode.  Currently heart rates are in the low 100s.  We will continue to hold home BB and monitor.  HTN: he has been hypertensive.  I will resume his home Lasix and losartan.  Hold metoprolol given new left bundle branch transient episode of bradycardia and hypotension.  DMT2: Continue SSI while admitted   Signed, Angelena Form, PA-C  12/06/2017, 9:20 AM  Pager [phone]   Chart reviewed, patient examined, agree with above. He did well overnight with one brief episode of bradycardia and hypotension. He has new LBBB on ECG but sinus 90-100 this am. Will hold beta blocker. I reviewed his 2D echo  and aortic valve prosthesis looks good with no paravalvular leak, mean gradient 7 mm Hg. No pericardial effusion. Plan transfer to 4E and mobilization. Possibly home tomorrow.

## 2017-12-06 NOTE — Progress Notes (Signed)
  Echocardiogram 2D Echocardiogram limited post TAVR has been performed.  Darlina Sicilian M 12/06/2017, 9:48 AM

## 2017-12-06 NOTE — Discharge Instructions (Signed)

## 2017-12-07 LAB — GLUCOSE, CAPILLARY
GLUCOSE-CAPILLARY: 185 mg/dL — AB (ref 65–99)
GLUCOSE-CAPILLARY: 119 mg/dL — AB (ref 65–99)
Glucose-Capillary: 192 mg/dL — ABNORMAL HIGH (ref 65–99)
Glucose-Capillary: 264 mg/dL — ABNORMAL HIGH (ref 65–99)

## 2017-12-07 LAB — BASIC METABOLIC PANEL
ANION GAP: 6 (ref 5–15)
BUN: 22 mg/dL — AB (ref 6–20)
CO2: 26 mmol/L (ref 22–32)
Calcium: 8.2 mg/dL — ABNORMAL LOW (ref 8.9–10.3)
Chloride: 107 mmol/L (ref 101–111)
Creatinine, Ser: 1.25 mg/dL — ABNORMAL HIGH (ref 0.61–1.24)
GFR calc Af Amer: >60 mL/min (ref >60)
GFR calc non Af Amer: 53 mL/min — ABNORMAL LOW (ref >60)
GLUCOSE: 257 mg/dL — AB (ref 65–99)
POTASSIUM: 4.1 mmol/L (ref 3.5–5.1)
Sodium: 139 mmol/L (ref 135–145)

## 2017-12-07 LAB — CBC
HEMATOCRIT: 33.8 % — AB (ref 39.0–52.0)
HEMOGLOBIN: 11.3 g/dL — AB (ref 13.0–17.0)
MCH: 31 pg (ref 26.0–34.0)
MCHC: 33.4 g/dL (ref 30.0–36.0)
MCV: 92.6 fL (ref 78.0–100.0)
Platelets: 106 10*3/uL — ABNORMAL LOW (ref 150–400)
RBC: 3.65 MIL/uL — ABNORMAL LOW (ref 4.22–5.81)
RDW: 13.3 % (ref 11.5–15.5)
WBC: 14.5 10*3/uL — ABNORMAL HIGH (ref 4.0–10.5)

## 2017-12-07 MED ORDER — MAGNESIUM HYDROXIDE 400 MG/5ML PO SUSP: 15.0000 mL | Freq: Every day | ORAL | Status: DC | PRN

## 2017-12-07 NOTE — Progress Notes (Signed)
Pt has been agitated and confused attempting to get out of bed several times. In the process, hit his right hand against the side rail and had a skin tear. Site has been assessed, and gauze placed by charge nurse. Pt was educated to remain in bed and use the call light. Bed alarm on, side rails up (3/4), call bell within reach. Will continue to monitor.  Fransico Michael, RN

## 2017-12-07 NOTE — Progress Notes (Signed)
CARDIAC REHAB PHASE I   PRE:  Rate/Rhythm: Sinus 72  BP:    Sitting: 163/65     SaO2: 98%  MODE:  Ambulation: 300 ft   POST:  Rate/Rhythem: 80  BP:    Sitting: 163/72     SaO2: 99% Room Air  [phone] Patient walked in the hallway with assistance times one using the rollator without complaints. Tolerated well. Patient assisted back to recliner with call bell within reach. Family present. I previously educated Chad Rollins and his family when he had his DES in May. Chad Rollins's daughter still has that information. Phase 2 order placed for [facility] Patient's daughter not sure if he will be able to participate.   Harrell Gave RN BSN

## 2017-12-07 NOTE — Progress Notes (Addendum)
[facility]   MULTIDISCIPLINARY HEART VALVE TEAM  Patient Name: Chad Rollins Date of Encounter: 12/07/2017  Primary Cardiologist: Dr. Ubaldo Glassing Dr. Burt Knack & Dr. Cyndia Bent (TAVR)   Hospital Problem List     Principal Problem:   S/P TAVR (transcatheter aortic valve replacement) Active Problems:   BPH with obstruction/lower urinary tract symptoms   Severe aortic stenosis   Cerebrovascular accident, old   HLD (hyperlipidemia)   Type 2 diabetes mellitus (HCC)   HTN (hypertension)   CAD (coronary artery disease)   Hypothyroidism   Rheumatoid arthritis (HCC)    Subjective   Feeling nauseated. Just had an episode of vomiting. Otherwise no complaints  Inpatient Medications    Scheduled Meds: . aspirin EC  81 mg Oral Daily  . atorvastatin  20 mg Oral QHS  . brimonidine  1 drop Left Eye BID  . busPIRone  15 mg Oral BID  . clopidogrel  75 mg Oral QHS  . dorzolamide-timolol  1 drop Both Eyes BID  . finasteride  5 mg Oral Daily  . furosemide  40 mg Oral Daily  . insulin aspart  0-15 Units Subcutaneous TID WC  . latanoprost  1 drop Both Eyes QHS  . levothyroxine  75 mcg Oral QAC breakfast  . losartan  50 mg Oral BID  . Melatonin  3 mg Oral QHS  . pantoprazole  40 mg Oral Daily  . tamsulosin  0.4 mg Oral QPC supper  . vitamin B-12  1,000 mcg Oral QHS   Continuous Infusions: . sodium chloride Stopped (12/06/17 1200)  . cefUROXime (ZINACEF)  IV Stopped (12/07/17 0127)  . lactated ringers     PRN Meds: lactated ringers, metoprolol tartrate, traMADol   Vital Signs    Vitals:   12/06/17 1939 12/06/17 2037 12/07/17 0300 12/07/17 0600  BP: (!) 204/75 (!) 170/73 (!) 137/51   Pulse:  80 75   Resp:  20 14   Temp: 97.6 F (36.4 C)  98.9 F (37.2 C)   TempSrc: Oral  Oral   SpO2: 98% 99% 97%   Weight:    203 lb 7.8 oz (92.3 kg)  Height:        Intake/Output Summary (Last 24 hours) at 12/07/2017 0842 Last data filed at 12/07/2017 5176 Gross per 24 hour  Intake 1260  ml  Output 1950 ml  Net -690 ml   Filed Weights   12/05/17 1059 12/06/17 0500 12/07/17 0600  Weight: 203 lb (92.1 kg) 203 lb 7.8 oz (92.3 kg) 203 lb 7.8 oz (92.3 kg)    Physical Exam   GEN: Well nourished, well developed, in no acute distress.  Chronically ill-appearing.  HEENT: Grossly normal.  Neck: Supple, no JVD, carotid bruits, or masses. Cardiac: RRR, no mumurs. No rubs, or gallops. No clubbing, cyanosis, edema.  Radials/DP/PT 2+ and equal bilaterally.  Respiratory:  Respirations regular and unlabored, clear to auscultation bilaterally. GI: Soft, nontender, nondistended, BS + x 4. MS: no deformity or atrophy. Skin: warm and dry, no rash.  Groin site with stable ecchymosis  Neuro:  Strength and sensation are intact. Psych: AAOx3.  Normal affect.  Labs    CBC Recent Labs    12/05/17 1638 12/06/17 0351  WBC 12.6* 12.8*  HGB 11.0* 11.5*  HCT 33.2* 34.6*  MCV 92.5 91.8  PLT 117* 160*   Basic Metabolic Panel Recent Labs    12/05/17 1638 12/06/17 0351  NA 141 140  K 4.0 3.9  CL 109 111  CO2  23 22  GLUCOSE 198* 190*  BUN 29* 28*  CREATININE 1.29* 1.34*  CALCIUM 8.2* 8.2*  MG  --  1.6*   Liver Function Tests Recent Labs    12/04/17 0916  AST 17  ALT 18  ALKPHOS 59  BILITOT 0.9  PROT 6.3*  ALBUMIN 3.9   No results for input(s): LIPASE, AMYLASE in the last 72 hours. Cardiac Enzymes No results for input(s): CKTOTAL, CKMB, CKMBINDEX, TROPONINI in the last 72 hours. BNP Invalid input(s): POCBNP D-Dimer No results for input(s): DDIMER in the last 72 hours. Hemoglobin A1C Recent Labs    12/04/17 0914  HGBA1C 5.9*   Fasting Lipid Panel No results for input(s): CHOL, HDL, LDLCALC, TRIG, CHOLHDL, LDLDIRECT in the last 72 hours. Thyroid Function Tests No results for input(s): TSH, T4TOTAL, T3FREE, THYROIDAB in the last 72 hours.  Invalid input(s): FREET3  Telemetry    LBBB, first deg AV block, sinus.- Personally Reviewed  ECG    Sinus rhythm  with first-degree AV block and new LBBB- Personally Reviewed  Radiology    Dg Chest Port 1 View  Result Date: 12/06/2017 CLINICAL DATA:  Status post transcatheter aortic valve replacement. EXAM: PORTABLE CHEST 1 VIEW COMPARISON:  Radiograph of December 05, 2017. FINDINGS: Stable cardiomediastinal silhouette. Aortic valve prosthesis is unchanged in position. No pneumothorax or pleural effusion is noted. Minimal bibasilar subsegmental atelectasis is noted. Right internal jugular catheter is again noted, with tip directed into right subclavian vein which is unchanged compared to prior exam. Bony thorax is unremarkable. IMPRESSION: Minimal bibasilar subsegmental atelectasis. No change in position of right internal jugular catheter, with tip directed into right subclavian vein. Electronically Signed   By: Marijo Conception, M.D.   On: 12/06/2017 09:20   Dg Chest Port 1 View  Result Date: 12/05/2017 CLINICAL DATA:  Post TAVR EXAM: PORTABLE CHEST 1 VIEW COMPARISON:  Portable exam 1615 hours compared to 12/04/2017 FINDINGS: RIGHT jugular central venous catheter deviates into the RIGHT subclavian vein with the tip projecting over the RIGHT axillary vein directed towards the RIGHT upper extremity. Interval TAVR. Enlargement of cardiac silhouette. Mediastinal contours and pulmonary vascularity normal. Lungs clear. No pleural effusion or pneumothorax. Bones demineralized with probable chronic RIGHT rotator cuff tear. IMPRESSION: Post TAVR. Tip of RIGHT jugular catheter is in the RIGHT axillary vein directed towards the RIGHT upper extremity. Findings called to Miguel Rota PA on 12/05/2017 at 1631 hours. Electronically Signed   By: Lavonia Dana M.D.   On: 12/05/2017 16:34    Cardiac Studies    TAVR OPERATIVE NOTE   Date of Procedure:                12/05/2017  Preoperative Diagnosis:      Severe Aortic Stenosis  Procedure:        Transcatheter Aortic Valve Replacement - Percutaneous  Transfemoral  Approach             Edwards Sapien 3 THV (size 26 mm, model # 9600TFX, serial # [id number])              Co-Surgeons:                        Gaye Pollack, MD and Sherren Mocha, MD   Pre-operative Echo Findings:            severe aortic stenosis ? normal left ventricular systolic function  Post-operative Echo Findings: ? no paravalvular leak ? normal left ventricular systolic function  ________________  Post operative echo 12/06/17  Study Conclusions - Procedure narrative: Transthoracic echocardiography. Image   quality was poor. - Left ventricle: The cavity size was normal. Wall thickness was   increased in a pattern of moderate LVH. Systolic function was   vigorous. The estimated ejection fraction was in the range of 65%   to 70%. - Aortic valve: s/p Edwards Sapien 3 THV (26 mm) - no obvious   paravalvular leak or obstruction. Mean gradient (S): 7 mm Hg.   Peak gradient (S): 14 mm Hg. Valve area (VTI): 1.89 cm^2. Valve   area (Vmax): 1.54 cm^2. Valve area (Vmean): 1.65 cm^2. - Pericardium, extracardiac: There was no pericardial effusion. Impressions: - Technically difficult study. Compared to a prior study on   12/05/2017, there has been interval placement of an Edwards Sapien   3 THV (26 mm) without paravalvular leak or obstruction.   Patient Profile     Chad Rollins is a 82 y.o. male with a history of rheumatoid arthritis, HTN, DMT2, CAD s/p DES to RCA on 11/06/2017, peripheral neuropathy, prior stroke with significant visual impairment and severe aortic stenosis who [facility] on [facility]/05/2017 for planned TAVR.  Assessment & Plan    Severe AS: s/p successful TAVR with a 26 mm Edwards Sapien THV via the TF approach on 12/05/2017. ECG with new LBBB. Postoperative echo showed normally functioning aortic valve prosthesis with no PVL, mean gradient 7 mm Hg. Groin site is stable. Continue aspirin and Plavix.   CAD: s/p recent PCI.  Continue aspirin and Plavix,  statin.  Bradycardia: he had a short episode of bradycardia and hypotension when getting up yesterday AM.  ?vagal episode. He has a new LBBB after TAVR as well as first degree AV block. Currently heart rate is in 70s. We will continue to hold home BB and monitor given conduction disease.   HTN: improved on home meds. Still holding home BB given conduction issues.   DMT2: Continue SSI while admitted  Nausea: give PRN zofran  Signed, Angelena Form, PA-C  12/07/2017, 8:42 AM  Pager [phone]   Chart reviewed, patient examined, agree with above. He has been hemodynamically stable in sinus rhythm 70-90's Labs ok this morning. Ambulated with cardiac rehab using Rollator. Some nausea and vomited this morning. He and daughter feel that it may be related to Ultram. Will dc it. Plan home tomorrow if rhythm stable.

## 2017-12-07 NOTE — Progress Notes (Signed)
Patient vomited this morning during breakfast. Daughter called to ask that tramadol no longer be given as her Dad can not tolerate. Pt nausea resolved and agrees that he does not want tramadol. Payton Emerald, RN  Payton Emerald, RN

## 2017-12-08 DIAGNOSIS — Z952 Presence of prosthetic heart valve: Secondary | ICD-10-CM

## 2017-12-08 LAB — GLUCOSE, CAPILLARY
GLUCOSE-CAPILLARY: 133 mg/dL — AB (ref 65–99)
GLUCOSE-CAPILLARY: 328 mg/dL — AB (ref 65–99)

## 2017-12-08 MED FILL — Heparin Sodium (Porcine) Inj 1000 Unit/ML: INTRAMUSCULAR | Qty: 30 | Status: AC

## 2017-12-08 MED FILL — Potassium Chloride Inj 2 mEq/ML: INTRAVENOUS | Qty: 40 | Status: AC

## 2017-12-08 MED FILL — Magnesium Sulfate Inj 50%: INTRAMUSCULAR | Qty: 10 | Status: AC

## 2017-12-08 MED FILL — Phenylephrine HCl IV Soln 10 MG/ML: INTRAVENOUS | Qty: 2 | Status: AC

## 2017-12-08 MED FILL — Sodium Chloride IV Soln 0.9%: INTRAVENOUS | Qty: 250 | Status: AC

## 2017-12-08 NOTE — Care Management Note (Signed)
Case Management Note Marvetta Gibbons RN, BSN Unit 4E-Case Manager [phone]  Patient Details  Name: Chad Rollins MRN: [id number] Date of Birth: 1935/10/29  Subjective/Objective:    Pt admitted s/p TAVR                Action/Plan: PTA pt lived at home, plan to return home no CM needs noted for transition home.   Expected Discharge Date:  12/08/17               Expected Discharge Plan:  Home/Self Care  In-House Referral:  NA  Discharge planning Services  CM Consult  Post Acute Care Choice:  NA Choice offered to:     DME Arranged:    DME Agency:     HH Arranged:    HH Agency:     Status of Service:  Completed, signed off  If discussed at [place] of Stay Meetings, dates discussed:    Discharge Disposition: home/self care  Additional Comments:  Dawayne Patricia, RN 12/08/2017, 11:47 AM

## 2017-12-08 NOTE — Progress Notes (Signed)
Discussed with the patient and all questioned fully answered. He will call me if any problems arise.  IV removed. Telemetry removed. CCMD notified. Pt given all discharge instructions with son, daughter in law, and pt sister at bedside.  Fritz Pickerel, RN

## 2017-12-08 NOTE — Care Management Important Message (Signed)
Important Message  Patient Details  Name: Chad Rollins MRN: [id number] Date of Birth: 1935-12-12   Medicare Important Message Given:  Yes    Kellie Murrill P Osiris Odriscoll 12/08/2017, 3:32 PM

## 2017-12-08 NOTE — Discharge Summary (Signed)
[name]   Discharge Summary    Patient ID: Chad Rollins,  MRN: [id number], DOB/AGE: 80-Oct-1937 80 y.o.  Admit date: 12/05/2017 Discharge date: 12/08/2017  Primary Care Provider: Fulton Reek D Primary Cardiologist: Dr. Ubaldo Glassing Dr. Burt Knack & Dr. Cyndia Bent (TAVR)   Discharge Diagnoses    Principal Problem:   S/P TAVR (transcatheter aortic valve replacement) Active Problems:   BPH with obstruction/lower urinary tract symptoms   Severe aortic stenosis   Cerebrovascular accident, old   HLD (hyperlipidemia)   Type 2 diabetes mellitus (HCC)   HTN (hypertension)   CAD (coronary artery disease)   Hypothyroidism   Rheumatoid arthritis (HCC)   Allergies Allergies  Allergen Reactions  . Acetaminophen Other (See Comments)    Long period of time, effects kidney     History of Present Illness     DEIONTE Rollins is a 82 y.o. male with a history of rheumatoid arthritis, HTN, DMT2, CAD s/p DES to RCA on 11/06/2017, peripheral neuropathy, prior stroke with significant visual impairment and severe aortic stenosis who presented to [facility] on 12/05/2017 for planned TAVR.  He has had a heart murmur for many years and has been followed for aortic stenosis with serial exams and echo studies. He recently had a CXR demonstrating interstitial pulmonary edema and there was concern that aortic stenosis is now precipitating diastolic heart failure. He was referred back to Dr Ubaldo Glassing who performed an echo demonstrating worsening aortic stenosis with a mean transaortic gradient of 52 mmHg. He was referred with Dr. Burt Knack with the multidisciplinary valve team. [facility] 11/06/17 showed severe 2V CAD with severe stenosis of a large, dominant RCA and severe stenosis of a small OM branch as well as severe AS with a mean gradient of 54 mm Hg and AVA 0.83. He underwent successful PCI/DES x1 to the [facility] and placed on ASA and plavix. Continued TAVR work up with recommended.   He  was evaluated by Dr. Cyndia Bent and Dr. Roxy Manns with the multidisciplinary valve team and felt to be a reasonable TAVR candidate, which was set up for 12/05/17.   Hospital Course     Consultants: none  Severe AS: s/psuccessful TAVR with a 26 mm EdwardsSapien THVvia the TF approach on 12/05/2017.Post op ECG with new LBBB.Postoperative echo showed normally functioning aortic valve prosthesis with no PVL, mean gradient 7 mm Hg. Groin site stable. He was continued on aspirin and Plavix. Discharge home today with 2 week TOC visit with Angelena Form PA-C.   Bradycardia: he had a short episode of bradycardia and hypotension when getting up 12/06/17 AM.  ?vagal episode. He has a new LBBB after TAVR as well as first degree AV block. Given conduction disease and blindness, it was decided to keep him one more day for close monitoring. His home BB has been held. His rhythm has remained stable. If his heart rhythm remains stable, I will resume his home metoprolol 25mg  BID at follow up appointment.    HTN: BP was initially elevated, but it improved with resumption of home meds. Metop 25 mg BID held at discharge.  CAD: s/p recent DES to RCA 11/06/17.  6 months of DAPT recommended. Continue aspirin and Plavix, statin. BB being held currently.   DMT2: treated with SSI here. Resume home meds at discharge.   Nausea: treated with PRN zofran  The patient has had an uncomplicated hospital course and is recovering well. The femoral catheters sites are stable. He has been  seen by Dr. Burt Knack today and deemed ready for discharge home. All follow-up appointments have been scheduled. Discharge medications are listed below.  _____________  Discharge Vitals Blood pressure (!) 120/44, pulse 78, temperature 97.6 F (36.4 C), temperature source Oral, resp. rate 19, height 5\' 10"  (1.778 m), weight 203 lb 7.8 oz (92.3 kg), SpO2 97 %.  Filed Weights   12/05/17 1059 12/06/17 0500 12/07/17 0600  Weight: 203 lb (92.1 kg) 203 lb  7.8 oz (92.3 kg) 203 lb 7.8 oz (92.3 kg)    VS:  BP (!) 120/44 (BP Location: Left Arm)   Pulse 78   Temp 97.6 F (36.4 C) (Oral)   Resp 19   Ht 5\' 10"  (1.778 m)   Wt 203 lb 7.8 oz (92.3 kg)   SpO2 97%   BMI 29.20 kg/m    GEN: Well nourished, well developed, in no acute distress, blind HEENT: normal  Neck: no JVD, carotid bruits, or masses Cardiac: RRR; no murmurs, rubs, or gallops,no edema  Respiratory:  clear to auscultation bilaterally, normal work of breathing GI: soft, nontender, nondistended, + BS MS: no deformity or atrophy  Skin: warm and dry, no rash. Groin site with stable ecchymosis  Neuro:  Alert and Oriented x 3, Strength and sensation are intact Psych: euthymic mood, full affect    Labs & Radiologic Studies     CBC Recent Labs    12/06/17 0351 12/07/17 1028  WBC 12.8* 14.5*  HGB 11.5* 11.3*  HCT 34.6* 33.8*  MCV 91.8 92.6  PLT 116* 992*   Basic Metabolic Panel Recent Labs    12/06/17 0351 12/07/17 1028  NA 140 139  K 3.9 4.1  CL 111 107  CO2 22 26  GLUCOSE 190* 257*  BUN 28* 22*  CREATININE 1.34* 1.25*  CALCIUM 8.2* 8.2*  MG 1.6*  --    Liver Function Tests No results for input(s): AST, ALT, ALKPHOS, BILITOT, PROT, ALBUMIN in the last 72 hours. No results for input(s): LIPASE, AMYLASE in the last 72 hours. Cardiac Enzymes No results for input(s): CKTOTAL, CKMB, CKMBINDEX, TROPONINI in the last 72 hours. BNP Invalid input(s): POCBNP D-Dimer No results for input(s): DDIMER in the last 72 hours. Hemoglobin A1C No results for input(s): HGBA1C in the last 72 hours. Fasting Lipid Panel No results for input(s): CHOL, HDL, LDLCALC, TRIG, CHOLHDL, LDLDIRECT in the last 72 hours. Thyroid Function Tests No results for input(s): TSH, T4TOTAL, T3FREE, THYROIDAB in the last 72 hours.  Invalid input(s): FREET3  Dg Chest 2 View  Result Date: 12/04/2017 CLINICAL DATA:  Preoperative evaluation for AVR EXAM: CHEST - 2 VIEW COMPARISON:  09/17/2015  FINDINGS: Enlargement of cardiac silhouette. Mediastinal contours and pulmonary vascularity normal. Minimal RIGHT basilar atelectasis. Lungs otherwise clear. No pulmonary infiltrate, pleural effusion or pneumothorax. Minimal atherosclerotic calcification aortic arch. Bones demineralized with evidence of a chronic RIGHT rotator cuff tear. IMPRESSION: Minimal RIGHT basilar atelectasis. Enlargement of cardiac silhouette. Electronically Signed   By: Lavonia Dana M.D.   On: 12/04/2017 09:34   Ct Coronary Morph W/cta Cor W/score W/ca W/cm &/or Wo/cm  Addendum Date: 11/20/2017   ADDENDUM REPORT: 11/20/2017 17:09 CLINICAL DATA:  82 year old male with severe aortic stenosis being evaluated for a TAVR procedure. EXAM: Cardiac TAVR CT TECHNIQUE: The patient was scanned on a [employer]. A 120 kV retrospective scan was triggered in the descending thoracic aorta at 111 HU's. Gantry rotation speed was 250 msecs and collimation was .6 mm. No beta blockade or nitro  were given. The 3D data set was reconstructed in 5% intervals of the R-R cycle. Systolic and diastolic phases were analyzed on a dedicated work station using MPR, MIP and VRT modes. The patient received 80 cc of contrast. FINDINGS: Aortic Valve: Trileaflet aortic valve with severely thickened and calcified leaflets and severely restricted leaflet openings. There are no calcifications extending into the LVOT. Aorta: Normal size, mild diffuse calcifications, no dissection. Sinotubular Junction: 26 x 25 mm Ascending Thoracic Aorta: 30 x 29 mm Aortic Arch: 23 x 21 mm Descending Thoracic Aorta: 24 x 22 mm Sinus of Valsalva Measurements: Non-coronary: 29 mm Right -coronary: 31 mm Left -coronary: 32 mm Coronary Artery Height above Annulus: Left Main: 12 mm Right Coronary: 20 mm Virtual Basal Annulus Measurements: Maximum/Minimum Diameter: 27.6 x 24.2 mm Mean Diameter: 25.2 mm Perimeter: 80.5 mm Area: 500 mm2 Optimum Fluoroscopic Angle for Delivery:  LAO 10 CAU 10  IMPRESSION: 1. Trileaflet aortic valve with severely thickened and calcified leaflets and severely restricted leaflet openings. Annular measurements suitable for delivery of a 26 mm Edwards-SAPIEN 3 valve. 2. Sufficient coronary to annulus distance. 3. Optimum Fluoroscopic Angle for Delivery: LAO 10 CAU 10 4. No thrombus in the left atrial appendage. Electronically Signed   By: Ena Dawley   On: 11/20/2017 17:09   Result Date: 11/20/2017 EXAM: OVER-READ INTERPRETATION  CT CHEST The following report is an over-read performed by radiologist Dr. Vinnie Langton of [facility], [place] on 11/20/2017. This over-read does not include interpretation of cardiac or coronary anatomy or pathology. The coronary calcium score/coronary CTA interpretation by the cardiologist is attached. COMPARISON:  Chest CT 03/28/2013. FINDINGS: Extracardiac findings will be described separately under dictation for contemporaneously obtained CTA chest, abdomen and pelvis dated 11/20/2017. IMPRESSION: Please see separate dictation for contemporaneously obtained CTA chest, abdomen and pelvis 11/20/2017 for full description of relevant extracardiac findings. Electronically Signed: By: Vinnie Langton M.D. On: 11/20/2017 16:56   Dg Chest Port 1 View  Result Date: 12/06/2017 CLINICAL DATA:  Status post transcatheter aortic valve replacement. EXAM: PORTABLE CHEST 1 VIEW COMPARISON:  Radiograph of December 05, 2017. FINDINGS: Stable cardiomediastinal silhouette. Aortic valve prosthesis is unchanged in position. No pneumothorax or pleural effusion is noted. Minimal bibasilar subsegmental atelectasis is noted. Right internal jugular catheter is again noted, with tip directed into right subclavian vein which is unchanged compared to prior exam. Bony thorax is unremarkable. IMPRESSION: Minimal bibasilar subsegmental atelectasis. No change in position of right internal jugular catheter, with tip directed into right subclavian vein. Electronically  Signed   By: Marijo Conception, M.D.   On: 12/06/2017 09:20   Dg Chest Port 1 View  Result Date: 12/05/2017 CLINICAL DATA:  Post TAVR EXAM: PORTABLE CHEST 1 VIEW COMPARISON:  Portable exam 1615 hours compared to 12/04/2017 FINDINGS: RIGHT jugular central venous catheter deviates into the RIGHT subclavian vein with the tip projecting over the RIGHT axillary vein directed towards the RIGHT upper extremity. Interval TAVR. Enlargement of cardiac silhouette. Mediastinal contours and pulmonary vascularity normal. Lungs clear. No pleural effusion or pneumothorax. Bones demineralized with probable chronic RIGHT rotator cuff tear. IMPRESSION: Post TAVR. Tip of RIGHT jugular catheter is in the RIGHT axillary vein directed towards the RIGHT upper extremity. Findings called to Miguel Rota PA on 12/05/2017 at 1631 hours. Electronically Signed   By: Lavonia Dana M.D.   On: 12/05/2017 16:34   Ct Angio Chest Aorta W &/or Wo Contrast  Result Date: 11/21/2017 CLINICAL DATA:  82 year old male with history of  severe aortic stenosis. Preprocedural study prior to potential transcatheter aortic valve replacement (TAVR) procedure. EXAM: CT ANGIOGRAPHY CHEST, ABDOMEN AND PELVIS TECHNIQUE: Multidetector CT imaging through the chest, abdomen and pelvis was performed using the standard protocol during bolus administration of intravenous contrast. Multiplanar reconstructed images and MIPs were obtained and reviewed to evaluate the vascular anatomy. CONTRAST:  48mL ISOVUE-370 IOPAMIDOL (ISOVUE-370) INJECTION 76% COMPARISON:  Chest CT 03/28/2013. CT the abdomen and pelvis 07/17/2014. FINDINGS: CTA CHEST FINDINGS Cardiovascular: Heart size is normal. There is no significant pericardial fluid, thickening or pericardial calcification. There is aortic atherosclerosis, as well as atherosclerosis of the great vessels of the mediastinum and the coronary arteries, including calcified atherosclerotic plaque in the left main, left anterior  descending and right coronary arteries. Severe calcifications and thickening of the aortic valve. Lipomatous hypertrophy of the interatrial septum (normal anatomical variant) is incidentally noted. Mediastinum/Lymph Nodes: Several prominent borderline enlarged mediastinal and hilar lymph nodes are noted measuring up to 1.4 cm in short axis in the right hilar nodal station. Esophagus is unremarkable in appearance. No axillary lymphadenopathy. Lungs/Pleura: Several tiny 2-3 mm pulmonary nodules are noted in the lungs bilaterally, nonspecific, but statistically likely benign. No larger more suspicious appearing pulmonary nodules or masses are noted. Patchy areas of ground-glass attenuation and mild interlobular septal thickening are noted, most evident throughout the mid to lower lungs, favored to reflect a background of mild interstitial pulmonary edema. No consolidative airspace disease. No pleural effusions. Musculoskeletal/Soft Tissues: There are no aggressive appearing lytic or blastic lesions noted in the visualized portions of the skeleton. CTA ABDOMEN AND PELVIS FINDINGS Hepatobiliary: In the central aspect of segment 7 of the liver (axial image 79 of series 14) there is a 1.9 x 1.7 cm lesion which is centrally low-attenuation in demonstrates some peripheral nodular enhancement, stable compared to prior study from 2016, most compatible with a cavernous hemangioma. No other suspicious appearing cystic or solid hepatic lesions are noted. No intra or extrahepatic biliary ductal dilatation. Status post cholecystectomy. Pancreas: No pancreatic mass. No pancreatic ductal dilatation. No pancreatic or peripancreatic fluid or inflammatory changes. Spleen: Large splenule adjacent to the splenic hilum. Adrenals/Urinary Tract: Subcentimeter low-attenuation lesion in the lower pole of the right kidney his too small to characterize, but similar to prior study, statistically likely a cyst. Mild atrophy of both kidneys. No  hydroureteronephrosis. Urinary bladder is normal in appearance. Bilateral adrenal glands are normal in appearance. Stomach/Bowel: Normal appearance of the stomach. No pathologic dilatation of small bowel or colon. Numerous colonic diverticulae are noted, without surrounding inflammatory changes to suggest an acute diverticulitis at this time. Normal appendix. Vascular/Lymphatic: Extensive aortic atherosclerosis without evidence of aneurysm or dissection in the abdominal or pelvic vasculature. Vascular findings and measurements pertinent to potential TAVR procedure, as detailed below. Celiac axis, superior mesenteric artery and inferior mesenteric artery are all widely patent without hemodynamically significant stenosis. Mild stenosis of the dominant left renal artery. There is also a small accessory branch to the lower pole which appears patent. Single right renal artery is widely patent without hemodynamically significant stenosis. No lymphadenopathy noted in the abdomen or pelvis. Reproductive: Prostate gland and seminal vesicles are unremarkable in appearance. Other: No significant volume of ascites.  No pneumoperitoneum. Musculoskeletal: There are no aggressive appearing lytic or blastic lesions noted in the visualized portions of the skeleton. VASCULAR MEASUREMENTS PERTINENT TO TAVR: AORTA: Minimal Aortic Diameter-10 x 9 mm Severity of Aortic Calcification-severe RIGHT PELVIS: Right Common Iliac Artery - Minimal Diameter-9.5 x 6.8 mm  Tortuosity-mild Calcification-moderate Right External Iliac Artery - Minimal Diameter-5.8 x 4.3 mm Tortuosity-severe Calcification-mild Right Common Femoral Artery - Minimal Diameter-6.7 x 5.1 mm Tortuosity-mild Calcification-moderate LEFT PELVIS: Left Common Iliac Artery - Minimal Diameter-7.3 x 6.5 mm Tortuosity-mild Calcification-moderate Left External Iliac Artery - Minimal Diameter-6.1 x 4.0 mm Tortuosity-moderate Calcification-mild Left Common Femoral Artery - Minimal  Diameter-5.9 x 5.8 mm Tortuosity-mild Calcification-moderate Review of the MIP images confirms the above findings. IMPRESSION: 1. Vascular findings and measurements pertinent to potential TAVR procedure, as detailed above. 2. Severe thickening calcification of the aortic valve, compatible with the reported clinical history of severe aortic stenosis. 3. Findings in the lungs concerning for mild interstitial pulmonary edema. 4. Multiple prominent borderline enlarged mediastinal and hilar lymph nodes. These are nonspecific, but in the setting of possible pulmonary edema are favored to be benign. Further clinical evaluation to exclude lymphoproliferative disorder should be considered. 5. Aortic atherosclerosis, in addition to left main and 3 vessel coronary artery disease. 6. Colonic diverticulosis without evidence of acute diverticulitis at this time. 7. Cavernous hemangioma in segment 7 of the liver again noted, as above. 8. Status post cholecystectomy. 9. Additional incidental findings, as above. Aortic Atherosclerosis (ICD10-I70.0). Electronically Signed   By: Vinnie Langton M.D.   On: 11/21/2017 08:57   Ct Angio Abdomen Pelvis  W &/or Wo Contrast  Result Date: 11/21/2017 CLINICAL DATA:  82 year old male with history of severe aortic stenosis. Preprocedural study prior to potential transcatheter aortic valve replacement (TAVR) procedure. EXAM: CT ANGIOGRAPHY CHEST, ABDOMEN AND PELVIS TECHNIQUE: Multidetector CT imaging through the chest, abdomen and pelvis was performed using the standard protocol during bolus administration of intravenous contrast. Multiplanar reconstructed images and MIPs were obtained and reviewed to evaluate the vascular anatomy. CONTRAST:  33mL ISOVUE-370 IOPAMIDOL (ISOVUE-370) INJECTION 76% COMPARISON:  Chest CT 03/28/2013. CT the abdomen and pelvis 07/17/2014. FINDINGS: CTA CHEST FINDINGS Cardiovascular: Heart size is normal. There is no significant pericardial fluid, thickening or  pericardial calcification. There is aortic atherosclerosis, as well as atherosclerosis of the great vessels of the mediastinum and the coronary arteries, including calcified atherosclerotic plaque in the left main, left anterior descending and right coronary arteries. Severe calcifications and thickening of the aortic valve. Lipomatous hypertrophy of the interatrial septum (normal anatomical variant) is incidentally noted. Mediastinum/Lymph Nodes: Several prominent borderline enlarged mediastinal and hilar lymph nodes are noted measuring up to 1.4 cm in short axis in the right hilar nodal station. Esophagus is unremarkable in appearance. No axillary lymphadenopathy. Lungs/Pleura: Several tiny 2-3 mm pulmonary nodules are noted in the lungs bilaterally, nonspecific, but statistically likely benign. No larger more suspicious appearing pulmonary nodules or masses are noted. Patchy areas of ground-glass attenuation and mild interlobular septal thickening are noted, most evident throughout the mid to lower lungs, favored to reflect a background of mild interstitial pulmonary edema. No consolidative airspace disease. No pleural effusions. Musculoskeletal/Soft Tissues: There are no aggressive appearing lytic or blastic lesions noted in the visualized portions of the skeleton. CTA ABDOMEN AND PELVIS FINDINGS Hepatobiliary: In the central aspect of segment 7 of the liver (axial image 79 of series 14) there is a 1.9 x 1.7 cm lesion which is centrally low-attenuation in demonstrates some peripheral nodular enhancement, stable compared to prior study from 2016, most compatible with a cavernous hemangioma. No other suspicious appearing cystic or solid hepatic lesions are noted. No intra or extrahepatic biliary ductal dilatation. Status post cholecystectomy. Pancreas: No pancreatic mass. No pancreatic ductal dilatation. No pancreatic or peripancreatic fluid or inflammatory  changes. Spleen: Large splenule adjacent to the splenic  hilum. Adrenals/Urinary Tract: Subcentimeter low-attenuation lesion in the lower pole of the right kidney his too small to characterize, but similar to prior study, statistically likely a cyst. Mild atrophy of both kidneys. No hydroureteronephrosis. Urinary bladder is normal in appearance. Bilateral adrenal glands are normal in appearance. Stomach/Bowel: Normal appearance of the stomach. No pathologic dilatation of small bowel or colon. Numerous colonic diverticulae are noted, without surrounding inflammatory changes to suggest an acute diverticulitis at this time. Normal appendix. Vascular/Lymphatic: Extensive aortic atherosclerosis without evidence of aneurysm or dissection in the abdominal or pelvic vasculature. Vascular findings and measurements pertinent to potential TAVR procedure, as detailed below. Celiac axis, superior mesenteric artery and inferior mesenteric artery are all widely patent without hemodynamically significant stenosis. Mild stenosis of the dominant left renal artery. There is also a small accessory branch to the lower pole which appears patent. Single right renal artery is widely patent without hemodynamically significant stenosis. No lymphadenopathy noted in the abdomen or pelvis. Reproductive: Prostate gland and seminal vesicles are unremarkable in appearance. Other: No significant volume of ascites.  No pneumoperitoneum. Musculoskeletal: There are no aggressive appearing lytic or blastic lesions noted in the visualized portions of the skeleton. VASCULAR MEASUREMENTS PERTINENT TO TAVR: AORTA: Minimal Aortic Diameter-10 x 9 mm Severity of Aortic Calcification-severe RIGHT PELVIS: Right Common Iliac Artery - Minimal Diameter-9.5 x 6.8 mm Tortuosity-mild Calcification-moderate Right External Iliac Artery - Minimal Diameter-5.8 x 4.3 mm Tortuosity-severe Calcification-mild Right Common Femoral Artery - Minimal Diameter-6.7 x 5.1 mm Tortuosity-mild Calcification-moderate LEFT PELVIS: Left Common  Iliac Artery - Minimal Diameter-7.3 x 6.5 mm Tortuosity-mild Calcification-moderate Left External Iliac Artery - Minimal Diameter-6.1 x 4.0 mm Tortuosity-moderate Calcification-mild Left Common Femoral Artery - Minimal Diameter-5.9 x 5.8 mm Tortuosity-mild Calcification-moderate Review of the MIP images confirms the above findings. IMPRESSION: 1. Vascular findings and measurements pertinent to potential TAVR procedure, as detailed above. 2. Severe thickening calcification of the aortic valve, compatible with the reported clinical history of severe aortic stenosis. 3. Findings in the lungs concerning for mild interstitial pulmonary edema. 4. Multiple prominent borderline enlarged mediastinal and hilar lymph nodes. These are nonspecific, but in the setting of possible pulmonary edema are favored to be benign. Further clinical evaluation to exclude lymphoproliferative disorder should be considered. 5. Aortic atherosclerosis, in addition to left main and 3 vessel coronary artery disease. 6. Colonic diverticulosis without evidence of acute diverticulitis at this time. 7. Cavernous hemangioma in segment 7 of the liver again noted, as above. 8. Status post cholecystectomy. 9. Additional incidental findings, as above. Aortic Atherosclerosis (ICD10-I70.0). Electronically Signed   By: Vinnie Langton M.D.   On: 11/21/2017 08:57     Diagnostic Studies/Procedures    TAVR OPERATIVE NOTE   Date of Procedure:12/05/2017  Preoperative Diagnosis:Severe Aortic Stenosis  Procedure:   Transcatheter Aortic Valve Replacement - Percutaneous Transfemoral Approach Edwards Sapien 3 THV (size 78mm, model # 9600TFX, serial # [id number])  Co-Surgeons:Bryan Alveria Apley, MD and Sherren Mocha, MD   Pre-operative Echo Findings: severe aortic stenosis ? normalleft ventricular systolic function  Post-operative Echo Findings: ? noparavalvular  leak ? normalleft ventricular systolic function  ________________  Post operative echo 12/06/17  Study Conclusions - Procedure narrative: Transthoracic echocardiography. Image quality was poor. - Left ventricle: The cavity size was normal. Wall thickness was increased in a pattern of moderate LVH. Systolic function was vigorous. The estimated ejection fraction was in the range of 65% to 70%. - Aortic valve: s/p Edwards Sapien 3  THV (26 mm) - no obvious paravalvular leak or obstruction. Mean gradient (S): 7 mm Hg. Peak gradient (S): 14 mm Hg. Valve area (VTI): 1.89 cm^2. Valve area (Vmax): 1.54 cm^2. Valve area (Vmean): 1.65 cm^2. - Pericardium, extracardiac: There was no pericardial effusion. Impressions: - Technically difficult study. Compared to a prior study on 12/05/2017, there has been interval placement of an Edwards Sapien 3 THV (26 mm) without paravalvular leak or obstruction.    Disposition   Pt is being discharged home today in good condition.  Follow-up Plans & Appointments    Follow-up Information    Eileen Stanford, PA-C. Go on 12/20/2017.   Specialties:  Cardiology, Radiology Why:  @ 2:30pm  Contact information: [place] [id number] [phone]          Discharge Instructions    Amb Referral to Cardiac Rehabilitation   Complete by:  As directed    Diagnosis:  Valve Replacement   Valve:  Aortic Comment - TAVR      Discharge Medications     Medication List    STOP taking these medications   metoprolol tartrate 25 MG tablet Commonly known as:  LOPRESSOR     TAKE these medications   aspirin EC 81 MG tablet Take 81 mg by mouth daily.   atorvastatin 20 MG tablet Commonly known as:  LIPITOR Take 20 mg by mouth at bedtime.   brimonidine 0.2 % ophthalmic solution Commonly known as:  ALPHAGAN Place 1 drop into the left eye 2 (two) times daily.   busPIRone 15 MG tablet Commonly known as:   BUSPAR Take 15 mg by mouth 2 (two) times daily.   clopidogrel 75 MG tablet Commonly known as:  PLAVIX Take 75 mg by mouth at bedtime.   dorzolamide-timolol 22.3-6.8 MG/ML ophthalmic solution Commonly known as:  COSOPT Place 1 drop into both eyes 2 (two) times daily.   finasteride 5 MG tablet Commonly known as:  PROSCAR Take 1 tablet (5 mg total) by mouth daily.   furosemide 40 MG tablet Commonly known as:  LASIX Take 40 mg by mouth daily.   glipiZIDE 5 MG tablet Commonly known as:  GLUCOTROL Take 5 mg by mouth 2 (two) times daily before a meal.   latanoprost 0.005 % ophthalmic solution Commonly known as:  XALATAN Place 1 drop into both eyes at bedtime.   levothyroxine 75 MCG tablet Commonly known as:  SYNTHROID, LEVOTHROID Take 75 mcg by mouth daily before breakfast.   losartan 50 MG tablet Commonly known as:  COZAAR Take 50 mg by mouth 2 (two) times daily.   Melatonin 5 MG Tabs Take 5 mg by mouth at bedtime.   metFORMIN 1000 MG tablet Commonly known as:  GLUCOPHAGE Take 1,000 mg by mouth 2 (two) times daily with a meal.   nitroGLYCERIN 0.4 MG SL tablet Commonly known as:  NITROSTAT Place 1 tablet (0.4 mg total) under the tongue every 5 (five) minutes as needed. What changed:  reasons to take this   nystatin powder Commonly known as:  MYCOSTATIN/NYSTOP Apply 1 g topically 2 (two) times daily as needed (rash under arms).   pantoprazole 40 MG tablet Commonly known as:  PROTONIX Take 1 tablet (40 mg total) by mouth daily.   PRESERVISION AREDS 2 Caps Take 1 capsule by mouth 2 (two) times daily.   tamsulosin 0.4 MG Caps capsule Commonly known as:  FLOMAX Take 1 capsule (0.4 mg total) by mouth daily after supper.  vitamin B-12 1000 MCG tablet Commonly known as:  CYANOCOBALAMIN Take 1,000 mcg by mouth at bedtime.   Vitamin D3 2000 units capsule Take 2,000 Units by mouth 2 (two) times daily.         Outstanding Labs/Studies   None.   Duration of  Discharge Encounter   Greater than 30 minutes including physician time.  Signed, Angelena Form PA-C 12/08/2017, 9:18 AM

## 2017-12-08 NOTE — Consult Note (Signed)
            [facility] CM Primary Care Navigator  12/08/2017  Chad Rollins August 23, 1935 [id number]   Went to seepatient at the bedsideto identify possible discharge needs but he was alreadydischargedhome per staff report.   PerMD note,patientwas being followed for severe aortic stenosis, underwent planned TAVR- transcatheter aortic valve replacement.  Primary care provider's office is listed as providing transition of care (TOC) follow-up.   Patient has discharge instruction to follow-up withcardiology on 12/20/17.   For additional questions please contact:  Edwena Felty A. Faiza Bansal, BSN, RN-BC [facility] PRIMARY CARE Navigator Cell: [phone]

## 2017-12-11 ENCOUNTER — Telehealth: Payer: Self-pay

## 2017-12-11 NOTE — Telephone Encounter (Signed)
Patient's daughter [name] contacted regarding discharge from [facility] on 12/08/2017.  Patient's daughter [name] understands to follow up with provider Nell Range PA-C on 12/20/2017 at 2:30 PM at [facility] office. Kim understands discharge instructions? yes [name] understands medications and regiment? yes [name] understands to bring all medications to this visit? yes  I verified with [name] that the pt is off Metoprolol Tartrate at this time. She states the pt is doing well and all questions answered. [name] is also aware of 01/10/2018 apts for Echo and OV with Katie.  She does not have her calendar book in front of her to review but plans to call me if she needs to change the time on these apts.

## 2017-12-12 ENCOUNTER — Encounter: Payer: Self-pay | Admitting: Thoracic Surgery (Cardiothoracic Vascular Surgery)

## 2017-12-14 ENCOUNTER — Other Ambulatory Visit: Payer: Self-pay

## 2017-12-14 ENCOUNTER — Encounter (HOSPITAL_COMMUNITY): Payer: Self-pay | Admitting: Emergency Medicine

## 2017-12-14 ENCOUNTER — Observation Stay (HOSPITAL_COMMUNITY)
Admission: EM | Admit: 2017-12-14 | Discharge: 2017-12-15 | Disposition: A | Discharge status: Home / Self Care | Payer: PPO | Attending: Cardiovascular Disease | Admitting: Cardiovascular Disease

## 2017-12-14 ENCOUNTER — Telehealth: Payer: Self-pay | Admitting: Cardiovascular Disease

## 2017-12-14 ENCOUNTER — Emergency Department (HOSPITAL_COMMUNITY): Payer: PPO

## 2017-12-14 DIAGNOSIS — R531 Weakness: Secondary | ICD-10-CM | POA: Diagnosis not present

## 2017-12-14 DIAGNOSIS — I350 Nonrheumatic aortic (valve) stenosis: Secondary | ICD-10-CM

## 2017-12-14 DIAGNOSIS — Z7902 Long term (current) use of antithrombotics/antiplatelets: Secondary | ICD-10-CM | POA: Diagnosis not present

## 2017-12-14 DIAGNOSIS — Z79899 Other long term (current) drug therapy: Secondary | ICD-10-CM | POA: Diagnosis not present

## 2017-12-14 DIAGNOSIS — Z7984 Long term (current) use of oral hypoglycemic drugs: Secondary | ICD-10-CM | POA: Diagnosis not present

## 2017-12-14 DIAGNOSIS — I2510 Atherosclerotic heart disease of native coronary artery without angina pectoris: Secondary | ICD-10-CM | POA: Diagnosis present

## 2017-12-14 DIAGNOSIS — E039 Hypothyroidism, unspecified: Secondary | ICD-10-CM | POA: Diagnosis not present

## 2017-12-14 DIAGNOSIS — I10 Essential (primary) hypertension: Secondary | ICD-10-CM | POA: Diagnosis present

## 2017-12-14 DIAGNOSIS — Z952 Presence of prosthetic heart valve: Secondary | ICD-10-CM | POA: Diagnosis not present

## 2017-12-14 DIAGNOSIS — Z7982 Long term (current) use of aspirin: Secondary | ICD-10-CM | POA: Diagnosis not present

## 2017-12-14 DIAGNOSIS — M6281 Muscle weakness (generalized): Secondary | ICD-10-CM | POA: Diagnosis not present

## 2017-12-14 DIAGNOSIS — E119 Type 2 diabetes mellitus without complications: Secondary | ICD-10-CM

## 2017-12-14 DIAGNOSIS — R21 Rash and other nonspecific skin eruption: Secondary | ICD-10-CM

## 2017-12-14 DIAGNOSIS — Z87891 Personal history of nicotine dependence: Secondary | ICD-10-CM | POA: Insufficient documentation

## 2017-12-14 DIAGNOSIS — R0602 Shortness of breath: Secondary | ICD-10-CM | POA: Diagnosis not present

## 2017-12-14 DIAGNOSIS — Z955 Presence of coronary angioplasty implant and graft: Secondary | ICD-10-CM | POA: Insufficient documentation

## 2017-12-14 DIAGNOSIS — I779 Disorder of arteries and arterioles, unspecified: Secondary | ICD-10-CM | POA: Diagnosis present

## 2017-12-14 DIAGNOSIS — E785 Hyperlipidemia, unspecified: Secondary | ICD-10-CM | POA: Diagnosis present

## 2017-12-14 HISTORY — DX: Left bundle-branch block, unspecified: I44.7

## 2017-12-14 LAB — HEPATIC FUNCTION PANEL
ALK PHOS: 70 U/L (ref 38–126)
ALT: 19 U/L (ref 17–63)
AST: 23 U/L (ref 15–41)
Albumin: 3.8 g/dL (ref 3.5–5.0)
BILIRUBIN INDIRECT: 0.9 mg/dL (ref 0.3–0.9)
BILIRUBIN TOTAL: 1.2 mg/dL (ref 0.3–1.2)
Bilirubin, Direct: 0.3 mg/dL (ref 0.1–0.5)
Total Protein: 6.3 g/dL — ABNORMAL LOW (ref 6.5–8.1)

## 2017-12-14 LAB — BASIC METABOLIC PANEL
Anion gap: 13 (ref 5–15)
BUN: 31 mg/dL — AB (ref 6–20)
CO2: 23 mmol/L (ref 22–32)
CREATININE: 1.42 mg/dL — AB (ref 0.61–1.24)
Calcium: 9.1 mg/dL (ref 8.9–10.3)
Chloride: 104 mmol/L (ref 101–111)
GFR calc Af Amer: 52 mL/min — ABNORMAL LOW (ref >60)
GFR calc non Af Amer: 45 mL/min — ABNORMAL LOW (ref >60)
GLUCOSE: 138 mg/dL — AB (ref 65–99)
POTASSIUM: 4.3 mmol/L (ref 3.5–5.1)
Sodium: 140 mmol/L (ref 135–145)

## 2017-12-14 LAB — URINALYSIS, ROUTINE W REFLEX MICROSCOPIC
Bilirubin Urine: NEGATIVE
GLUCOSE, UA: NEGATIVE mg/dL
HGB URINE DIPSTICK: NEGATIVE
KETONES UR: 5 mg/dL — AB
Leukocytes, UA: NEGATIVE
Nitrite: NEGATIVE
PH: 7 (ref 5.0–8.0)
PROTEIN: NEGATIVE mg/dL
Specific Gravity, Urine: 1.013 (ref 1.005–1.030)

## 2017-12-14 LAB — CBC
HEMATOCRIT: 36.1 % — AB (ref 39.0–52.0)
Hemoglobin: 12 g/dL — ABNORMAL LOW (ref 13.0–17.0)
MCH: 30.8 pg (ref 26.0–34.0)
MCHC: 33.2 g/dL (ref 30.0–36.0)
MCV: 92.6 fL (ref 78.0–100.0)
PLATELETS: 132 10*3/uL — AB (ref 150–400)
RBC: 3.9 MIL/uL — ABNORMAL LOW (ref 4.22–5.81)
RDW: 13.7 % (ref 11.5–15.5)
WBC: 12.8 10*3/uL — ABNORMAL HIGH (ref 4.0–10.5)

## 2017-12-14 LAB — CBG MONITORING, ED: Glucose-Capillary: 105 mg/dL — ABNORMAL HIGH (ref 65–99)

## 2017-12-14 LAB — TROPONIN I
Troponin I: <0.03 ng/mL (ref <0.03)
Troponin I: <0.03 ng/mL (ref <0.03)

## 2017-12-14 LAB — GLUCOSE, CAPILLARY: GLUCOSE-CAPILLARY: 332 mg/dL — AB (ref 65–99)

## 2017-12-14 MED ORDER — NITROGLYCERIN 0.4 MG SL SUBL: 0.4000 mg | SUBLINGUAL_TABLET | SUBLINGUAL | Status: DC | PRN

## 2017-12-14 MED ORDER — INSULIN ASPART 100 UNIT/ML ~~LOC~~ SOLN
0.0000 [IU] | Freq: Three times a day (TID) | SUBCUTANEOUS | Status: DC
  Administered 2017-12-15: 2 [IU] via SUBCUTANEOUS
  Administered 2017-12-15: 3 [IU] via SUBCUTANEOUS

## 2017-12-14 MED ORDER — TAMSULOSIN HCL 0.4 MG PO CAPS: 0.4000 mg | ORAL_CAPSULE | Freq: Every day | ORAL | Status: DC

## 2017-12-14 MED ORDER — SODIUM CHLORIDE 0.9 % IV BOLUS
500.0000 mL | Freq: Once | INTRAVENOUS | Status: AC
  Administered 2017-12-14: 500 mL via INTRAVENOUS

## 2017-12-14 MED ORDER — HEPARIN SODIUM (PORCINE) 5000 UNIT/ML IJ SOLN
5000.0000 [IU] | Freq: Three times a day (TID) | INTRAMUSCULAR | Status: DC
  Administered 2017-12-15 (×3): 5000 [IU] via SUBCUTANEOUS
  Filled 2017-12-14 (×3): qty 1

## 2017-12-14 MED ORDER — PANTOPRAZOLE SODIUM 40 MG PO TBEC
40.0000 mg | DELAYED_RELEASE_TABLET | Freq: Every day | ORAL | Status: DC
  Administered 2017-12-15: 40 mg via ORAL
  Filled 2017-12-14: qty 1

## 2017-12-14 MED ORDER — SODIUM CHLORIDE 0.9% FLUSH
3.0000 mL | Freq: Two times a day (BID) | INTRAVENOUS | Status: DC
  Administered 2017-12-15: 3 mL via INTRAVENOUS

## 2017-12-14 MED ORDER — SODIUM CHLORIDE 0.9 % IV SOLN: 250.0000 mL | INTRAVENOUS | Status: DC | PRN

## 2017-12-14 MED ORDER — GLIPIZIDE 5 MG PO TABS
5.0000 mg | ORAL_TABLET | Freq: Two times a day (BID) | ORAL | Status: DC
  Administered 2017-12-15: 5 mg via ORAL
  Filled 2017-12-14: qty 1

## 2017-12-14 MED ORDER — ATORVASTATIN CALCIUM 20 MG PO TABS
20.0000 mg | ORAL_TABLET | Freq: Every day | ORAL | Status: DC
  Administered 2017-12-15: 20 mg via ORAL
  Filled 2017-12-14: qty 1

## 2017-12-14 MED ORDER — MELATONIN 3 MG PO TABS
6.0000 mg | ORAL_TABLET | Freq: Every day | ORAL | Status: DC
  Administered 2017-12-15: 6 mg via ORAL
  Filled 2017-12-14: qty 2

## 2017-12-14 MED ORDER — ACETAMINOPHEN 325 MG PO TABS: 325.0000 mg | ORAL_TABLET | Freq: Four times a day (QID) | ORAL | Status: DC | PRN

## 2017-12-14 MED ORDER — DORZOLAMIDE HCL-TIMOLOL MAL 2-0.5 % OP SOLN
1.0000 [drp] | Freq: Two times a day (BID) | OPHTHALMIC | Status: DC
  Administered 2017-12-15 (×2): 1 [drp] via OPHTHALMIC
  Filled 2017-12-14: qty 10

## 2017-12-14 MED ORDER — BRIMONIDINE TARTRATE 0.2 % OP SOLN
1.0000 [drp] | Freq: Two times a day (BID) | OPHTHALMIC | Status: DC
  Administered 2017-12-15 (×2): 1 [drp] via OPHTHALMIC
  Filled 2017-12-14: qty 5

## 2017-12-14 MED ORDER — ASPIRIN EC 81 MG PO TBEC
81.0000 mg | DELAYED_RELEASE_TABLET | Freq: Every day | ORAL | Status: DC
  Administered 2017-12-15: 81 mg via ORAL
  Filled 2017-12-14: qty 1

## 2017-12-14 MED ORDER — SODIUM CHLORIDE 0.9% FLUSH: 3.0000 mL | INTRAVENOUS | Status: DC | PRN

## 2017-12-14 MED ORDER — LATANOPROST 0.005 % OP SOLN
1.0000 [drp] | Freq: Every day | OPHTHALMIC | Status: DC
  Administered 2017-12-15: 1 [drp] via OPHTHALMIC
  Filled 2017-12-14: qty 2.5

## 2017-12-14 MED ORDER — ONDANSETRON HCL 4 MG/2ML IJ SOLN: 4.0000 mg | Freq: Four times a day (QID) | INTRAMUSCULAR | Status: DC | PRN

## 2017-12-14 MED ORDER — LOSARTAN POTASSIUM 50 MG PO TABS
50.0000 mg | ORAL_TABLET | Freq: Two times a day (BID) | ORAL | Status: DC
  Administered 2017-12-15 (×2): 50 mg via ORAL
  Filled 2017-12-14 (×3): qty 1

## 2017-12-14 MED ORDER — LEVOTHYROXINE SODIUM 75 MCG PO TABS
75.0000 ug | ORAL_TABLET | Freq: Every day | ORAL | Status: DC
  Administered 2017-12-15: 75 ug via ORAL
  Filled 2017-12-14: qty 1

## 2017-12-14 MED ORDER — BUSPIRONE HCL 5 MG PO TABS
15.0000 mg | ORAL_TABLET | Freq: Two times a day (BID) | ORAL | Status: DC
  Administered 2017-12-15 (×2): 15 mg via ORAL
  Filled 2017-12-14 (×2): qty 3

## 2017-12-14 MED ORDER — CLOPIDOGREL BISULFATE 75 MG PO TABS
75.0000 mg | ORAL_TABLET | Freq: Every day | ORAL | Status: DC
  Administered 2017-12-15: 75 mg via ORAL
  Filled 2017-12-14: qty 1

## 2017-12-14 MED ORDER — FINASTERIDE 5 MG PO TABS
5.0000 mg | ORAL_TABLET | Freq: Every day | ORAL | Status: DC
  Administered 2017-12-15: 5 mg via ORAL
  Filled 2017-12-14: qty 1

## 2017-12-14 NOTE — ED Notes (Signed)
Dr. Rees at bedside  

## 2017-12-14 NOTE — Patient Outreach (Signed)
[facility]) Care Management  12/14/2017  Chad Rollins 11/28/1935 [id number]  82 year old male referred to [facility] services for 30 day post discharge medication review.  PMHx includes, but not limited to, severe aortic stenosis, hypertension, Type 2 diabetes mellitus, hypothyroidism, rheumatoid arthritis, BPH, h/o CVA, and hyperlipidemia.  Successful outreach placed to [name]'s phone, which was answered by his daughter [name] who is listed a contact person.  HIPAA identifiers verified.   [name] states that is at work today, but off Monday and requests that I call her back then.  Plan: Outreach call on Monday 6/17 at 1:30 pm.  Joetta Manners, PharmD [place] [phone]

## 2017-12-14 NOTE — ED Notes (Signed)
Went to pt room after speaking to [employer], states pt and family is upset about having to wait "45 min for someone to answer the call out" pt reassured and situation addressed. This RN was in pt room and no one at the desk as we were all in rooms. Pt calm and content after conversation

## 2017-12-14 NOTE — ED Provider Notes (Signed)
[place] EMERGENCY DEPARTMENT Provider Note   CSN: [id number] Arrival date & time: 12/14/17  1116     History   Chief Complaint Chief Complaint  Patient presents with  . Weakness    HPI Chad Rollins is a 82 y.o. male.  HPI  81 yo male ho cad s/p valve  (TAVR) last Tuesday home Friday-and has been doing ok.  Felt weak this am when he awoke.  Able to walk and ate breakfast.  Weakness generally, denies light headedness or syncope.  No pain, mild dyspnea, cough, fever, or chills, normal urination, no change in bowel movements.  Past Medical History:  Diagnosis Date  . BPH with obstruction/lower urinary tract symptoms   . CAD (coronary artery disease)    a. 11/06/17 PCI/DES x1 to mRCA.  Marland Kitchen Continuous right lower quadrant pain   . Diabetes mellitus ([place])   . HLD (hyperlipidemia)   . HTN (hypertension)   . Hypothyroidism   . Injury of right rotator cuff   . Obesity   . Osteoarthritis   . Peripheral neuropathy   . Rheumatoid arthritis ([place])   . Severe aortic stenosis   . Stroke [facility])     Patient Active Problem List   Diagnosis Date Noted  . S/P TAVR (transcatheter aortic valve replacement) 12/05/2017  . HTN (hypertension)   . CAD (coronary artery disease)   . Hypothyroidism   . Rheumatoid arthritis ([place])   . BPH with obstruction/lower urinary tract symptoms 01/21/2015  . Right lower quadrant abdominal pain 01/21/2015  . Glaucoma 12/08/2014  . Type 2 diabetes mellitus ([place]) 12/08/2014  . Insomnia, persistent 12/16/2013  . Peripheral arterial occlusive disease ([place]) 12/16/2013  . Cerebrovascular accident, old 12/14/2013  . HLD (hyperlipidemia) 12/14/2013  . Peripheral nerve disease 12/14/2013  . Severe aortic stenosis 08/13/2013  . Difficulty hearing 08/12/2013    Past Surgical History:  Procedure Laterality Date  . CHOLECYSTECTOMY    . CORONARY STENT INTERVENTION N/A 11/06/2017   Procedure: CORONARY STENT INTERVENTION;  Surgeon: Sherren Mocha, MD;  Location: [facility] CV LAB;  Service: Cardiovascular;  Laterality: N/A;  . INTRAOPERATIVE TRANSTHORACIC ECHOCARDIOGRAM N/A 12/05/2017   Procedure: INTRAOPERATIVE TRANSTHORACIC ECHOCARDIOGRAM;  Surgeon: Sherren Mocha, MD;  Location: [facility];  Service: Open Heart Surgery;  Laterality: N/A;  . Lipoma excisions    . RIGHT/LEFT HEART CATH AND CORONARY ANGIOGRAPHY N/A 11/06/2017   Procedure: RIGHT/LEFT HEART CATH AND CORONARY ANGIOGRAPHY;  Surgeon: Sherren Mocha, MD;  Location: [place] CV LAB;  Service: Cardiovascular;  Laterality: N/A;  . Surgery for sleep apnea    . TONSILLECTOMY    . TRANSCATHETER AORTIC VALVE REPLACEMENT, TRANSFEMORAL N/A 12/05/2017   Procedure: TRANSCATHETER AORTIC VALVE REPLACEMENT, TRANSFEMORAL;  Surgeon: Sherren Mocha, MD;  Location: [place];  Service: Open Heart Surgery;  Laterality: N/A;        Home Medications    Prior to Admission medications   Medication Sig Start Date End Date Taking? Authorizing Provider  aspirin EC 81 MG tablet Take 81 mg by mouth daily.    [provider]  atorvastatin (LIPITOR) 20 MG tablet Take 20 mg by mouth at bedtime.     [provider]  brimonidine (ALPHAGAN) 0.2 % ophthalmic solution Place 1 drop into the left eye 2 (two) times daily.    [provider]  busPIRone (BUSPAR) 15 MG tablet Take 15 mg by mouth 2 (two) times daily.     [provider]  Cholecalciferol (VITAMIN D3) 2000 units  capsule Take 2,000 Units by mouth 2 (two) times daily.     [provider]  clopidogrel (PLAVIX) 75 MG tablet Take 75 mg by mouth at bedtime.     [provider]  dorzolamide-timolol (COSOPT) 22.3-6.8 MG/ML ophthalmic solution Place 1 drop into both eyes 2 (two) times daily.    [provider]  finasteride (PROSCAR) 5 MG tablet Take 1 tablet (5 mg total) by mouth daily. 05/05/17   Zara Council A, PA-C  furosemide (LASIX) 40 MG tablet Take 40 mg by mouth daily.    [provider]  glipiZIDE (GLUCOTROL) 5 MG tablet Take 5 mg by mouth 2 (two) times daily before a meal.     [provider]  latanoprost (XALATAN) 0.005 % ophthalmic solution Place 1 drop into both eyes at bedtime.     [provider]  levothyroxine (SYNTHROID, LEVOTHROID) 75 MCG tablet Take 75 mcg by mouth daily before breakfast.    [provider]  losartan (COZAAR) 50 MG tablet Take 50 mg by mouth 2 (two) times daily.     [provider]  Melatonin 5 MG TABS Take 5 mg by mouth at bedtime.     [provider]  metFORMIN (GLUCOPHAGE) 1000 MG tablet Take 1,000 mg by mouth 2 (two) times daily with a meal.    [provider]  Multiple Vitamins-Minerals (PRESERVISION AREDS 2) CAPS Take 1 capsule by mouth 2 (two) times daily.    [provider]  nitroGLYCERIN (NITROSTAT) 0.4 MG SL tablet Place 1 tablet (0.4 mg total) under the tongue every 5 (five) minutes as needed. Patient taking differently: Place 0.4 mg under the tongue every 5 (five) minutes as needed for chest pain.  11/07/17   Cheryln Manly, NP  nystatin (MYCOSTATIN) powder Apply 1 g topically 2 (two) times daily as needed (rash under arms).     [provider]  pantoprazole (PROTONIX) 40 MG tablet Take 1 tablet (40 mg total) by mouth daily. 09/18/15   Gladstone Lighter, MD  tamsulosin (FLOMAX) 0.4 MG CAPS capsule Take 1 capsule (0.4 mg total) by mouth daily after supper. 01/25/16   Zara Council A, PA-C  vitamin B-12 (CYANOCOBALAMIN) 1000 MCG tablet Take 1,000 mcg by mouth at bedtime.    [provider]    Family History Family History  Problem Relation Age of Onset  . Diabetes Mother   . Hypertension Mother   . Prostate cancer Neg Hx   . Bladder Cancer Neg Hx   . Kidney disease Neg Hx     Social History Social History   Tobacco Use  . Smoking status: Former Smoker    Last attempt to quit: 10/14/1973    Years since quitting: 44.1  . Smokeless  tobacco: Former Systems developer  . Tobacco comment: quit 1975  Substance Use Topics  . Alcohol use: No    Alcohol/week: 0.0 oz  . Drug use: No     Allergies   Acetaminophen   Review of Systems Review of Systems  All other systems reviewed and are negative.    Physical Exam Updated Vital Signs BP 135/69   Pulse 77   Temp 97.6 F (36.4 C) (Oral)   Resp (!) 23   Ht 1.803 m (5\' 11" )   Wt 92.1 kg (203 lb)   SpO2 100%   BMI 28.31 kg/m   Physical Exam  Constitutional: He is oriented to person, place, and time. He appears well-developed and well-nourished.  HENT:  Head:  Normocephalic and atraumatic.  Eyes:  Low vision- legally blind- macular degeneration and glaucoma  Neck: Normal range of motion.  Cardiovascular: Normal rate and regular rhythm.  Pulmonary/Chest: Effort normal and breath sounds normal.  Abdominal: Soft. Bowel sounds are normal.  Musculoskeletal: Normal range of motion.  Neurological: He is alert and oriented to person, place, and time.  Skin: Skin is warm and dry. Capillary refill takes less than 2 seconds. Rash noted.  Rash right lower extremity, lacy non blanching below right groin puncture site- inguinal contusion noted  Nursing note and vitals reviewed.        ED Treatments / Results  Labs (all labs ordered are listed, but only abnormal results are displayed) Labs Reviewed  CBC - Abnormal; Notable for the following components:      Result Value   WBC 12.8 (*)    RBC 3.90 (*)    Hemoglobin 12.0 (*)    HCT 36.1 (*)    Platelets 132 (*)    All other components within normal limits  BASIC METABOLIC PANEL  URINALYSIS, ROUTINE W REFLEX MICROSCOPIC  TROPONIN I  CBG MONITORING, ED    EKG EKG Interpretation  Date/Time:  Thursday December 14 2017 11:21:41 EDT Ventricular Rate:  77 PR Interval:    QRS Duration: 167 QT Interval:  440 QTC Calculation: 498 R Axis:   -72 Text Interpretation:  Normal sinus rhythm prolonged  pr interval No significant  change since last tracing Confirmed by Pattricia Boss [phone]) on 12/14/2017 12:53:56 PM   Radiology Dg Chest Port 1 View  Result Date: 12/14/2017 CLINICAL DATA:  Encounter for weakness and recent tvar EXAM: PORTABLE CHEST 1 VIEW COMPARISON:  12/06/2017 FINDINGS: Normal mediastinum and cardiac silhouette. Aortic valve replacement noted. Normal pulmonary vasculature. No evidence of effusion, infiltrate, or pneumothorax. No acute bony abnormality. IMPRESSION: No acute cardiopulmonary process. Electronically Signed   By: Suzy Bouchard M.D.   On: 12/14/2017 13:48    Procedures Procedures (including critical care time)  Medications Ordered in ED Medications - No data to display   Initial Impression / Assessment and Plan / ED Course  I have reviewed the triage vital signs and the nursing notes.  Pertinent labs & imaging results that were available during my care of the patient were reviewed by me and considered in my medical decision making (see chart for details).  Clinical Course as of Dec 15 1442  Thu Dec 14, 2017  8527 Basic metabolic panel(!) [DR]  7824 HCT(!): 36.1 [DR]  1401 Glucose: NEGATIVE [DR]  1401 Troponin I: <0.03 [DR]  2353 Cxr reviewed   [DR]  1402 Orthostatic vitals reviewed- no standing noted   [DR]    Clinical Course User Index [DR] Pattricia Boss, MD    Discussed with Dr. Harrington Challenger and they will admit  Final Clinical Impressions(s) / ED Diagnoses   Final diagnoses:  Weakness  Rash  Aortic valve replaced    ED Discharge Orders    None       Pattricia Boss, MD 12/14/17 6144

## 2017-12-14 NOTE — Telephone Encounter (Signed)
He had a left bundle branch block when he left the hospital. We will follow up on ED notes.

## 2017-12-14 NOTE — H&P (Addendum)
History & Physical    Patient ID: Chad Rollins MRN: [id number], DOB/AGE: [birth year]   Admit date: 12/14/2017   Primary Physician: Idelle Crouch, MD Primary Cardiologist: K. Ubaldo Glassing, MD / TAVR - M. Burt Knack, [place]. Cyndia Bent, MD  Patient Profile    82 y/o ? with a h/o CAD s/p recent DES  RCA, Ao Stenosis s/p recent TAVR with post-procedure LBBB and 1st deb AVB, HTN, HL, hypothyroidism, prior strokes, and RA who presented to the ED today with a 2 day h/o weakness.  Past Medical History    Past Medical History:  Diagnosis Date  . BPH with obstruction/lower urinary tract symptoms   . CAD (coronary artery disease)    a. 11/2017 PCI: LM nl, LAD mild diff dzs, LCX, mild diff dzs, OM1 small, 99ost, RCA 90p/m (3.5x18 Synergy DES).  . Continuous right lower quadrant pain   . Diabetes mellitus ([place])   . HLD (hyperlipidemia)   . HTN (hypertension)   . Hypothyroidism   . Injury of right rotator cuff   . LBBB (left bundle branch block)    a. developed post-TAVR 12/2017.  . Obesity   . Osteoarthritis   . Peripheral neuropathy   . Rheumatoid arthritis ([place])   . Severe aortic stenosis    a. 12/2017 s/p TAVR w/ Edwards Sapien 3 (size 3mm, model #9600TFX, [phone]); b. 12/2017 Echo: nl fxning AoV w/ no PVL. Mean gradient 39mmHg.  . Stroke [facility])     Past Surgical History:  Procedure Laterality Date  . CHOLECYSTECTOMY    . CORONARY STENT INTERVENTION N/A 11/06/2017   Procedure: CORONARY STENT INTERVENTION;  Surgeon: Sherren Mocha, MD;  Location: [place] [facility];  Service: Cardiovascular;  Laterality: N/A;  . INTRAOPERATIVE TRANSTHORACIC ECHOCARDIOGRAM N/A 12/05/2017   Procedure: INTRAOPERATIVE TRANSTHORACIC ECHOCARDIOGRAM;  Surgeon: Sherren Mocha, MD;  Location: [place];  Service: Open Heart Surgery;  Laterality: N/A;  . Lipoma excisions    . RIGHT/LEFT HEART CATH AND CORONARY ANGIOGRAPHY N/A 11/06/2017   Procedure: RIGHT/LEFT HEART CATH AND CORONARY ANGIOGRAPHY;  Surgeon: Sherren Mocha,  MD;  Location: [place] [facility];  Service: Cardiovascular;  Laterality: N/A;  . Surgery for sleep apnea    . TONSILLECTOMY    . TRANSCATHETER AORTIC VALVE REPLACEMENT, TRANSFEMORAL N/A 12/05/2017   Procedure: TRANSCATHETER AORTIC VALVE REPLACEMENT, TRANSFEMORAL;  Surgeon: Sherren Mocha, MD;  Location: [place];  Service: Open Heart Surgery;  Laterality: N/A;     Allergies  Allergies  Allergen Reactions  . Acetaminophen Other (See Comments)    Long period of time, effects kidney    History of Present Illness    82 y/o ? with the above complex PMH including CAD status post recent RCA stenting, aortic stenosis status post recent TAVR with postprocedure left bundle branch block and first-degree AV block, hypertension, hyperlipidemia, hypothyroidism, prior strokes, and rheumatoid arthritis.  Patient was evaluated for severe aortic stenosis after echo in February showed such.  He was referred to Dr. Burt Knack and seen in April in the setting of progressive functional decline.   he subsequently underwent diagnostic catheterization in May which showed severe obtuse marginal and RCA disease.  The obtuse marginal small while the RCA was felt to be amenable to PCI and he subsequently underwent drug-eluting stent placement to the RCA.  He tolerated the procedure well and was readmitted on June 4 and underwent successful TAVR via a right femoral approach.  Postprocedure echo showed stable prosthesis with no perivalvular leak.  Postprocedure course was  complicated by a new left bundle branch block and first-degree AV block.  He did initially have some bradycardia and hypotension which subsequently resolved.  He never required pacing in his home dose of beta-blocker was able to be resumed at discharge.  He also had some nausea and vomiting on the morning of June 6 but this subsequently resolved.  He was discharged home on June 7.  He says that he was feeling well and was ambulating in his home with his walker without  any symptoms or significant limitations.  Beginning on June 12 however he began to note generalized malaise and weakness, stating that he just had no get up and go.  This was worse this morning, prompting him to hit his medical alert button.  When EMTs came out to his house they noted left bundle branch block and became concerned.  As result, he was transferred to the emergency department here at [facility].  Here, he is in sinus rhythm without any significant bradycardia or pauses.  Chest x-ray shows no active cardiopulmonary disease.  H&H, and electrolytes are stable.  Creatinine is mildly elevated above baseline at 1.42.  Troponin is normal x2.  Urinalysis negative.  He denies chest pain, dyspnea, palpitations, PND, orthopnea, dizziness, syncope, edema, or early satiety.  He has had some erythema over his right thigh since the procedure.  Home Medications    Prior to Admission medications   Medication Sig Start Date End Date Taking? Authorizing Provider  aspirin EC 81 MG tablet Take 81 mg by mouth daily.   Yes [provider]  atorvastatin (LIPITOR) 20 MG tablet Take 20 mg by mouth at bedtime.    Yes [provider]  brimonidine (ALPHAGAN) 0.2 % ophthalmic solution Place 1 drop into both eyes 2 (two) times daily.    Yes [provider]  busPIRone (BUSPAR) 15 MG tablet Take 15 mg by mouth 2 (two) times daily.    Yes [provider]  Cholecalciferol (VITAMIN D3) 2000 units capsule Take 2,000 Units by mouth 2 (two) times daily.    Yes [provider]  clopidogrel (PLAVIX) 75 MG tablet Take 75 mg by mouth at bedtime.    Yes [provider]  dorzolamide-timolol (COSOPT) 22.3-6.8 MG/ML ophthalmic solution Place 1 drop into both eyes 2 (two) times daily.   Yes [provider]  finasteride (PROSCAR) 5 MG tablet Take 1 tablet (5 mg total) by mouth daily. 05/05/17  Yes McGowan, Larene Beach A, PA-C  furosemide (LASIX) 40 MG tablet Take 40 mg by mouth daily.    Yes [provider]  glipiZIDE (GLUCOTROL) 5 MG tablet Take 5 mg by mouth 2 (two) times daily before a meal.    Yes [provider]  latanoprost (XALATAN) 0.005 % ophthalmic solution Place 1 drop into both eyes at bedtime.    Yes [provider]  levothyroxine (SYNTHROID, LEVOTHROID) 75 MCG tablet Take 75 mcg by mouth daily before breakfast.   Yes [provider]  losartan (COZAAR) 50 MG tablet Take 50 mg by mouth 2 (two) times daily.    Yes [provider]  Melatonin 5 MG TABS Take 5 mg by mouth at bedtime.    Yes [provider]  metFORMIN (GLUCOPHAGE) 1000 MG tablet Take 1,000 mg by mouth 2 (two) times daily with a meal.   Yes [provider]  Multiple Vitamins-Minerals (PRESERVISION AREDS 2) CAPS Take 1 capsule by mouth 2 (two) times daily.   Yes [provider]  nitroGLYCERIN (NITROSTAT) 0.4 MG SL tablet Place 1 tablet (0.4 mg total) under the tongue every 5 (five) minutes as needed. Patient taking differently: Place 0.4 mg under the tongue every 5 (five) minutes as needed for chest pain.  11/07/17  Yes Reino Bellis B, NP  pantoprazole (PROTONIX) 40 MG tablet Take 1 tablet (40 mg total) by mouth daily. 09/18/15  Yes Gladstone Lighter, MD  tamsulosin (FLOMAX) 0.4 MG CAPS capsule Take 1 capsule (0.4 mg total) by mouth daily after supper. 01/25/16  Yes McGowan, Larene Beach A, PA-C  vitamin B-12 (CYANOCOBALAMIN) 1000 MCG tablet Take 1,000 mcg by mouth at bedtime.   Yes [provider]    Family History    Family History  Problem Relation Age of Onset  . Diabetes Mother   . Hypertension Mother   . Prostate cancer Neg Hx   . Bladder Cancer Neg Hx   . Kidney disease Neg Hx    indicated that the status of his mother is unknown. He indicated that the status of his neg hx is unknown.   Social History    Social History   Socioeconomic History  . Marital status: Widowed    Spouse name: Not on file  . Number of  children: Not on file  . Years of education: Not on file  . Highest education level: Not on file  Occupational History  . Occupation: retired  Scientific laboratory technician  . Financial resource strain: Not on file  . Food insecurity:    Worry: Not on file    Inability: Not on file  . Transportation needs:    Medical: Not on file    Non-medical: Not on file  Tobacco Use  . Smoking status: Former Smoker    Last attempt to quit: 10/14/1973    Years since quitting: 44.1  . Smokeless tobacco: Former Systems developer  . Tobacco comment: quit 1975  Substance and Sexual Activity  . Alcohol use: No    Alcohol/week: 0.0 oz  . Drug use: No  . Sexual activity: Not on file  Lifestyle  . Physical activity:    Days per week: Not on file    Minutes per session: Not on file  . Stress: Not on file  Relationships  . Social connections:    Talks on phone: Not on file    Gets together: Not on file    Attends religious service: Not on file    Active member of club or organization: Not on file    Attends meetings of clubs or organizations: Not on file    Relationship status: Not on file  . Intimate partner violence:    Fear of current or ex partner: Not on file    Emotionally abused: Not on file    Physically abused: Not on file    Forced sexual activity: Not on file  Other Topics Concern  . Not on file  Social History Narrative  . Not on file     Review of Systems    General: Malaise and weakness.  No chills, fever, night sweats or weight changes.  Cardiovascular:  No chest pain, dyspnea on exertion, edema, orthopnea, palpitations, paroxysmal nocturnal dyspnea. Dermatological: No rash, lesions/masses Respiratory: No cough, dyspnea Urologic: No hematuria, dysuria Abdominal:   No nausea, vomiting, diarrhea, bright red blood per rectum, melena, or hematemesis Neurologic:  No visual changes, wkns, changes in mental status. All other systems reviewed and are otherwise negative except as noted above.  Physical  Exam    Blood  pressure (!) 166/73, pulse 84, temperature 97.7 F (36.5 C), temperature source Rectal, resp. rate (!) 21, height 5\' 11"  (1.803 m), weight 203 lb (92.1 kg), SpO2 100 %.  General: Pleasant, NAD Psych: Normal affect. Neuro: Alert and oriented X 3. Moves all extremities spontaneously. HEENT: Normal  Neck: Supple without bruits or JVD. Lungs:  Resp regular and unlabored, CTA. Heart: RRR no s3, s4, 2/6 systolic ejection murmur at the right upper sternal border.  No diastolic murmur. Abdomen: Soft, non-tender, non-distended, BS + x 4.  Extremities: No clubbing, cyanosis or edema.  Somewhat diffuse small erythematous patches on the right thigh and lower leg.  Right groin site without bleeding, bruit, or hematoma.  DP/PT/Radials 2+ and equal bilaterally.  Labs     Recent Labs    12/14/17 1129 12/14/17 1502  TROPONINI <0.03 <0.03   Lab Results  Component Value Date   WBC 12.8 (H) 12/14/2017   HGB 12.0 (L) 12/14/2017   HCT 36.1 (L) 12/14/2017   MCV 92.6 12/14/2017   PLT 132 (L) 12/14/2017    Recent Labs  Lab 12/14/17 1129  NA 140  K 4.3  CL 104  CO2 23  BUN 31*  CREATININE 1.42*  CALCIUM 9.1  PROT 6.3*  BILITOT 1.2  ALKPHOS 70  ALT 19  AST 23  GLUCOSE 138*    Radiology Studies    Dg Chest Port 1 View  Result Date: 12/14/2017 CLINICAL DATA:  Encounter for weakness and recent tvar EXAM: PORTABLE CHEST 1 VIEW COMPARISON:  12/06/2017 FINDINGS: Normal mediastinum and cardiac silhouette. Aortic valve replacement noted. Normal pulmonary vasculature. No evidence of effusion, infiltrate, or pneumothorax. No acute bony abnormality. IMPRESSION: No acute cardiopulmonary process. Electronically Signed   By: Suzy Bouchard M.D.   On: 12/14/2017 13:48   ECG & Cardiac Imaging    RSR, 77, 1st deg AVB, LAD, LBBB.  Assessment & Plan    1.  Weakness: Patient presents with a 2-day history of vague weakness, stating he simply has no "get up and go."  He has not had any  chest pain or dyspnea.  His white count is mildly elevated at 12.8, which is actually down since his discharge.  Urinalysis is negative.  Chest x-ray without acute cardia pulmonary processes.  He is in sinus rhythm without any significant bradycardia despite first-degree AV block and left bundle branch block.  We will observe tonight and follow-up limited echo to reevaluate valve.  I will send off blood cultures.  2.  Aortic stenosis: Status post recent TAVR.  Postop echo showed stable functioning valve without any perivalvular leak.  Follow-up limited echo in the setting of above.  3.  Coronary artery disease: Status post PCI of the RCA in May.  He has not had any chest pain.  Troponins normal.  Continue aspirin, beta-blocker, Plavix, and statin.  4.  Essential hypertension: Blood pressure elevated in emergency department.  Resume home meds and follow/titrate.  5.  Hyperlipidemia: Continue statin therapy.  LDL was 64 in February 2019.  6.  Leukocytosis:  Trending down since discharge.  No fevers/chills.  UA neg.  CXR ok.  Will check blood cultures in setting of recent invasive procedures and vague weakness.  7.  DM II:  Add SSI.  8.  Mild renal insuff:  Creat up from baseline @ 1.42.  He is on lasix 40 daily @ home.  I will hold for the time being.  9.  R leg rash: diffuse non-raised erythematous areas covering  the right leg. Does not blanch. Not noted on left leg or anywhere else on body.  ? Potential emboli following RFA procedure.  Dr. Harrington Challenger to d/w vascular.  Good distal pulses and cap refill.  Signed, Murray Hodgkins, NP 12/14/2017, 6:06 PM   Patient seen and examined    I agree with findings as noted by Angelica Ran above Pt is an 82 yo with history of CAD as noted   Recent TAVR    Presents to ED not feeling good   Nonspecific  No energy On exam, pt afebrile  Neck:  JVP is normal   Lungs CTA   Cardiac RRR I/VI systolic murmu   No diastolic murmurs  Abd Benign   Ext with no edema   2+ pulses    R leg with erythematous rash.  EKG with SR first degree AV block  LBBB Tele:  Rates 70s    Labs signif for elevated WBC (stable)  UA:   SG 1.013    Neg nitrite      BUN/Cr  31/1.42  Impression:Pt with vague complaints  Labs sugg patient is a little dry. This may explain  WIll hold diuretic   Hydrate gently Rash on leg concerning for embolic event   Pulses are good  Plan for blood culturs    Continue telemetry   Limited echo in AM  [employer]

## 2017-12-14 NOTE — Telephone Encounter (Signed)
I spoke with Chad Rollins (dpr on file) she states patient pressed his life line button stating he felt weak. Once EMS arrived they mentioned a left bundle branch block and pt is in route by EMS to be evaluated. Informed her I would send to Dr. Burt Knack, Nell Range and the nurses. She stated understanding and thankful for the call.

## 2017-12-14 NOTE — ED Notes (Signed)
Pt assisted to stand to use urinal.

## 2017-12-14 NOTE — Telephone Encounter (Signed)
New Message:      FYI: Pt's daughter is calling and states the pt is on his way back to the hosp in ambulance due to a lft bundle block.

## 2017-12-14 NOTE — ED Triage Notes (Addendum)
Pt here via EMS, for weakness. States he has been feeling weak since morning. Stent placed mid RCA on 6/4. Left BB on EKG noted by EMS. No history of this. HR 82, 159/91, 99% room air. 20 L FA. 0/10 pain. Denies chest pain.

## 2017-12-15 ENCOUNTER — Observation Stay (HOSPITAL_BASED_OUTPATIENT_CLINIC_OR_DEPARTMENT_OTHER): Payer: PPO

## 2017-12-15 DIAGNOSIS — R531 Weakness: Secondary | ICD-10-CM | POA: Diagnosis not present

## 2017-12-15 DIAGNOSIS — I340 Nonrheumatic mitral (valve) insufficiency: Secondary | ICD-10-CM | POA: Diagnosis not present

## 2017-12-15 DIAGNOSIS — I350 Nonrheumatic aortic (valve) stenosis: Secondary | ICD-10-CM | POA: Diagnosis not present

## 2017-12-15 DIAGNOSIS — I10 Essential (primary) hypertension: Secondary | ICD-10-CM | POA: Diagnosis not present

## 2017-12-15 DIAGNOSIS — I2510 Atherosclerotic heart disease of native coronary artery without angina pectoris: Secondary | ICD-10-CM | POA: Diagnosis not present

## 2017-12-15 LAB — ECHOCARDIOGRAM LIMITED
Height: 72 in
WEIGHTICAEL: 3171.1 [oz_av]

## 2017-12-15 LAB — BASIC METABOLIC PANEL
Anion gap: 8 (ref 5–15)
Anion gap: 9 (ref 5–15)
BUN: 24 mg/dL — AB (ref 6–20)
BUN: 26 mg/dL — AB (ref 6–20)
CHLORIDE: 107 mmol/L (ref 101–111)
CHLORIDE: 105 mmol/L (ref 101–111)
CO2: 26 mmol/L (ref 22–32)
CO2: 26 mmol/L (ref 22–32)
CREATININE: 1.24 mg/dL (ref 0.61–1.24)
Calcium: 8.8 mg/dL — ABNORMAL LOW (ref 8.9–10.3)
Calcium: 9 mg/dL (ref 8.9–10.3)
Creatinine, Ser: 1.32 mg/dL — ABNORMAL HIGH (ref 0.61–1.24)
GFR calc Af Amer: 57 mL/min — ABNORMAL LOW (ref >60)
GFR calc Af Amer: >60 mL/min (ref >60)
GFR calc non Af Amer: 49 mL/min — ABNORMAL LOW (ref >60)
GFR calc non Af Amer: 53 mL/min — ABNORMAL LOW (ref >60)
GLUCOSE: 162 mg/dL — AB (ref 65–99)
Glucose, Bld: 347 mg/dL — ABNORMAL HIGH (ref 65–99)
POTASSIUM: 4.1 mmol/L (ref 3.5–5.1)
POTASSIUM: 4 mmol/L (ref 3.5–5.1)
SODIUM: 140 mmol/L (ref 135–145)
Sodium: 141 mmol/L (ref 135–145)

## 2017-12-15 LAB — CBC
HCT: 36.9 % — ABNORMAL LOW (ref 39.0–52.0)
HEMOGLOBIN: 12 g/dL — AB (ref 13.0–17.0)
MCH: 30.7 pg (ref 26.0–34.0)
MCHC: 32.5 g/dL (ref 30.0–36.0)
MCV: 94.4 fL (ref 78.0–100.0)
Platelets: 145 10*3/uL — ABNORMAL LOW (ref 150–400)
RBC: 3.91 MIL/uL — AB (ref 4.22–5.81)
RDW: 13.5 % (ref 11.5–15.5)
WBC: 14.4 10*3/uL — ABNORMAL HIGH (ref 4.0–10.5)

## 2017-12-15 LAB — GLUCOSE, CAPILLARY
GLUCOSE-CAPILLARY: 179 mg/dL — AB (ref 65–99)
Glucose-Capillary: 145 mg/dL — ABNORMAL HIGH (ref 65–99)

## 2017-12-15 MED ORDER — HYDROCHLOROTHIAZIDE 25 MG PO TABS: 12.5000 mg | ORAL_TABLET | Freq: Every day | ORAL | 6 refills | Status: AC

## 2017-12-15 MED ORDER — HYDROCHLOROTHIAZIDE 12.5 MG PO CAPS: 12.5000 mg | ORAL_CAPSULE | Freq: Every day | ORAL | Status: DC

## 2017-12-15 MED ORDER — PERFLUTREN LIPID MICROSPHERE
1.0000 mL | INTRAVENOUS | Status: AC | PRN
  Administered 2017-12-15: 2 mL via INTRAVENOUS
  Filled 2017-12-15: qty 10

## 2017-12-15 NOTE — Progress Notes (Signed)
Pt had ambulated to 250 feet. Pt BP after ambulation at 135/61. Fabian Sharp Pa-C updated.

## 2017-12-15 NOTE — Progress Notes (Signed)
  Echocardiogram 2D Echocardiogram has been performed.  Chad Rollins 12/15/2017, 11:08 AM

## 2017-12-15 NOTE — Progress Notes (Signed)
Progress Note  Patient Name: NITHIN DEMEO Date of Encounter: 12/15/2017  Primary Cardiologist: Dr. Ubaldo Glassing ([facility]), Dr. Burt Knack (TAVR)  Subjective   No CP  No SOB   Feeling a little better   Inpatient Medications    Scheduled Meds: . aspirin EC  81 mg Oral Daily  . atorvastatin  20 mg Oral QHS  . brimonidine  1 drop Both Eyes BID  . busPIRone  15 mg Oral BID  . clopidogrel  75 mg Oral QHS  . dorzolamide-timolol  1 drop Both Eyes BID  . finasteride  5 mg Oral Daily  . glipiZIDE  5 mg Oral BID AC  . heparin  5,000 Units Subcutaneous Q8H  . insulin aspart  0-15 Units Subcutaneous TID WC  . latanoprost  1 drop Both Eyes QHS  . levothyroxine  75 mcg Oral QAC breakfast  . losartan  50 mg Oral BID  . Melatonin  6 mg Oral QHS  . pantoprazole  40 mg Oral Daily  . sodium chloride flush  3 mL Intravenous Q12H  . tamsulosin  0.4 mg Oral QPC supper   Continuous Infusions: . sodium chloride     PRN Meds: sodium chloride, acetaminophen, nitroGLYCERIN, ondansetron (ZOFRAN) IV, perflutren lipid microspheres (DEFINITY) IV suspension, sodium chloride flush   Vital Signs    Vitals:   12/14/17 2145 12/14/17 2157 12/14/17 2231 12/15/17 0523  BP: (!) 178/78  (!) 182/61 (!) 169/70  Pulse: 95 90 96 76  Resp: 16 14 20 18   Temp:   97.8 F (36.6 C) 97.8 F (36.6 C)  TempSrc:   Oral Oral  SpO2: 98% 98% 99% 100%  Weight:   199 lb (90.3 kg) 198 lb 3.1 oz (89.9 kg)  Height:   6' (1.829 m)     Intake/Output Summary (Last 24 hours) at 12/15/2017 1056 Last data filed at 12/15/2017 0923 Gross per 24 hour  Intake 225 ml  Output 950 ml  Net -725 ml   Filed Weights   12/14/17 1125 12/14/17 2231 12/15/17 0523  Weight: 203 lb (92.1 kg) 199 lb (90.3 kg) 198 lb 3.1 oz (89.9 kg)    Telemetry    SR  - Personally Reviewed  ECG      Physical Exam   GEN: No acute distress.   Neck: No JVD Cardiac: RRR, no murmurs, rubs, or gallops.  Respiratory: Clear to auscultation bilaterally. GI:  Soft, nontender, non-distended  MS: No edema; No deformity. Skin  Rash persists on R Leg   Neuro:  Nonfocal  Psych: Normal affect   Labs    Chemistry Recent Labs  Lab 12/14/17 1129 12/14/17 2359  NA 140 140  K 4.3 4.0  CL 104 105  CO2 23 26  GLUCOSE 138* 347*  BUN 31* 26*  CREATININE 1.42* 1.32*  CALCIUM 9.1 8.8*  PROT 6.3*  --   ALBUMIN 3.8  --   AST 23  --   ALT 19  --   ALKPHOS 70  --   BILITOT 1.2  --   GFRNONAA 45* 49*  GFRAA 52* 57*  ANIONGAP 13 9     Hematology Recent Labs  Lab 12/14/17 1129  WBC 12.8*  RBC 3.90*  HGB 12.0*  HCT 36.1*  MCV 92.6  MCH 30.8  MCHC 33.2  RDW 13.7  PLT 132*    Cardiac Enzymes Recent Labs  Lab 12/14/17 1129 12/14/17 1502  TROPONINI <0.03 <0.03   No results for input(s): TROPIPOC in the last 168  hours.   BNPNo results for input(s): BNP, PROBNP in the last 168 hours.   DDimer No results for input(s): DDIMER in the last 168 hours.   Radiology    Dg Chest Port 1 View  Result Date: 12/14/2017 CLINICAL DATA:  Encounter for weakness and recent tvar EXAM: PORTABLE CHEST 1 VIEW COMPARISON:  12/06/2017 FINDINGS: Normal mediastinum and cardiac silhouette. Aortic valve replacement noted. Normal pulmonary vasculature. No evidence of effusion, infiltrate, or pneumothorax. No acute bony abnormality. IMPRESSION: No acute cardiopulmonary process. Electronically Signed   By: Suzy Bouchard M.D.   On: 12/14/2017 13:48    Cardiac Studies   Left heart cath 11/06/17: 1. Severe 2 vessel CAD with severe stenosis of a large, dominant RCA and severe stenosis of a small OM branch 2. Severe aortic stenosis with mean gradient 54 mmHg and calculated AVA 0.83  Recommend: DAPT with ASA and clopidogrel x 6 months. Continue multidisciplinary evaluation for TAVR Medical Rx for residual CAD   TAVR 12/05/17  Post op echo 12/06/17: Study Conclusions - Procedure narrative: Transthoracic echocardiography. Image quality was poor. - Left  ventricle: The cavity size was normal. Wall thickness was increased in a pattern of moderate LVH. Systolic function was vigorous. The estimated ejection fraction was in the range of 65% to 70%. - Aortic valve: s/p Edwards Sapien 3 THV (26 mm) - no obvious paravalvular leak or obstruction. Mean gradient (S): 7 mm Hg. Peak gradient (S): 14 mm Hg. Valve area (VTI): 1.89 cm^2. Valve area (Vmax): 1.54 cm^2. Valve area (Vmean): 1.65 cm^2. - Pericardium, extracardiac: There was no pericardial effusion. Impressions: - Technically difficult study. Compared to a prior study on 12/05/2017, there has been interval placement of an Edwards Sapien 3 THV (26 mm) without paravalvular leak or obstruction.   Patient Profile     82 y.o. male with hx of CAD and is s/p DES to RCA, aortic stenosis s/p TAVR with post-procedure LBBB and first degree AVB, HTN, HLD, hypothyroidism, prior strokes and RA. He presented to [facility] with   Assessment & Plan    1. Weakness - WBC today pending, on admission was better than discharge 12/08/17 - CXR and UA clear - blood cultures pending Pt is feelling better overall   He may have just been dry  Urine SG was elevated   Will get labs  REview echo   If nothing positive consider d/c   Stop diuretics    2. Aortic stenosis s/p TAVR - post op echo with good valve function and no perivalvular leak - will reevaluate with limited echo   3. CAD s/p DES to RCA 11/2017 - troponins negative - no chest pain    4. Rash   - May represent some atheroembolization   Good pulses   Follow exam clinically   5  HTN   BP ws elevated last night    May need low dose of something   Consider amldipine 2.5 mg     F/U as outpt     Dorris Carnes

## 2017-12-15 NOTE — Discharge Summary (Signed)
Discharge Summary    Patient ID: Chad Rollins,  MRN: [id number], DOB/AGE: 80-Mar-1937 80 y.o.  Admit date: 12/14/2017 Discharge date: 12/15/2017  Primary Care Provider: Fulton Reek D Primary Cardiologist: Dr. Ubaldo Glassing (Teran Daughenbaugh), Dr. Burt Knack (TAVR)  Discharge Diagnoses    Principal Problem:   Weakness Active Problems:   Severe aortic stenosis   HLD (hyperlipidemia)   Peripheral arterial occlusive disease (HCC)   Type 2 diabetes mellitus (HCC)   HTN (hypertension)   CAD (coronary artery disease)   S/P TAVR (transcatheter aortic valve replacement)   Allergies Allergies  Allergen Reactions  . Acetaminophen Other (See Comments)    Long period of time, effects kidney    Diagnostic Studies/Procedures    Echo 12/15/17: Study Conclusions - Left ventricle: The cavity size was normal. Wall thickness was   normal. Systolic function was normal. The estimated ejection   fraction was in the range of 50% to 55%. Wall motion was normal;   there were no regional wall motion abnormalities. - Aortic valve: A bioprosthesis was present. Valve area (VTI): 2.06   cm^2. Valve area (Vmax): 1.86 cm^2. Valve area (Vmean): 2.14   cm^2. - Mitral valve: There was mild regurgitation.   Left heart cath 11/06/17: 1. Severe 2 vessel CAD with severe stenosis of a large, dominant RCA and severe stenosis of a small OM branch 2. Severe aortic stenosis with mean gradient 54 mmHg and calculated AVA 0.83  Recommend: DAPT with ASA and clopidogrel x 6 months. Continue multidisciplinary evaluation for TAVR Medical Rx for residual CAD   TAVR 12/05/17  Post op echo 12/06/17: Study Conclusions - Procedure narrative: Transthoracic echocardiography. Image quality was poor. - Left ventricle: The cavity size was normal. Wall thickness was increased in a pattern of moderate LVH. Systolic function was vigorous. The estimated ejection fraction was in the range of 65% to 70%. - Aortic valve: s/p Edwards  Sapien 3 THV (26 mm) - no obvious paravalvular leak or obstruction. Mean gradient (S): 7 mm Hg. Peak gradient (S): 14 mm Hg. Valve area (VTI): 1.89 cm^2. Valve area (Vmax): 1.54 cm^2. Valve area (Vmean): 1.65 cm^2. - Pericardium, extracardiac: There was no pericardial effusion. Impressions: - Technically difficult study. Compared to a prior study on 12/05/2017, there has been interval placement of an Edwards Sapien 3 THV (26 mm) without paravalvular leak or obstruction.    History of Present Illness     82 y/o male with PMH including CAD status post recent RCA stenting, aortic stenosis status post recent TAVR with postprocedure left bundle branch block and first-degree AV block, hypertension, hyperlipidemia, hypothyroidism, prior strokes, and rheumatoid arthritis.  Patient was evaluated for severe aortic stenosis after echo in February showed such.  He was referred to Dr. Burt Knack and seen in April in the setting of progressive functional decline.   he subsequently underwent diagnostic catheterization in May which showed severe obtuse marginal and RCA disease.  The obtuse marginal small while the RCA was felt to be amenable to PCI and he subsequently underwent drug-eluting stent placement to the RCA.  He tolerated the procedure well and was readmitted on June 4 and underwent successful TAVR via a right femoral approach.  Postprocedure echo showed stable prosthesis with no perivalvular leak.  Postprocedure course was complicated by a new left bundle branch block and first-degree AV block.  He did initially have some bradycardia and hypotension which subsequently resolved.  He never required pacing on his home dose of beta-blocker was able to be  resumed at discharge.  He also had some nausea and vomiting on the morning of June 6 but this subsequently resolved.  He was discharged home on June 7.  He says that he was feeling well and was ambulating in his home with his walker without any symptoms or  significant limitations.  Beginning on June 12 however he began to note generalized malaise and weakness, stating that he just had no get up and go.  This was worse this morning, prompting him to hit his medical alert button.  When EMTs came out to his house they noted left bundle branch block and became concerned.  As result, he was transferred to the emergency department [facility] at [facility].  Here, he is in sinus rhythm without any significant bradycardia or pauses.  Chest x-ray shows no active cardiopulmonary disease.  H&H, and electrolytes are stable.  Creatinine is mildly elevated above baseline at 1.42.  Troponin is normal x2.  Urinalysis negative.  He denies chest pain, dyspnea, palpitations, PND, orthopnea, dizziness, syncope, edema, or early satiety.  He has had some erythema over his right thigh since the procedure.  Hospital Course     Consultants: none  Weakness and HTN Pt was admitted to cardiology. Blood cultures were sent from the ER. WBC was 12 and trended to 14. However, CXR and UA negative for infection. WBC could be coming from the rash on his leg. He is otherwise well-appearing and afebrile. Follow this outpatient and follow up on blood cultures.   Lasix was held this admission with the suspicion that he was a bit dry following TAVR procedure and this may have contributed to his feelings of weakness. Lasix were D/C'ed and HCTZ added for better pressure control. He was ambulated in the halls without problems, on room air, and feels well. Pt wishes to discharge home.   Pt's family and caregivers were asked to keep a BP log and bring to his appt with the TAVR team next week. All other medications were continued.    AS s/p TAVR Echo this admission with good valve function.   CAD s/p DES to RCA 11/2017 Troponins were negative. Denied chest pain.   Rash on leg May represent atheroembolization. Good distal pulses. May be contributing to his leukocytosis.   _____________  Discharge  Vitals Blood pressure (!) 149/54, pulse 72, temperature (!) 97.5 F (36.4 C), temperature source Oral, resp. rate 20, height 6' (1.829 m), weight 198 lb 3.1 oz (89.9 kg), SpO2 100 %.  Filed Weights   12/14/17 1125 12/14/17 2231 12/15/17 0523  Weight: 203 lb (92.1 kg) 199 lb (90.3 kg) 198 lb 3.1 oz (89.9 kg)    Labs & Radiologic Studies    CBC Recent Labs    12/14/17 1129 12/15/17 1125  WBC 12.8* 14.4*  HGB 12.0* 12.0*  HCT 36.1* 36.9*  MCV 92.6 94.4  PLT 132* 875*   Basic Metabolic Panel Recent Labs    12/14/17 2359 12/15/17 1125  NA 140 141  K 4.0 4.1  CL 105 107  CO2 26 26  GLUCOSE 347* 162*  BUN 26* 24*  CREATININE 1.32* 1.24  CALCIUM 8.8* 9.0   Liver Function Tests Recent Labs    12/14/17 1129  AST 23  ALT 19  ALKPHOS 70  BILITOT 1.2  PROT 6.3*  ALBUMIN 3.8   No results for input(s): LIPASE, AMYLASE in the last 72 hours. Cardiac Enzymes Recent Labs    12/14/17 1129 12/14/17 1502  TROPONINI <0.03 <0.03  BNP Invalid input(s): POCBNP D-Dimer No results for input(s): DDIMER in the last 72 hours. Hemoglobin A1C No results for input(s): HGBA1C in the last 72 hours. Fasting Lipid Panel No results for input(s): CHOL, HDL, LDLCALC, TRIG, CHOLHDL, LDLDIRECT in the last 72 hours. Thyroid Function Tests No results for input(s): TSH, T4TOTAL, T3FREE, THYROIDAB in the last 72 hours.  Invalid input(s): FREET3 _____________  Dg Chest 2 View  Result Date: 12/04/2017 CLINICAL DATA:  Preoperative evaluation for AVR EXAM: CHEST - 2 VIEW COMPARISON:  09/17/2015 FINDINGS: Enlargement of cardiac silhouette. Mediastinal contours and pulmonary vascularity normal. Minimal RIGHT basilar atelectasis. Lungs otherwise clear. No pulmonary infiltrate, pleural effusion or pneumothorax. Minimal atherosclerotic calcification aortic arch. Bones demineralized with evidence of a chronic RIGHT rotator cuff tear. IMPRESSION: Minimal RIGHT basilar atelectasis. Enlargement of cardiac  silhouette. Electronically Signed   By: Lavonia Dana M.D.   On: 12/04/2017 09:34   Ct Coronary Morph W/cta Cor W/score W/ca W/cm &/or Wo/cm  Addendum Date: 11/20/2017   ADDENDUM REPORT: 11/20/2017 17:09 CLINICAL DATA:  82 year old male with severe aortic stenosis being evaluated for a TAVR procedure. EXAM: Cardiac TAVR CT TECHNIQUE: The patient was scanned on a [employer]. A 120 kV retrospective scan was triggered in the descending thoracic aorta at 111 HU's. Gantry rotation speed was 250 msecs and collimation was .6 mm. No beta blockade or nitro were given. The 3D data set was reconstructed in 5% intervals of the R-R cycle. Systolic and diastolic phases were analyzed on a dedicated work station using MPR, MIP and VRT modes. The patient received 80 cc of contrast. FINDINGS: Aortic Valve: Trileaflet aortic valve with severely thickened and calcified leaflets and severely restricted leaflet openings. There are no calcifications extending into the LVOT. Aorta: Normal size, mild diffuse calcifications, no dissection. Sinotubular Junction: 26 x 25 mm Ascending Thoracic Aorta: 30 x 29 mm Aortic Arch: 23 x 21 mm Descending Thoracic Aorta: 24 x 22 mm Sinus of Valsalva Measurements: Non-coronary: 29 mm Right -coronary: 31 mm Left -coronary: 32 mm Coronary Artery Height above Annulus: Left Main: 12 mm Right Coronary: 20 mm Virtual Basal Annulus Measurements: Maximum/Minimum Diameter: 27.6 x 24.2 mm Mean Diameter: 25.2 mm Perimeter: 80.5 mm Area: 500 mm2 Optimum Fluoroscopic Angle for Delivery:  LAO 10 CAU 10 IMPRESSION: 1. Trileaflet aortic valve with severely thickened and calcified leaflets and severely restricted leaflet openings. Annular measurements suitable for delivery of a 26 mm Edwards-SAPIEN 3 valve. 2. Sufficient coronary to annulus distance. 3. Optimum Fluoroscopic Angle for Delivery: LAO 10 CAU 10 4. No thrombus in the left atrial appendage. Electronically Signed   By: Ena Dawley   On:  11/20/2017 17:09   Result Date: 11/20/2017 EXAM: OVER-READ INTERPRETATION  CT CHEST The following report is an over-read performed by radiologist Dr. Vinnie Langton of [facility], [place] on 11/20/2017. This over-read does not include interpretation of cardiac or coronary anatomy or pathology. The coronary calcium score/coronary CTA interpretation by the cardiologist is attached. COMPARISON:  Chest CT 03/28/2013. FINDINGS: Extracardiac findings will be described separately under dictation for contemporaneously obtained CTA chest, abdomen and pelvis dated 11/20/2017. IMPRESSION: Please see separate dictation for contemporaneously obtained CTA chest, abdomen and pelvis 11/20/2017 for full description of relevant extracardiac findings. Electronically Signed: By: Vinnie Langton M.D. On: 11/20/2017 16:56   Dg Chest Port 1 View  Result Date: 12/14/2017 CLINICAL DATA:  Encounter for weakness and recent tvar EXAM: PORTABLE CHEST 1 VIEW COMPARISON:  12/06/2017 FINDINGS: Normal mediastinum and cardiac  silhouette. Aortic valve replacement noted. Normal pulmonary vasculature. No evidence of effusion, infiltrate, or pneumothorax. No acute bony abnormality. IMPRESSION: No acute cardiopulmonary process. Electronically Signed   By: Suzy Bouchard M.D.   On: 12/14/2017 13:48   Dg Chest Port 1 View  Result Date: 12/06/2017 CLINICAL DATA:  Status post transcatheter aortic valve replacement. EXAM: PORTABLE CHEST 1 VIEW COMPARISON:  Radiograph of December 05, 2017. FINDINGS: Stable cardiomediastinal silhouette. Aortic valve prosthesis is unchanged in position. No pneumothorax or pleural effusion is noted. Minimal bibasilar subsegmental atelectasis is noted. Right internal jugular catheter is again noted, with tip directed into right subclavian vein which is unchanged compared to prior exam. Bony thorax is unremarkable. IMPRESSION: Minimal bibasilar subsegmental atelectasis. No change in position of right internal jugular  catheter, with tip directed into right subclavian vein. Electronically Signed   By: Marijo Conception, M.D.   On: 12/06/2017 09:20   Dg Chest Port 1 View  Result Date: 12/05/2017 CLINICAL DATA:  Post TAVR EXAM: PORTABLE CHEST 1 VIEW COMPARISON:  Portable exam 1615 hours compared to 12/04/2017 FINDINGS: RIGHT jugular central venous catheter deviates into the RIGHT subclavian vein with the tip projecting over the RIGHT axillary vein directed towards the RIGHT upper extremity. Interval TAVR. Enlargement of cardiac silhouette. Mediastinal contours and pulmonary vascularity normal. Lungs clear. No pleural effusion or pneumothorax. Bones demineralized with probable chronic RIGHT rotator cuff tear. IMPRESSION: Post TAVR. Tip of RIGHT jugular catheter is in the RIGHT axillary vein directed towards the RIGHT upper extremity. Findings called to Miguel Rota PA on 12/05/2017 at 1631 hours. Electronically Signed   By: Lavonia Dana M.D.   On: 12/05/2017 16:34   Ct Angio Chest Aorta W &/or Wo Contrast  Result Date: 11/21/2017 CLINICAL DATA:  82 year old male with history of severe aortic stenosis. Preprocedural study prior to potential transcatheter aortic valve replacement (TAVR) procedure. EXAM: CT ANGIOGRAPHY CHEST, ABDOMEN AND PELVIS TECHNIQUE: Multidetector CT imaging through the chest, abdomen and pelvis was performed using the standard protocol during bolus administration of intravenous contrast. Multiplanar reconstructed images and MIPs were obtained and reviewed to evaluate the vascular anatomy. CONTRAST:  52mL ISOVUE-370 IOPAMIDOL (ISOVUE-370) INJECTION 76% COMPARISON:  Chest CT 03/28/2013. CT the abdomen and pelvis 07/17/2014. FINDINGS: CTA CHEST FINDINGS Cardiovascular: Heart size is normal. There is no significant pericardial fluid, thickening or pericardial calcification. There is aortic atherosclerosis, as well as atherosclerosis of the great vessels of the mediastinum and the coronary arteries, including  calcified atherosclerotic plaque in the left main, left anterior descending and right coronary arteries. Severe calcifications and thickening of the aortic valve. Lipomatous hypertrophy of the interatrial septum (normal anatomical variant) is incidentally noted. Mediastinum/Lymph Nodes: Several prominent borderline enlarged mediastinal and hilar lymph nodes are noted measuring up to 1.4 cm in short axis in the right hilar nodal station. Esophagus is unremarkable in appearance. No axillary lymphadenopathy. Lungs/Pleura: Several tiny 2-3 mm pulmonary nodules are noted in the lungs bilaterally, nonspecific, but statistically likely benign. No larger more suspicious appearing pulmonary nodules or masses are noted. Patchy areas of ground-glass attenuation and mild interlobular septal thickening are noted, most evident throughout the mid to lower lungs, favored to reflect a background of mild interstitial pulmonary edema. No consolidative airspace disease. No pleural effusions. Musculoskeletal/Soft Tissues: There are no aggressive appearing lytic or blastic lesions noted in the visualized portions of the skeleton. CTA ABDOMEN AND PELVIS FINDINGS Hepatobiliary: In the central aspect of segment 7 of the liver (axial image 79 of series 14)  there is a 1.9 x 1.7 cm lesion which is centrally low-attenuation in demonstrates some peripheral nodular enhancement, stable compared to prior study from 2016, most compatible with a cavernous hemangioma. No other suspicious appearing cystic or solid hepatic lesions are noted. No intra or extrahepatic biliary ductal dilatation. Status post cholecystectomy. Pancreas: No pancreatic mass. No pancreatic ductal dilatation. No pancreatic or peripancreatic fluid or inflammatory changes. Spleen: Large splenule adjacent to the splenic hilum. Adrenals/Urinary Tract: Subcentimeter low-attenuation lesion in the lower pole of the right kidney his too small to characterize, but similar to prior study,  statistically likely a cyst. Mild atrophy of both kidneys. No hydroureteronephrosis. Urinary bladder is normal in appearance. Bilateral adrenal glands are normal in appearance. Stomach/Bowel: Normal appearance of the stomach. No pathologic dilatation of small bowel or colon. Numerous colonic diverticulae are noted, without surrounding inflammatory changes to suggest an acute diverticulitis at this time. Normal appendix. Vascular/Lymphatic: Extensive aortic atherosclerosis without evidence of aneurysm or dissection in the abdominal or pelvic vasculature. Vascular findings and measurements pertinent to potential TAVR procedure, as detailed below. Celiac axis, superior mesenteric artery and inferior mesenteric artery are all widely patent without hemodynamically significant stenosis. Mild stenosis of the dominant left renal artery. There is also a small accessory branch to the lower pole which appears patent. Single right renal artery is widely patent without hemodynamically significant stenosis. No lymphadenopathy noted in the abdomen or pelvis. Reproductive: Prostate gland and seminal vesicles are unremarkable in appearance. Other: No significant volume of ascites.  No pneumoperitoneum. Musculoskeletal: There are no aggressive appearing lytic or blastic lesions noted in the visualized portions of the skeleton. VASCULAR MEASUREMENTS PERTINENT TO TAVR: AORTA: Minimal Aortic Diameter-10 x 9 mm Severity of Aortic Calcification-severe RIGHT PELVIS: Right Common Iliac Artery - Minimal Diameter-9.5 x 6.8 mm Tortuosity-mild Calcification-moderate Right External Iliac Artery - Minimal Diameter-5.8 x 4.3 mm Tortuosity-severe Calcification-mild Right Common Femoral Artery - Minimal Diameter-6.7 x 5.1 mm Tortuosity-mild Calcification-moderate LEFT PELVIS: Left Common Iliac Artery - Minimal Diameter-7.3 x 6.5 mm Tortuosity-mild Calcification-moderate Left External Iliac Artery - Minimal Diameter-6.1 x 4.0 mm Tortuosity-moderate  Calcification-mild Left Common Femoral Artery - Minimal Diameter-5.9 x 5.8 mm Tortuosity-mild Calcification-moderate Review of the MIP images confirms the above findings. IMPRESSION: 1. Vascular findings and measurements pertinent to potential TAVR procedure, as detailed above. 2. Severe thickening calcification of the aortic valve, compatible with the reported clinical history of severe aortic stenosis. 3. Findings in the lungs concerning for mild interstitial pulmonary edema. 4. Multiple prominent borderline enlarged mediastinal and hilar lymph nodes. These are nonspecific, but in the setting of possible pulmonary edema are favored to be benign. Further clinical evaluation to exclude lymphoproliferative disorder should be considered. 5. Aortic atherosclerosis, in addition to left main and 3 vessel coronary artery disease. 6. Colonic diverticulosis without evidence of acute diverticulitis at this time. 7. Cavernous hemangioma in segment 7 of the liver again noted, as above. 8. Status post cholecystectomy. 9. Additional incidental findings, as above. Aortic Atherosclerosis (ICD10-I70.0). Electronically Signed   By: Vinnie Langton M.D.   On: 11/21/2017 08:57   Ct Angio Abdomen Pelvis  W &/or Wo Contrast  Result Date: 11/21/2017 CLINICAL DATA:  82 year old male with history of severe aortic stenosis. Preprocedural study prior to potential transcatheter aortic valve replacement (TAVR) procedure. EXAM: CT ANGIOGRAPHY CHEST, ABDOMEN AND PELVIS TECHNIQUE: Multidetector CT imaging through the chest, abdomen and pelvis was performed using the standard protocol during bolus administration of intravenous contrast. Multiplanar reconstructed images and MIPs were obtained and  reviewed to evaluate the vascular anatomy. CONTRAST:  9mL ISOVUE-370 IOPAMIDOL (ISOVUE-370) INJECTION 76% COMPARISON:  Chest CT 03/28/2013. CT the abdomen and pelvis 07/17/2014. FINDINGS: CTA CHEST FINDINGS Cardiovascular: Heart size is normal. There  is no significant pericardial fluid, thickening or pericardial calcification. There is aortic atherosclerosis, as well as atherosclerosis of the great vessels of the mediastinum and the coronary arteries, including calcified atherosclerotic plaque in the left main, left anterior descending and right coronary arteries. Severe calcifications and thickening of the aortic valve. Lipomatous hypertrophy of the interatrial septum (normal anatomical variant) is incidentally noted. Mediastinum/Lymph Nodes: Several prominent borderline enlarged mediastinal and hilar lymph nodes are noted measuring up to 1.4 cm in short axis in the right hilar nodal station. Esophagus is unremarkable in appearance. No axillary lymphadenopathy. Lungs/Pleura: Several tiny 2-3 mm pulmonary nodules are noted in the lungs bilaterally, nonspecific, but statistically likely benign. No larger more suspicious appearing pulmonary nodules or masses are noted. Patchy areas of ground-glass attenuation and mild interlobular septal thickening are noted, most evident throughout the mid to lower lungs, favored to reflect a background of mild interstitial pulmonary edema. No consolidative airspace disease. No pleural effusions. Musculoskeletal/Soft Tissues: There are no aggressive appearing lytic or blastic lesions noted in the visualized portions of the skeleton. CTA ABDOMEN AND PELVIS FINDINGS Hepatobiliary: In the central aspect of segment 7 of the liver (axial image 79 of series 14) there is a 1.9 x 1.7 cm lesion which is centrally low-attenuation in demonstrates some peripheral nodular enhancement, stable compared to prior study from 2016, most compatible with a cavernous hemangioma. No other suspicious appearing cystic or solid hepatic lesions are noted. No intra or extrahepatic biliary ductal dilatation. Status post cholecystectomy. Pancreas: No pancreatic mass. No pancreatic ductal dilatation. No pancreatic or peripancreatic fluid or inflammatory  changes. Spleen: Large splenule adjacent to the splenic hilum. Adrenals/Urinary Tract: Subcentimeter low-attenuation lesion in the lower pole of the right kidney his too small to characterize, but similar to prior study, statistically likely a cyst. Mild atrophy of both kidneys. No hydroureteronephrosis. Urinary bladder is normal in appearance. Bilateral adrenal glands are normal in appearance. Stomach/Bowel: Normal appearance of the stomach. No pathologic dilatation of small bowel or colon. Numerous colonic diverticulae are noted, without surrounding inflammatory changes to suggest an acute diverticulitis at this time. Normal appendix. Vascular/Lymphatic: Extensive aortic atherosclerosis without evidence of aneurysm or dissection in the abdominal or pelvic vasculature. Vascular findings and measurements pertinent to potential TAVR procedure, as detailed below. Celiac axis, superior mesenteric artery and inferior mesenteric artery are all widely patent without hemodynamically significant stenosis. Mild stenosis of the dominant left renal artery. There is also a small accessory branch to the lower pole which appears patent. Single right renal artery is widely patent without hemodynamically significant stenosis. No lymphadenopathy noted in the abdomen or pelvis. Reproductive: Prostate gland and seminal vesicles are unremarkable in appearance. Other: No significant volume of ascites.  No pneumoperitoneum. Musculoskeletal: There are no aggressive appearing lytic or blastic lesions noted in the visualized portions of the skeleton. VASCULAR MEASUREMENTS PERTINENT TO TAVR: AORTA: Minimal Aortic Diameter-10 x 9 mm Severity of Aortic Calcification-severe RIGHT PELVIS: Right Common Iliac Artery - Minimal Diameter-9.5 x 6.8 mm Tortuosity-mild Calcification-moderate Right External Iliac Artery - Minimal Diameter-5.8 x 4.3 mm Tortuosity-severe Calcification-mild Right Common Femoral Artery - Minimal Diameter-6.7 x 5.1 mm  Tortuosity-mild Calcification-moderate LEFT PELVIS: Left Common Iliac Artery - Minimal Diameter-7.3 x 6.5 mm Tortuosity-mild Calcification-moderate Left External Iliac Artery - Minimal Diameter-6.1 x 4.0 mm  Tortuosity-moderate Calcification-mild Left Common Femoral Artery - Minimal Diameter-5.9 x 5.8 mm Tortuosity-mild Calcification-moderate Review of the MIP images confirms the above findings. IMPRESSION: 1. Vascular findings and measurements pertinent to potential TAVR procedure, as detailed above. 2. Severe thickening calcification of the aortic valve, compatible with the reported clinical history of severe aortic stenosis. 3. Findings in the lungs concerning for mild interstitial pulmonary edema. 4. Multiple prominent borderline enlarged mediastinal and hilar lymph nodes. These are nonspecific, but in the setting of possible pulmonary edema are favored to be benign. Further clinical evaluation to exclude lymphoproliferative disorder should be considered. 5. Aortic atherosclerosis, in addition to left main and 3 vessel coronary artery disease. 6. Colonic diverticulosis without evidence of acute diverticulitis at this time. 7. Cavernous hemangioma in segment 7 of the liver again noted, as above. 8. Status post cholecystectomy. 9. Additional incidental findings, as above. Aortic Atherosclerosis (ICD10-I70.0). Electronically Signed   By: Vinnie Langton M.D.   On: 11/21/2017 08:57   Disposition   Pt is being discharged home today in good condition.  Follow-up Plans & Appointments     Discharge Instructions    Diet - low sodium heart healthy   Complete by:  As directed    Discharge instructions   Complete by:  As directed    Start taking blood pressure once per day and keeping a log. Call the office if systolic pressure is consistently above 160. Keep your appt next week with TAVR clinic.   Increase activity slowly   Complete by:  As directed       Discharge Medications   Allergies as of  12/15/2017      Reactions   Acetaminophen Other (See Comments)   Long period of time, effects kidney      Medication List    STOP taking these medications   furosemide 40 MG tablet Commonly known as:  LASIX     TAKE these medications   aspirin EC 81 MG tablet Take 81 mg by mouth daily.   atorvastatin 20 MG tablet Commonly known as:  LIPITOR Take 20 mg by mouth at bedtime.   brimonidine 0.2 % ophthalmic solution Commonly known as:  ALPHAGAN Place 1 drop into both eyes 2 (two) times daily.   busPIRone 15 MG tablet Commonly known as:  BUSPAR Take 15 mg by mouth 2 (two) times daily.   clopidogrel 75 MG tablet Commonly known as:  PLAVIX Take 75 mg by mouth at bedtime.   dorzolamide-timolol 22.3-6.8 MG/ML ophthalmic solution Commonly known as:  COSOPT Place 1 drop into both eyes 2 (two) times daily.   finasteride 5 MG tablet Commonly known as:  PROSCAR Take 1 tablet (5 mg total) by mouth daily.   glipiZIDE 5 MG tablet Commonly known as:  GLUCOTROL Take 5 mg by mouth 2 (two) times daily before a meal.   hydrochlorothiazide 25 MG tablet Commonly known as:  HYDRODIURIL Take 0.5 tablets (12.5 mg total) by mouth daily.   latanoprost 0.005 % ophthalmic solution Commonly known as:  XALATAN Place 1 drop into both eyes at bedtime.   levothyroxine 75 MCG tablet Commonly known as:  SYNTHROID, LEVOTHROID Take 75 mcg by mouth daily before breakfast.   losartan 50 MG tablet Commonly known as:  COZAAR Take 50 mg by mouth 2 (two) times daily.   Melatonin 5 MG Tabs Take 5 mg by mouth at bedtime.   metFORMIN 1000 MG tablet Commonly known as:  GLUCOPHAGE Take 1,000 mg by mouth 2 (two) times daily  with a meal.   nitroGLYCERIN 0.4 MG SL tablet Commonly known as:  NITROSTAT Place 1 tablet (0.4 mg total) under the tongue every 5 (five) minutes as needed. What changed:  reasons to take this   pantoprazole 40 MG tablet Commonly known as:  PROTONIX Take 1 tablet (40 mg  total) by mouth daily.   PRESERVISION AREDS 2 Caps Take 1 capsule by mouth 2 (two) times daily.   tamsulosin 0.4 MG Caps capsule Commonly known as:  FLOMAX Take 1 capsule (0.4 mg total) by mouth daily after supper.   vitamin B-12 1000 MCG tablet Commonly known as:  CYANOCOBALAMIN Take 1,000 mcg by mouth at bedtime.   Vitamin D3 2000 units capsule Take 2,000 Units by mouth 2 (two) times daily.        Acute coronary syndrome (MI, NSTEMI, STEMI, etc) this admission?: No.    Outstanding Labs/Studies   Follow up on blood cultures, leukocytosis  Duration of Discharge Encounter   Greater than 30 minutes including physician time.  Signed, [place], [place] 12/15/2017, 3:04 PM

## 2017-12-18 ENCOUNTER — Other Ambulatory Visit: Payer: Self-pay

## 2017-12-18 NOTE — Patient Outreach (Signed)
[facility]) Care Management  [facility] Care Manager  12/18/2017   MAXSON ODDO 12-02-1935 [id number]   82 year old male referred to [facility] services for 30 day post discharge medication review.  PMHx includes, but not limited to, severe aortic stenosis, hypertension, Type 2 diabetes mellitus, hypothyroidism, rheumatoid arthritis, BPH, h/o CVA, and hyperlipidemia.  Successful outreach placed to Mr. Groene's phone, which was answered by his daughter [name] who is listed a contact person.  HIPAA identifiers verified.   Subjective:  Mr. Reiland daughter, [name] states that he is doing well.  She reports his blood pressures since his last admission as 132/61, 136/60 and128/64.  She said that his blood pressure was high when he went to the hospital last because they made him wait in the waiting room for hours.   She is aware that she needs to bring his blood pressure log to his next doctor's appointment.  She states that she is with her dad on Monday and that he has a caregiver Tuesday-Friday.  [name] reports that his caregiver prepares his pill box on Wednesday and she states he has no affordability issues with his prescriptions.  Objective:  SCr1.24mg /dL on 12/15/17 HgA1c 5.9% on 12/04/17  Encounter Medications:  Outpatient Encounter Medications as of 12/18/2017  Medication Sig  . aspirin EC 81 MG tablet Take 81 mg by mouth daily.  Marland Kitchen atorvastatin (LIPITOR) 20 MG tablet Take 20 mg by mouth at bedtime.   . brimonidine (ALPHAGAN) 0.2 % ophthalmic solution Place 1 drop into both eyes 2 (two) times daily.   . busPIRone (BUSPAR) 15 MG tablet Take 15 mg by mouth 2 (two) times daily.   . Cholecalciferol (VITAMIN D3) 2000 units capsule Take 2,000 Units by mouth 2 (two) times daily.   . clopidogrel (PLAVIX) 75 MG tablet Take 75 mg by mouth at bedtime.   . dorzolamide-timolol (COSOPT) 22.3-6.8 MG/ML ophthalmic solution Place 1 drop into both eyes 2 (two) times daily.  . finasteride (PROSCAR) 5  MG tablet Take 1 tablet (5 mg total) by mouth daily.  Marland Kitchen glipiZIDE (GLUCOTROL) 5 MG tablet Take 5 mg by mouth 2 (two) times daily before a meal.   . hydrochlorothiazide (HYDRODIURIL) 25 MG tablet Take 0.5 tablets (12.5 mg total) by mouth daily.  Marland Kitchen latanoprost (XALATAN) 0.005 % ophthalmic solution Place 1 drop into both eyes at bedtime.   Marland Kitchen levothyroxine (SYNTHROID, LEVOTHROID) 75 MCG tablet Take 75 mcg by mouth daily before breakfast.  . losartan (COZAAR) 50 MG tablet Take 50 mg by mouth 2 (two) times daily.   . Melatonin 5 MG TABS Take 5 mg by mouth at bedtime.   . metFORMIN (GLUCOPHAGE) 1000 MG tablet Take 1,000 mg by mouth 2 (two) times daily with a meal.  . Multiple Vitamins-Minerals (PRESERVISION AREDS 2) CAPS Take 1 capsule by mouth 2 (two) times daily.  . nitroGLYCERIN (NITROSTAT) 0.4 MG SL tablet Place 1 tablet (0.4 mg total) under the tongue every 5 (five) minutes as needed. (Patient taking differently: Place 0.4 mg under the tongue every 5 (five) minutes as needed for chest pain. )  . pantoprazole (PROTONIX) 40 MG tablet Take 1 tablet (40 mg total) by mouth daily.  . tamsulosin (FLOMAX) 0.4 MG CAPS capsule Take 1 capsule (0.4 mg total) by mouth daily after supper.  . vitamin B-12 (CYANOCOBALAMIN) 1000 MCG tablet Take 1,000 mcg by mouth at bedtime.   No facility-administered encounter medications on file as of 12/18/2017.     Functional Status:  In your present state of health, do you have any difficulty performing the following activities: 12/14/2017 12/06/2017  Hearing? [name]  Vision? Y Y  Difficulty concentrating or making decisions? N N  Walking or climbing stairs? Y Y  Dressing or bathing? N N  Doing errands, shopping? Y N  Some recent data might be hidden    Fall/Depression Screening: Fall Risk  01/01/2016 12/01/2015 10/15/2015  Falls in the past year? Yes Yes Yes  Number falls in past yr: 1 1 1   Injury with Fall? No No No  Risk Factor Category  - - -  Risk for fall due to :  Impaired vision Impaired vision;Impaired balance/gait;History of fall(s) Impaired vision;Impaired balance/gait;History of fall(s)  Follow up Falls prevention discussed Falls prevention discussed Falls prevention discussed   PHQ 2/9 Scores 01/01/2016 12/01/2015 10/08/2015  PHQ - 2 Score 0 0 1   ASSESSMENT: Date Discharged from Hospital: 12/14/17 Date Medication Reconciliation Performed: 12/18/2017  Medications Discontinued at Discharge:  furosemide  New Medications at Discharge:  hydrochlothiazide  Patient was recently discharged from hospital and all medications have been reviewed  Drugs sorted by system:  Neurologic/Psychologic: buspirone  Cardiovascular: aspirin, atorvastatin, clopidogrel, hydrochlorothiazide, losartan, nitroglycerin   Gastrointestinal: pantoprazole  Endocrine: glipizide, levothyroxine, metformin  Topical: brimonidine ophthalmic, dorzolamine/timolol ophthalmic, latanoprost ophthalmic  Vitamins/Minerals: cholecalciferol, MVI/minerals, vitamin B-12  Miscellaneous: finasteride, melatonin, tamsulosin  Medications to avoid in the elderly:  Per the [place] List, pantoproazole may increase risk of C. difficile bone loss & fractures.  Avoid scheduled use for >8 weeks unless high risk (oral corticosteroids, chronic NSAIDs, erosive esophagitis, hypersecretory conditions or demonstrated need for maintenance.)  Risk of hypoglycemia with sulfonylureas.  However, short acting glipizide is preferred over longer acting agents.  Medication classes with low risk of hypoglycemia are preferred over sulfonylureas.   Informed patient's daughter, [name] that I will close his [place] case as Mr. Cowgill has no medications issues at this time.  She is aware that she can call me in the future should needs arise.   PLAN: Route PCP, Dr. Doy Hutching my note.   Close [place] case.    Joetta Manners, PharmD Clinical Pharmacist [place] [phone]

## 2017-12-19 DIAGNOSIS — M65332 Trigger finger, left middle finger: Secondary | ICD-10-CM | POA: Diagnosis not present

## 2017-12-19 DIAGNOSIS — M79645 Pain in left finger(s): Secondary | ICD-10-CM | POA: Diagnosis not present

## 2017-12-19 NOTE — Progress Notes (Signed)
[facility] [place]                                       Cardiology Office Note    Date:  12/20/2017   ID:  Chad Rollins, DOB 12-29-1935, MRN [id number]  PCP:  Idelle Crouch, MD  Cardiologist: Dr. Ubaldo Glassing Dr. Burt Knack & Dr. Cyndia Bent (TAVR)  CC: [facility] s/p TAVR   History of Present Illness:  Chad Rollins is a 81 y.o. male with a history of rheumatoid arthritis, HTN, DMT2, CAD s/p DES to RCA on 11/06/2017, peripheral neuropathy, prior stroke with significant visual impairment and severe aortic stenosis s/p TAVR (12/05/17) who presents to clinic for post hospital follow up.   He has had a heart murmur for many years and has been followed for aortic stenosis with serial exams and echo studies. He recently had a CXR demonstrating interstitial pulmonary edema and there was concern that aortic stenosis is now precipitating diastolic heart failure. He was referred back to Dr Ubaldo Glassing who performed an echo demonstrating worsening aortic stenosis with a mean transaortic gradient of 52 mmHg. He was referred with Dr. Burt Knack with the multidisciplinary valve team. [facility] 11/06/17 showed severe 2V CAD with severe stenosis of a large, dominant RCA and severe stenosis of a small OM branch as well as severe AS with a mean gradient of 54 mm Hg and AVA 0.83. He underwent successful PCI/DES x1 to the [facility] and placed on ASA and plavix.   He underwent successful TAVR with a 26 mm EdwardsSapien THVvia the TF approach on 12/05/2017.Postoperative echoshowed normally functioningaortic valve prosthesis with noPVL, mean gradient 7 mm Hg. He had a new LBBB and transient bradycardia postoperatively and home metoprolol was discontinued. He was discharged on ASA and Plavix.   He was readmitted from 6/13-6/14/19 for generalized weakness. A rash was noted on his leg (? Secondary to atheroemboli) and white count mildly elevated. His lasix was switched to HCTZ.  Today he presents to clinic for  follow up. He is doing better.Still having pain in left leg and rash still present. Otherwise, he has been feeling much better.Weakness has improved. No CP or SOB. No LE edema, orthopnea or PND. No dizziness or syncope. No blood in stool or urine. No palpitations.    Past Medical History:  Diagnosis Date  . BPH with obstruction/lower urinary tract symptoms   . CAD (coronary artery disease)    a. 11/2017 PCI: LM nl, LAD mild diff dzs, LCX, mild diff dzs, OM1 small, 99ost, RCA 90p/m (3.5x18 Synergy DES).  . Continuous right lower quadrant pain   . Diabetes mellitus ([place])   . HLD (hyperlipidemia)   . HTN (hypertension)   . Hypothyroidism   . Injury of right rotator cuff   . LBBB (left bundle branch block)    a. developed post-TAVR 12/2017.  . Obesity   . Osteoarthritis   . Peripheral neuropathy   . Rheumatoid arthritis ([place])   . Severe aortic stenosis    a. 12/2017 s/p TAVR w/ Edwards Sapien 3 (size 65mm, model #9600TFX, [id number]); b. 12/2017 Echo: nl fxning AoV w/ no PVL. Mean gradient 23mmHg.  . Stroke [facility])     Past Surgical History:  Procedure Laterality Date  . CHOLECYSTECTOMY    . CORONARY STENT INTERVENTION N/A 11/06/2017   Procedure: CORONARY STENT INTERVENTION;  Surgeon: Sherren Mocha, MD;  Location: [facility];  Service: Cardiovascular;  Laterality: N/A;  . INTRAOPERATIVE TRANSTHORACIC ECHOCARDIOGRAM N/A 12/05/2017   Procedure: INTRAOPERATIVE TRANSTHORACIC ECHOCARDIOGRAM;  Surgeon: Sherren Mocha, MD;  Location: [facility];  Service: Open Heart Surgery;  Laterality: N/A;  . Lipoma excisions    . RIGHT/LEFT HEART CATH AND CORONARY ANGIOGRAPHY N/A 11/06/2017   Procedure: RIGHT/LEFT HEART CATH AND CORONARY ANGIOGRAPHY;  Surgeon: Sherren Mocha, MD;  Location: [facility];  Service: Cardiovascular;  Laterality: N/A;  . Surgery for sleep apnea    . TONSILLECTOMY    . TRANSCATHETER AORTIC VALVE REPLACEMENT, TRANSFEMORAL N/A 12/05/2017   Procedure: TRANSCATHETER AORTIC VALVE  REPLACEMENT, TRANSFEMORAL;  Surgeon: Sherren Mocha, MD;  Location: [facility];  Service: Open Heart Surgery;  Laterality: N/A;    Current Medications: Outpatient Medications Prior to Visit  Medication Sig Dispense Refill  . aspirin EC 81 MG tablet Take 81 mg by mouth daily.    Marland Kitchen atorvastatin (LIPITOR) 20 MG tablet Take 20 mg by mouth at bedtime.     . brimonidine (ALPHAGAN) 0.2 % ophthalmic solution Place 1 drop into both eyes 2 (two) times daily.     . busPIRone (BUSPAR) 15 MG tablet Take 15 mg by mouth 2 (two) times daily.     . Cholecalciferol (VITAMIN D3) 2000 units capsule Take 2,000 Units by mouth 2 (two) times daily.     . clopidogrel (PLAVIX) 75 MG tablet Take 75 mg by mouth at bedtime.     . dorzolamide-timolol (COSOPT) 22.3-6.8 MG/ML ophthalmic solution Place 1 drop into both eyes 2 (two) times daily.    . finasteride (PROSCAR) 5 MG tablet Take 1 tablet (5 mg total) by mouth daily. 30 tablet 12  . glipiZIDE (GLUCOTROL) 5 MG tablet Take 5 mg by mouth 2 (two) times daily before a meal.     . hydrochlorothiazide (HYDRODIURIL) 25 MG tablet Take 0.5 tablets (12.5 mg total) by mouth daily. 30 tablet 6  . latanoprost (XALATAN) 0.005 % ophthalmic solution Place 1 drop into both eyes at bedtime.     Marland Kitchen levothyroxine (SYNTHROID, LEVOTHROID) 75 MCG tablet Take 75 mcg by mouth daily before breakfast.    . losartan (COZAAR) 50 MG tablet Take 50 mg by mouth 2 (two) times daily.     . Melatonin 5 MG TABS Take 5 mg by mouth at bedtime.     . metFORMIN (GLUCOPHAGE) 1000 MG tablet Take 1,000 mg by mouth 2 (two) times daily with a meal.    . Multiple Vitamins-Minerals (PRESERVISION AREDS 2) CAPS Take 1 capsule by mouth 2 (two) times daily.    . nitroGLYCERIN (NITROSTAT) 0.4 MG SL tablet Place 1 tablet (0.4 mg total) under the tongue every 5 (five) minutes as needed. (Patient taking differently: Place 0.4 mg under the tongue every 5 (five) minutes as needed for chest pain. ) 25 tablet 2  . pantoprazole  (PROTONIX) 40 MG tablet Take 1 tablet (40 mg total) by mouth daily. 30 tablet 2  . tamsulosin (FLOMAX) 0.4 MG CAPS capsule Take 1 capsule (0.4 mg total) by mouth daily after supper. 30 capsule 12  . vitamin B-12 (CYANOCOBALAMIN) 1000 MCG tablet Take 1,000 mcg by mouth at bedtime.     No facility-administered medications prior to visit.      Allergies:   Acetaminophen   Social History   Socioeconomic History  . Marital status: Widowed    Spouse name: Not on file  . Number of children: Not on file  . Years  of education: Not on file  . Highest education level: Not on file  Occupational History  . Occupation: retired  Scientific laboratory technician  . Financial resource strain: Not on file  . Food insecurity:    Worry: Not on file    Inability: Not on file  . Transportation needs:    Medical: Not on file    Non-medical: Not on file  Tobacco Use  . Smoking status: Former Smoker    Last attempt to quit: 10/14/1973    Years since quitting: 44.2  . Smokeless tobacco: Former Systems developer  . Tobacco comment: quit 1975  Substance and Sexual Activity  . Alcohol use: No    Alcohol/week: 0.0 oz  . Drug use: No  . Sexual activity: Not on file  Lifestyle  . Physical activity:    Days per week: Not on file    Minutes per session: Not on file  . Stress: Not on file  Relationships  . Social connections:    Talks on phone: Not on file    Gets together: Not on file    Attends religious service: Not on file    Active member of club or organization: Not on file    Attends meetings of clubs or organizations: Not on file    Relationship status: Not on file  Other Topics Concern  . Not on file  Social History Narrative  . Not on file     Family History:  The patient's family history includes Diabetes in his mother; Hypertension in his mother.      ROS:   Please see the history of present illness.    ROS All other systems reviewed and are negative.   PHYSICAL EXAM:   VS:  BP 140/76   Pulse 64   Ht 6'  (1.829 m)   Wt 207 lb 6.4 oz (94.1 kg)   BMI 28.13 kg/m    GEN: Well nourished, well developed, in no acute distress, chronically ill appearing  HEENT: normal  Neck: no JVD, carotid bruits, or masses Cardiac: RRR; no murmurs, rubs, or gallops,no edema  Respiratory:  clear to auscultation bilaterally, normal work of breathing GI: soft, nontender, nondistended, + BS MS: no deformity or atrophy. Pain under right knee on back of leg Skin: warm and dry. Groin sites with mild hematoma but soft. Diffuse erythematous rash on right leg below the knee and on foot.  Neuro:  Alert and Oriented x 3, Strength and sensation are intact Psych: euthymic mood, full affect   Wt Readings from Last 3 Encounters:  12/20/17 207 lb 6.4 oz (94.1 kg)  12/15/17 198 lb 3.1 oz (89.9 kg)  12/07/17 203 lb 7.8 oz (92.3 kg)      Studies/Labs Reviewed:   EKG:  EKG is ordered today.  The ekg ordered today demonstrates NSR,  HR 64 with 1st deg AV block, LBBB  Recent Labs: 12/04/2017: B Natriuretic Peptide 149.8 12/06/2017: Magnesium 1.6 12/14/2017: ALT 19 12/15/2017: BUN 24; Creatinine, Ser 1.24; Hemoglobin 12.0; Platelets 145; Potassium 4.1; Sodium 141   Lipid Panel    Component Value Date/Time   CHOL 99 09/18/2015 0444   CHOL 148 08/08/2014 0522   TRIG 128 09/18/2015 0444   TRIG 195 08/08/2014 0522   HDL 28 (L) 09/18/2015 0444   HDL 28 (L) 08/08/2014 0522   CHOLHDL 3.5 09/18/2015 0444   VLDL 26 09/18/2015 0444   VLDL 39 08/08/2014 0522   LDLCALC 45 09/18/2015 0444   LDLCALC 81 08/08/2014 0522  Additional studies/ records that were reviewed today include:  TAVR OPERATIVE NOTE   Date of Procedure:12/05/2017  Preoperative Diagnosis:Severe Aortic Stenosis  Procedure:   Transcatheter Aortic Valve Replacement - Percutaneous Transfemoral Approach Edwards Sapien 3 THV (size 25mm, model # 9600TFX, serial # [id number])  Co-Surgeons:Bryan  Alveria Apley, MD and Sherren Mocha, MD   Pre-operative Echo Findings: severe aortic stenosis ? normalleft ventricular systolic function  Post-operative Echo Findings: ? noparavalvular leak ? normalleft ventricular systolic function  ________________  Post operative echo 12/06/17 Study Conclusions - Procedure narrative: Transthoracic echocardiography. Image quality was poor. - Left ventricle: The cavity size was normal. Wall thickness was increased in a pattern of moderate LVH. Systolic function was vigorous. The estimated ejection fraction was in the range of 65% to 70%. - Aortic valve: s/p Edwards Sapien 3 THV (26 mm) - no obvious paravalvular leak or obstruction. Mean gradient (S): 7 mm Hg. Peak gradient (S): 14 mm Hg. Valve area (VTI): 1.89 cm^2. Valve area (Vmax): 1.54 cm^2. Valve area (Vmean): 1.65 cm^2. - Pericardium, extracardiac: There was no pericardial effusion. Impressions: - Technically difficult study. Compared to a prior study on 12/05/2017, there has been interval placement of an Edwards Sapien 3 THV (26 mm) without paravalvular leak or obstruction.   ASSESSMENT & PLAN:   Severe AS s/p TAVR: doing better. Groin sites stable. Having pain in back of right leg behind knee that burns. Also has a diffuse erythematous rash on dorsum of right leg. Will get arterial doppler to rule out ischemia. ? Atheroemboli.   Bradycardia:resolved. HR 64 with 1st deg AV block, LBBB. With recent admission for weakness, I will not restart metoprolol   HTN: BP with borderline controlled today.  CAD: s/p recent DES to RCA 11/06/17. 6 months of DAPT recommended. Continue aspirin and Plavix, statin.   DMT2: continue home regimen.    Medication Adjustments/Labs and Tests Ordered: Current medicines are reviewed at length with the patient today.  Concerns regarding medicines are outlined above.  Medication changes, Labs and Tests ordered today are  listed in the Patient Instructions below. Patient Instructions  Medication Instructions:  Your physician recommends that you continue on your current medications as directed. Please refer to the Current Medication list given to you today.   Labwork: none  Testing/Procedures: Your physician has requested that you have a lower or upper extremity arterial duplex. This test is an ultrasound of the arteries in the legs or arms. It looks at arterial blood flow in the legs and arms. Allow one hour for Lower and Upper Arterial scans. There are no restrictions or special instructions  To be done in [place] on June 27,2019  Follow-Up: Follow up with echocardiogram and appointment with K. Grandville Silos, [place] on July 22,2019 as planned.   Any Other Special Instructions Will Be Listed Below (If Applicable).     If you need a refill on your cardiac medications before your next appointment, please call your pharmacy.      Signed, Angelena Form, PA-C  12/20/2017 9:03 PM    [facility] [address], [place], [place]  [address] Phone: [phone]; Fax: [phone]

## 2017-12-20 ENCOUNTER — Ambulatory Visit (INDEPENDENT_AMBULATORY_CARE_PROVIDER_SITE_OTHER): Payer: PPO | Admitting: Physician Assistant

## 2017-12-20 ENCOUNTER — Encounter (INDEPENDENT_AMBULATORY_CARE_PROVIDER_SITE_OTHER): Payer: Self-pay

## 2017-12-20 ENCOUNTER — Encounter: Payer: Self-pay | Admitting: Physician Assistant

## 2017-12-20 VITALS — BP 140/76 | HR 64 | Ht 72.0 in | Wt 207.4 lb

## 2017-12-20 DIAGNOSIS — M79604 Pain in right leg: Secondary | ICD-10-CM

## 2017-12-20 DIAGNOSIS — E118 Type 2 diabetes mellitus with unspecified complications: Secondary | ICD-10-CM

## 2017-12-20 DIAGNOSIS — I10 Essential (primary) hypertension: Secondary | ICD-10-CM | POA: Diagnosis not present

## 2017-12-20 DIAGNOSIS — I2510 Atherosclerotic heart disease of native coronary artery without angina pectoris: Secondary | ICD-10-CM | POA: Diagnosis not present

## 2017-12-20 DIAGNOSIS — R001 Bradycardia, unspecified: Secondary | ICD-10-CM

## 2017-12-20 DIAGNOSIS — Z952 Presence of prosthetic heart valve: Secondary | ICD-10-CM | POA: Diagnosis not present

## 2017-12-20 LAB — CULTURE, BLOOD (ROUTINE X 2)
CULTURE: NO GROWTH
Culture: NO GROWTH
Special Requests: ADEQUATE
Special Requests: ADEQUATE

## 2017-12-20 NOTE — Patient Instructions (Signed)
Medication Instructions:  Your physician recommends that you continue on your current medications as directed. Please refer to the Current Medication list given to you today.   Labwork: none  Testing/Procedures: Your physician has requested that you have a lower or upper extremity arterial duplex. This test is an ultrasound of the arteries in the legs or arms. It looks at arterial blood flow in the legs and arms. Allow one hour for Lower and Upper Arterial scans. There are no restrictions or special instructions  To be done in [place] on June 27,2019  Follow-Up: Follow up with echocardiogram and appointment with K. Grandville Silos, [place] on July 22,2019 as planned.   Any Other Special Instructions Will Be Listed Below (If Applicable).     If you need a refill on your cardiac medications before your next appointment, please call your pharmacy.

## 2017-12-22 ENCOUNTER — Other Ambulatory Visit: Payer: Self-pay | Admitting: Physician Assistant

## 2017-12-22 DIAGNOSIS — M79604 Pain in right leg: Secondary | ICD-10-CM

## 2017-12-27 DIAGNOSIS — H401133 Primary open-angle glaucoma, bilateral, severe stage: Secondary | ICD-10-CM | POA: Diagnosis not present

## 2017-12-28 ENCOUNTER — Ambulatory Visit (INDEPENDENT_AMBULATORY_CARE_PROVIDER_SITE_OTHER): Payer: PPO

## 2017-12-28 DIAGNOSIS — M79604 Pain in right leg: Secondary | ICD-10-CM | POA: Diagnosis not present

## 2018-01-10 ENCOUNTER — Ambulatory Visit: Payer: PPO | Admitting: Internal Medicine

## 2018-01-10 ENCOUNTER — Other Ambulatory Visit (HOSPITAL_COMMUNITY): Payer: PPO

## 2018-01-10 ENCOUNTER — Ambulatory Visit: Payer: PPO | Admitting: Physician Assistant

## 2018-01-19 DIAGNOSIS — E039 Hypothyroidism, unspecified: Secondary | ICD-10-CM | POA: Diagnosis not present

## 2018-01-19 DIAGNOSIS — Z79899 Other long term (current) drug therapy: Secondary | ICD-10-CM | POA: Diagnosis not present

## 2018-01-19 DIAGNOSIS — I10 Essential (primary) hypertension: Secondary | ICD-10-CM | POA: Diagnosis not present

## 2018-01-19 DIAGNOSIS — E782 Mixed hyperlipidemia: Secondary | ICD-10-CM | POA: Diagnosis not present

## 2018-01-19 DIAGNOSIS — E1142 Type 2 diabetes mellitus with diabetic polyneuropathy: Secondary | ICD-10-CM | POA: Diagnosis not present

## 2018-01-19 NOTE — Progress Notes (Signed)
[facility]                                       Cardiology Office Note    Date:  01/22/2018   ID:  Chad Rollins, DOB 03-12-1936, MRN [id number]  PCP:  Idelle Crouch, MD  Cardiologist:  Dr. Ubaldo Glassing Dr. Burt Knack & Dr. Cyndia Bent (TAVR)  CC: 1 month s/p TAVR   History of Present Illness:  Chad Rollins is a 82 y.o. male with a history of rheumatoid arthritis, HTN, DMT2, CAD s/p DES to RCA on 11/06/2017, peripheral neuropathy, prior stroke with significant visual impairment and severe aortic stenosis s/p TAVR (12/05/17) who presents to clinic for follow up.   Hehas had a heart murmur for many years and has been followed for aortic stenosis with serial exams and echo studies. He recently had a CXR demonstrating interstitial pulmonary edema and there was concern that aortic stenosis is now precipitating diastolic heart failure. He was referred back to Dr Ubaldo Glassing who performed an echo demonstrating worsening aortic stenosis with a mean transaortic gradient of 52 mmHg.He was referred with Dr. Burt Knack with the multidisciplinary valve team. [facility] 11/06/17 showed severe 2V CAD with severe stenosis of a large, dominant RCA and severe stenosis of a small OM branch as well as severe AS with a mean gradient of 54 mm Hg and AVA 0.83. He underwentsuccessful PCI/DES x1 to the mRCAand placed on ASA and plavix.   He underwent successful TAVR with a 26 mm EdwardsSapien THVvia the TF approach on 12/05/2017.Postoperative echoshowed normally functioningaortic valve prosthesis with noPVL, mean gradient 7 mm Hg. He had a new LBBB and transient bradycardia postoperatively and home metoprolol was discontinued. He was discharged on ASA and Plavix.   He was readmitted from 6/13-6/14/19 for generalized weakness. A rash was noted on his leg (? Secondary to atheroemboli) and white count mildly elevated. His lasix was switched to HCTZ.  At [facility] follow up he had a diffuse  erythematous rash on dorsum of right leg and pain underneath knee. Follow up arterial dopplers showed chronic arterial disease but nothing acute.  Today he presents to clinic for follow up. He has been doing well. LE pain has mostly resolved. Rash has also resolved. He was minimally symptomatic prior to TAVR and feels the same. His biggest complaint is a headache he has had since his surgery. He has not tried anything for it such as tylenol. No CP or SOB. No LE edema, orthopnea or PND. No dizziness or syncope. No blood in stool or urine. No palpitations.     Past Medical History:  Diagnosis Date  . BPH with obstruction/lower urinary tract symptoms   . CAD (coronary artery disease)    a. 11/2017 PCI: LM nl, LAD mild diff dzs, LCX, mild diff dzs, OM1 small, 99ost, RCA 90p/m (3.5x18 Synergy DES).  . Continuous right lower quadrant pain   . Diabetes mellitus ([place])   . HLD (hyperlipidemia)   . HTN (hypertension)   . Hypothyroidism   . Injury of right rotator cuff   . LBBB (left bundle branch block)    a. developed post-TAVR 12/2017.  . Obesity   . Osteoarthritis   . Peripheral neuropathy   . Rheumatoid arthritis ([place])   . Severe aortic stenosis    a. 12/2017 s/p TAVR w/ Edwards Sapien 3 (size 31m,  model #[id number], [phone]); b. 12/2017 Echo: nl fxning AoV w/ no PVL. Mean gradient 29mHg.  . Stroke ([facility]     Past Surgical History:  Procedure Laterality Date  . CHOLECYSTECTOMY    . CORONARY STENT INTERVENTION N/A 11/06/2017   Procedure: CORONARY STENT INTERVENTION;  Surgeon: CSherren Mocha MD;  [facility];  Service: Cardiovascular;  Laterality: N/A;  . INTRAOPERATIVE TRANSTHORACIC ECHOCARDIOGRAM N/A 12/05/2017   Procedure: INTRAOPERATIVE TRANSTHORACIC ECHOCARDIOGRAM;  Surgeon: CSherren Mocha MD;  [place] [facility]  Service: Open Heart Surgery;  Laterality: N/A;  . Lipoma excisions    . RIGHT/LEFT HEART CATH AND CORONARY ANGIOGRAPHY N/A 11/06/2017   Procedure: RIGHT/LEFT  HEART CATH AND CORONARY ANGIOGRAPHY;  Surgeon: CSherren Mocha MD;  [facility];  Service: Cardiovascular;  Laterality: N/A;  . Surgery for sleep apnea    . TONSILLECTOMY    . TRANSCATHETER AORTIC VALVE REPLACEMENT, TRANSFEMORAL N/A 12/05/2017   Procedure: TRANSCATHETER AORTIC VALVE REPLACEMENT, TRANSFEMORAL;  Surgeon: CSherren Mocha MD;  [place] [facility]  Service: Open Heart Surgery;  Laterality: N/A;    Current Medications: Outpatient Medications Prior to Visit  Medication Sig Dispense Refill  . aspirin EC 81 MG tablet Take 81 mg by mouth daily.    .Marland Kitchenatorvastatin (LIPITOR) 20 MG tablet Take 20 mg by mouth at bedtime.     . brimonidine (ALPHAGAN) 0.2 % ophthalmic solution Place 1 drop into both eyes 2 (two) times daily.     . busPIRone (BUSPAR) 15 MG tablet Take 15 mg by mouth 2 (two) times daily.     . Cholecalciferol (VITAMIN D3) 2000 units capsule Take 2,000 Units by mouth 2 (two) times daily.     . clopidogrel (PLAVIX) 75 MG tablet Take 75 mg by mouth at bedtime.     . dorzolamide-timolol (COSOPT) 22.3-6.8 MG/ML ophthalmic solution Place 1 drop into both eyes 2 (two) times daily.    . finasteride (PROSCAR) 5 MG tablet Take 1 tablet (5 mg total) by mouth daily. 30 tablet 12  . glipiZIDE (GLUCOTROL) 5 MG tablet Take 5 mg by mouth 2 (two) times daily before a meal.     . hydrochlorothiazide (HYDRODIURIL) 25 MG tablet Take 0.5 tablets (12.5 mg total) by mouth daily. 30 tablet 6  . latanoprost (XALATAN) 0.005 % ophthalmic solution Place 1 drop into both eyes at bedtime.     .Marland Kitchenlevothyroxine (SYNTHROID, LEVOTHROID) 75 MCG tablet Take 75 mcg by mouth daily before breakfast.    . losartan (COZAAR) 50 MG tablet Take 50 mg by mouth 2 (two) times daily.     . Melatonin 5 MG TABS Take 10 mg by mouth at bedtime.     . metFORMIN (GLUCOPHAGE) 1000 MG tablet Take 1,000 mg by mouth 2 (two) times daily with a meal.    . Multiple Vitamins-Minerals (PRESERVISION AREDS 2) CAPS Take 1  capsule by mouth 2 (two) times daily.    . nitroGLYCERIN (NITROSTAT) 0.4 MG SL tablet Place 1 tablet (0.4 mg total) under the tongue every 5 (five) minutes as needed. (Patient taking differently: Place 0.4 mg under the tongue every 5 (five) minutes as needed for chest pain. ) 25 tablet 2  . pantoprazole (PROTONIX) 40 MG tablet Take 1 tablet (40 mg total) by mouth daily. 30 tablet 2  . tamsulosin (FLOMAX) 0.4 MG CAPS capsule Take 1 capsule (0.4 mg total) by mouth daily after supper. 30 capsule 12  . vitamin B-12 (CYANOCOBALAMIN) 1000 MCG tablet Take 1,000 mcg by  mouth at bedtime.     No facility-administered medications prior to visit.      Allergies:   Acetaminophen   Social History   Socioeconomic History  . Marital status: Widowed    Spouse name: Not on file  . Number of children: Not on file  . Years of education: Not on file  . Highest education level: Not on file  Occupational History  . Occupation: retired  Scientific laboratory technician  . Financial resource strain: Not on file  . Food insecurity:    Worry: Not on file    Inability: Not on file  . Transportation needs:    Medical: Not on file    Non-medical: Not on file  Tobacco Use  . Smoking status: Former Smoker    Last attempt to quit: 10/14/1973    Years since quitting: 44.3  . Smokeless tobacco: Former Systems developer  . Tobacco comment: quit 1975  Substance and Sexual Activity  . Alcohol use: No    Alcohol/week: 0.0 oz  . Drug use: No  . Sexual activity: Not on file  Lifestyle  . Physical activity:    Days per week: Not on file    Minutes per session: Not on file  . Stress: Not on file  Relationships  . Social connections:    Talks on phone: Not on file    Gets together: Not on file    Attends religious service: Not on file    Active member of club or organization: Not on file    Attends meetings of clubs or organizations: Not on file    Relationship status: Not on file  Other Topics Concern  . Not on file  Social History  Narrative  . Not on file     Family History:  The patient's family history includes Diabetes in his mother; Hypertension in his mother.      ROS:   Please see the history of present illness.    ROS All other systems reviewed and are negative.   PHYSICAL EXAM:   VS:  BP 122/72   Pulse 69   Ht 6' (1.829 m)   Wt 205 lb (93 kg)   SpO2 98%   BMI 27.80 kg/m    GEN: Well nourished, well developed, in no acute distress  HEENT: normal  Neck: no JVD, carotid bruits, or masses Cardiac: RRR; no murmurs, rubs, or gallops,no edema  Respiratory:  clear to auscultation bilaterally, normal work of breathing GI: soft, nontender, nondistended, + BS MS: no deformity or atrophy  Skin: warm and dry, no rash Neuro:  Alert and Oriented x 3, Strength and sensation are intact Psych: euthymic mood, full affect   Wt Readings from Last 3 Encounters:  01/22/18 205 lb (93 kg)  12/20/17 207 lb 6.4 oz (94.1 kg)  12/15/17 198 lb 3.1 oz (89.9 kg)      Studies/Labs Reviewed:   EKG:  EKG is NOT ordered today.    Recent Labs: 12/04/2017: B Natriuretic Peptide 149.8 12/06/2017: Magnesium 1.6 12/14/2017: ALT 19 12/15/2017: BUN 24; Creatinine, Ser 1.24; Hemoglobin 12.0; Platelets 145; Potassium 4.1; Sodium 141   Lipid Panel    Component Value Date/Time   CHOL 99 09/18/2015 0444   CHOL 148 08/08/2014 0522   TRIG 128 09/18/2015 0444   TRIG 195 08/08/2014 0522   HDL 28 (L) 09/18/2015 0444   HDL 28 (L) 08/08/2014 0522   CHOLHDL 3.5 09/18/2015 0444   VLDL 26 09/18/2015 0444   VLDL 39 08/08/2014 0522  [place] 45 09/18/2015 2297   LGXQJJH 41 08/08/2014 0522    Additional studies/ records that were reviewed today include:  TAVR OPERATIVE NOTE   Date of Procedure:12/05/2017  Preoperative Diagnosis:Severe Aortic Stenosis  Procedure:   Transcatheter Aortic Valve Replacement - Percutaneous Transfemoral Approach Edwards Sapien 3 THV (size 36m, model #  9600TFX, serial # [id number]  Co-Surgeons:Bryan KAlveria Apley MD and MSherren Mocha MD   Pre-operative Echo Findings: severe aortic stenosis ? normalleft ventricular systolic function  Post-operative Echo Findings: ? noparavalvular leak ? normalleft ventricular systolic function  ________________  Post operative echo 12/06/17 Study Conclusions - Procedure narrative: Transthoracic echocardiography. Image quality was poor. - Left ventricle: The cavity size was normal. Wall thickness was increased in a pattern of moderate LVH. Systolic function was vigorous. The estimated ejection fraction was in the range of 65% to 70%. - Aortic valve: s/p Edwards Sapien 3 THV (26 mm) - no obvious paravalvular leak or obstruction. Mean gradient (S): 7 mm Hg. Peak gradient (S): 14 mm Hg. Valve area (VTI): 1.89 cm^2. Valve area (Vmax): 1.54 cm^2. Valve area (Vmean): 1.65 cm^2. - Pericardium, extracardiac: There was no pericardial effusion. Impressions: - Technically difficult study. Compared to a prior study on 12/05/2017, there has been interval placement of an Edwards Sapien 3 THV (26 mm) without paravalvular leak or obstruction.  _________________   2D ECHO 01/22/18 (1 month s/p TAVR) Study Conclusions  - Left ventricle: The cavity size was normal. Wall thickness was increased in a pattern of mild LVH. Systolic function was normal. The estimated ejection fraction was in the range of 55% to 60%. Incoordinate septal motion. Doppler parameters are consistent with abnormal left ventricular relaxation (grade 1 diastolic dysfunction). The E/e&' ratio is >15, suggesting elevated LV filling pressure. - Aortic valve: s/p Sapien 3 TAVR. No obstruction or paravalvular leak. Mean gradient (S): 11 mm Hg. Peak gradient (S): 22 mm Hg. Valve area (VTI): 1.11 cm^2. Valve area (Vmax): 0.9 cm^2. - Mitral valve: Mildly thickened  leaflets . MAC. Mild MR. - Left atrium: Moderately dilated. - Inferior vena cava: The vessel was normal in size. The respirophasic diameter changes were in the normal range (>= 50%), consistent with normal central venous pressure. Impressions - Compared to a prior study in 12/2017, the LVEF is higher at 55-60%. The TAVR valve is stable without paravalvular leak.   ASSESSMENT & PLAN:   Severe AS s/p TAVR: 2D ECHO today shows EF 55-60%, normally functioning TAVR with mean gradient 11 mm Hg and no PVL. He has NYHA class I symptoms. SBE prophylaxis discussed; amoxil sent into his pharmacy. He can discontinue plavix after 6 months of therapy s/p TAVR (06/2018). Per cath note, he only needs DAPT x 6 months. I will see him back in 1 year.   HTN:BP well controlled today   CAD: s/p recentDES to RCA 11/06/17.6 months of DAPT recommended.Continue aspirin and Plavix, statin.  DMT2:continue same regimen  Leg pain/rash: resolved.   Headache: he was started on HCTZ shortly after his valve surgery. HA is a listed side effect of this. I wonder if it is related. Daughter would like to keep everything the same since he is doing so well otherwise. I have encouraged him to try Tylenol    Medication Adjustments/Labs and Tests Ordered: Current medicines are reviewed at length with the patient today.  Concerns regarding medicines are outlined above.  Medication changes, Labs and Tests ordered today are listed in the Patient Instructions below. Patient Instructions  Medication Instructions:  1) STOP PLAVIX 06/06/2018 2) We have discussed the importance of taking an antibiotic prior to any dental, gastrointestinal, genitourinary procedures to prevent damage to the heart valves from infection. You were given a prescription for an antibiotic based on current SBE prophylaxis guidelines. We have called you in AMOXIL 2,000 mg to take 1 hour prior to dental  visits.  Labwork: None  Testing/Procedures: Your provider has requested that you have an echocardiogram in 1 year. Echocardiography is a painless test that uses sound waves to create images of your heart. It provides your doctor with information about the size and shape of your heart and how well your heart's chambers and valves are working. This procedure takes approximately one hour. There are no restrictions for this procedure.  Follow-Up: Your provider recommends that you schedule a follow-up appointment in 3 months with Dr. Ubaldo Glassing.  Your provider wants you to follow-up in: 1 year with the Structural Heart Team. You will receive a reminder letter in the mail two months in advance. If you don't receive a letter, please call our office to schedule the follow-up appointment.    Any Other Special Instructions Will Be Listed Below (If Applicable).     If you need a refill on your cardiac medications before your next appointment, please call your pharmacy.      Signed, Angelena Form, PA-C  01/22/2018 2:48 PM    [facility] [place], [place], [place]  [address] Phone: [phone]; Fax: [phone]

## 2018-01-22 ENCOUNTER — Ambulatory Visit (HOSPITAL_COMMUNITY): Payer: PPO | Attending: Internal Medicine

## 2018-01-22 ENCOUNTER — Other Ambulatory Visit: Payer: Self-pay

## 2018-01-22 ENCOUNTER — Ambulatory Visit (INDEPENDENT_AMBULATORY_CARE_PROVIDER_SITE_OTHER): Payer: PPO | Admitting: Physician Assistant

## 2018-01-22 ENCOUNTER — Encounter: Payer: Self-pay | Admitting: Physician Assistant

## 2018-01-22 ENCOUNTER — Other Ambulatory Visit: Payer: Self-pay | Admitting: Physician Assistant

## 2018-01-22 VITALS — BP 122/72 | HR 69 | Ht 72.0 in | Wt 205.0 lb

## 2018-01-22 DIAGNOSIS — I2510 Atherosclerotic heart disease of native coronary artery without angina pectoris: Secondary | ICD-10-CM

## 2018-01-22 DIAGNOSIS — E785 Hyperlipidemia, unspecified: Secondary | ICD-10-CM | POA: Insufficient documentation

## 2018-01-22 DIAGNOSIS — I110 Hypertensive heart disease with heart failure: Secondary | ICD-10-CM | POA: Diagnosis not present

## 2018-01-22 DIAGNOSIS — I447 Left bundle-branch block, unspecified: Secondary | ICD-10-CM | POA: Insufficient documentation

## 2018-01-22 DIAGNOSIS — I10 Essential (primary) hypertension: Secondary | ICD-10-CM

## 2018-01-22 DIAGNOSIS — E118 Type 2 diabetes mellitus with unspecified complications: Secondary | ICD-10-CM

## 2018-01-22 DIAGNOSIS — I509 Heart failure, unspecified: Secondary | ICD-10-CM | POA: Insufficient documentation

## 2018-01-22 DIAGNOSIS — E119 Type 2 diabetes mellitus without complications: Secondary | ICD-10-CM | POA: Diagnosis not present

## 2018-01-22 DIAGNOSIS — Z952 Presence of prosthetic heart valve: Secondary | ICD-10-CM | POA: Diagnosis not present

## 2018-01-22 DIAGNOSIS — R001 Bradycardia, unspecified: Secondary | ICD-10-CM | POA: Diagnosis not present

## 2018-01-22 DIAGNOSIS — I350 Nonrheumatic aortic (valve) stenosis: Secondary | ICD-10-CM | POA: Insufficient documentation

## 2018-01-22 DIAGNOSIS — M79604 Pain in right leg: Secondary | ICD-10-CM | POA: Diagnosis not present

## 2018-01-22 MED ORDER — AMOXICILLIN 500 MG PO TABS: ORAL_TABLET | ORAL | 6 refills | Status: AC

## 2018-01-22 NOTE — Patient Instructions (Addendum)
Medication Instructions:  1) STOP PLAVIX 06/06/2018 2) We have discussed the importance of taking an antibiotic prior to any dental, gastrointestinal, genitourinary procedures to prevent damage to the heart valves from infection. You were given a prescription for an antibiotic based on current SBE prophylaxis guidelines. We have called you in AMOXIL 2,000 mg to take 1 hour prior to dental visits.  Labwork: None  Testing/Procedures: Your provider has requested that you have an echocardiogram in 1 year. Echocardiography is a painless test that uses sound waves to create images of your heart. It provides your doctor with information about the size and shape of your heart and how well your heart's chambers and valves are working. This procedure takes approximately one hour. There are no restrictions for this procedure.  Follow-Up: Your provider recommends that you schedule a follow-up appointment in 3 months with Dr. Ubaldo Glassing.  Your provider wants you to follow-up in: 1 year with the Structural Heart Team. You will receive a reminder letter in the mail two months in advance. If you don't receive a letter, please call our office to schedule the follow-up appointment.    Any Other Special Instructions Will Be Listed Below (If Applicable).     If you need a refill on your cardiac medications before your next appointment, please call your pharmacy.

## 2018-01-23 ENCOUNTER — Ambulatory Visit: Payer: PPO | Admitting: Urology

## 2018-01-24 NOTE — Progress Notes (Signed)
01/25/2018 1:50 PM   Chad Rollins 1935-09-02 [id number]  Referring provider: Idelle Crouch, MD [facility] [place], [place] [address]  Chief Complaint  Patient presents with  . Benign Prostatic Hypertrophy    1year    HPI: Chad Rollins is a 82 year old Caucasian male who has a BPH with LUTS and a history of nephrolithiasis who presents for a 12 months follow up with daughter, Chad Rollins.    BPH with LU TS (prostate and/or bladder) His IPSS score is 3, which is mild lower urinary tract symptomatology.  He is delighted with his quality life due to his urinary symptoms.   His previous I PSS score was 3/2.  He has not had any recent fevers, chills, nausea or vomiting. He also denies any dysuria, gross hematuria or suprapubic pain. IPSS    Row Name 01/25/18 1300         International Prostate Symptom Score   How often have you had the sensation of not emptying your bladder?  Not at All     How often have you had to urinate less than every two hours?  Not at All     How often have you found you stopped and started again several times when you urinated?  Not at All     How often have you found it difficult to postpone urination?  Not at All     How often have you had a weak urinary stream?  Not at All     How often have you had to strain to start urination?  Not at All     How many times did you typically get up at night to urinate?  3 Times     Total IPSS Score  3       Quality of Life due to urinary symptoms   If you were to spend the rest of your life with your urinary condition just the way it is now how would you feel about that?  Delighted        Score:  1-7 Mild 8-19 Moderate 20-35 Severe  History of nephrolithiasis Patient has not had any flank pain or gross hematuria. He has not had any passage of stones.  PMH: Past Medical History:  Diagnosis Date  . BPH with obstruction/lower urinary tract symptoms   . CAD (coronary artery disease)     a. 11/2017 PCI: LM nl, LAD mild diff dzs, LCX, mild diff dzs, OM1 small, 99ost, RCA 90p/m (3.5x18 Synergy DES).  . Continuous right lower quadrant pain   . Diabetes mellitus ([place])   . HLD (hyperlipidemia)   . HTN (hypertension)   . Hypothyroidism   . Injury of right rotator cuff   . LBBB (left bundle branch block)    a. developed post-TAVR 12/2017.  . Obesity   . Osteoarthritis   . Peripheral neuropathy   . Rheumatoid arthritis ([place])   . Severe aortic stenosis    a. 12/2017 s/p TAVR w/ Edwards Sapien 3 (size 68mm, model #9600TFX, [phone]); b. 12/2017 Echo: nl fxning AoV w/ no PVL. Mean gradient 61mmHg.  . Stroke [facility])     Surgical History: Past Surgical History:  Procedure Laterality Date  . CHOLECYSTECTOMY    . CORONARY STENT INTERVENTION N/A 11/06/2017   Procedure: CORONARY STENT INTERVENTION;  Surgeon: Sherren Mocha, MD;  Location: [facility];  Service: Cardiovascular;  Laterality: N/A;  . INTRAOPERATIVE TRANSTHORACIC ECHOCARDIOGRAM N/A 12/05/2017   Procedure: INTRAOPERATIVE  TRANSTHORACIC ECHOCARDIOGRAM;  Surgeon: Sherren Mocha, MD;  Location: [facility];  Service: Open Heart Surgery;  Laterality: N/A;  . Lipoma excisions    . RIGHT/LEFT HEART CATH AND CORONARY ANGIOGRAPHY N/A 11/06/2017   Procedure: RIGHT/LEFT HEART CATH AND CORONARY ANGIOGRAPHY;  Surgeon: Sherren Mocha, MD;  Location: [facility];  Service: Cardiovascular;  Laterality: N/A;  . Surgery for sleep apnea    . TONSILLECTOMY    . TRANSCATHETER AORTIC VALVE REPLACEMENT, TRANSFEMORAL N/A 12/05/2017   Procedure: TRANSCATHETER AORTIC VALVE REPLACEMENT, TRANSFEMORAL;  Surgeon: Sherren Mocha, MD;  Location: [facility];  Service: Open Heart Surgery;  Laterality: N/A;    Home Medications:  Allergies as of 01/25/2018      Reactions   Acetaminophen Other (See Comments)   Long period of time, effects kidney      Medication List        Accurate as of 01/25/18  1:50 PM. Always use your most recent med list.            amoxicillin 500 MG tablet Commonly known as:  AMOXIL Take 4 tablets (2,000 mg) 1 hour prior to dental visits.   aspirin EC 81 MG tablet Take 81 mg by mouth daily.   atorvastatin 20 MG tablet Commonly known as:  LIPITOR Take 20 mg by mouth at bedtime.   brimonidine 0.2 % ophthalmic solution Commonly known as:  ALPHAGAN Place 1 drop into both eyes 2 (two) times daily.   busPIRone 15 MG tablet Commonly known as:  BUSPAR Take 15 mg by mouth 2 (two) times daily.   clopidogrel 75 MG tablet Commonly known as:  PLAVIX Take 75 mg by mouth at bedtime.   dorzolamide-timolol 22.3-6.8 MG/ML ophthalmic solution Commonly known as:  COSOPT Place 1 drop into both eyes 2 (two) times daily.   finasteride 5 MG tablet Commonly known as:  PROSCAR Take 1 tablet (5 mg total) by mouth daily.   glipiZIDE 5 MG tablet Commonly known as:  GLUCOTROL Take 5 mg by mouth 2 (two) times daily before a meal.   hydrochlorothiazide 25 MG tablet Commonly known as:  HYDRODIURIL Take 0.5 tablets (12.5 mg total) by mouth daily.   latanoprost 0.005 % ophthalmic solution Commonly known as:  XALATAN Place 1 drop into both eyes at bedtime.   levothyroxine 75 MCG tablet Commonly known as:  SYNTHROID, LEVOTHROID Take 75 mcg by mouth daily before breakfast.   losartan 50 MG tablet Commonly known as:  COZAAR Take 50 mg by mouth 2 (two) times daily.   Melatonin 5 MG Tabs Take 10 mg by mouth at bedtime.   metFORMIN 1000 MG tablet Commonly known as:  GLUCOPHAGE Take 1,000 mg by mouth 2 (two) times daily with a meal.   nitroGLYCERIN 0.4 MG SL tablet Commonly known as:  NITROSTAT Place 1 tablet (0.4 mg total) under the tongue every 5 (five) minutes as needed.   pantoprazole 40 MG tablet Commonly known as:  PROTONIX Take 1 tablet (40 mg total) by mouth daily.   PRESERVISION AREDS 2 Caps Take 1 capsule by mouth 2 (two) times daily.   tamsulosin 0.4 MG Caps capsule Commonly known as:   FLOMAX Take 1 capsule (0.4 mg total) by mouth daily after supper.   vitamin B-12 1000 MCG tablet Commonly known as:  CYANOCOBALAMIN Take 1,000 mcg by mouth at bedtime.   Vitamin D3 2000 units capsule Take 2,000 Units by mouth 2 (two) times daily.       Allergies:  Allergies  Allergen Reactions  . Acetaminophen Other (See Comments)    Long period of time, effects kidney    Family History: Family History  Problem Relation Age of Onset  . Diabetes Mother   . Hypertension Mother   . Prostate cancer Neg Hx   . Bladder Cancer Neg Hx   . Kidney disease Neg Hx     Social History:  reports that he quit smoking about 44 years ago. He has quit using smokeless tobacco. He reports that he does not drink alcohol or use drugs.  ROS: UROLOGY Frequent Urination?: No Hard to postpone urination?: No Burning/pain with urination?: No Get up at night to urinate?: No Leakage of urine?: No Urine stream starts and stops?: No Trouble starting stream?: No Do you have to strain to urinate?: No Blood in urine?: No Urinary tract infection?: No Sexually transmitted disease?: No Injury to kidneys or bladder?: No Painful intercourse?: No Weak stream?: No Erection problems?: No Penile pain?: No  Gastrointestinal Nausea?: No Vomiting?: No Indigestion/heartburn?: No Diarrhea?: No Constipation?: No  Constitutional Fever: No Night sweats?: No Weight loss?: No Fatigue?: No  Skin Skin rash/lesions?: No Itching?: No  Eyes Blurred vision?: No Double vision?: No  Ears/Nose/Throat Sore throat?: No Sinus problems?: No  Hematologic/Lymphatic Swollen glands?: No Easy bruising?: No  Cardiovascular Leg swelling?: No Chest pain?: No  Respiratory Cough?: No Shortness of breath?: No  Endocrine Excessive thirst?: No  Musculoskeletal Back pain?: No Joint pain?: No  Neurological Headaches?: No Dizziness?: No  Psychologic Depression?: No Anxiety?: No  Physical Exam: BP  (!) 169/63   Pulse 76   Ht 5\' 11"  (1.803 m)   Wt 206 lb (93.4 kg)   BMI 28.73 kg/m   Constitutional: Well nourished. Alert and oriented, No acute distress. HEENT: [place] AT, moist mucus membranes. Trachea midline, no masses. Cardiovascular: No clubbing, cyanosis, or edema. Respiratory: Normal respiratory effort, no increased work of breathing. GI: Abdomen is soft, non tender, non distended, no abdominal masses. Liver and spleen not palpable.  No hernias appreciated.  Stool sample for occult testing is not indicated.   GU: No CVA tenderness.  No bladder fullness or masses.  Patient with circumcised phallus.  Urethral meatus is patent.  No penile discharge. No penile lesions or rashes. Scrotum without lesions, cysts, rashes and/or edema.  Testicles are located scrotally bilaterally. No masses are appreciated in the testicles. Left and right epididymis are normal. Rectal: Patient with  normal sphincter tone. Anus and perineum without scarring or rashes. No rectal masses are appreciated. Prostate is approximately 45 grams, no nodules are appreciated. Seminal vesicles are normal. Skin: No rashes, bruises or suspicious lesions. Lymph: No cervical or inguinal adenopathy. Neurologic: Grossly intact, no focal deficits, moving all 4 extremities. Psychiatric: Normal mood and affect.   Laboratory Data:  PSA of 0.7 on 07/23/2014.  Results for orders placed or performed during the hospital encounter of 12/14/17  Culture, blood (Routine X 2) w Reflex to ID Panel  Result Value Ref Range   Specimen Description BLOOD LEFT HAND    Special Requests      BOTTLES DRAWN AEROBIC ONLY Blood Culture adequate volume Performed at [facility] [address], [place], [place] [address]    Culture NO GROWTH 5 DAYS    Report Status 12/20/2017 FINAL   Culture, blood (Routine X 2) w Reflex to ID Panel  Result Value Ref Range   Specimen Description BLOOD RIGHT ANTECUBITAL    Special Requests  BOTTLES  DRAWN AEROBIC AND ANAEROBIC Blood Culture adequate volume Performed at [facility], [place] [address] [place], [place] [address]    Culture NO GROWTH 5 DAYS    Report Status 12/20/2017 FINAL   Basic metabolic panel  Result Value Ref Range   Sodium 140 135 - 145 mmol/L   Potassium 4.3 3.5 - 5.1 mmol/L   Chloride 104 101 - 111 mmol/L   CO2 23 22 - 32 mmol/L   Glucose, Bld 138 (H) 65 - 99 mg/dL   BUN 31 (H) 6 - 20 mg/dL   Creatinine, Ser 1.42 (H) 0.61 - 1.24 mg/dL   Calcium 9.1 8.9 - 10.3 mg/dL   GFR calc non Af Amer 45 (L) >60 mL/min   GFR calc Af Amer 52 (L) >60 mL/min   Anion gap 13 5 - 15  CBC  Result Value Ref Range   WBC 12.8 (H) 4.0 - 10.5 K/uL   RBC 3.90 (L) 4.22 - 5.81 MIL/uL   Hemoglobin 12.0 (L) 13.0 - 17.0 g/dL   HCT 36.1 (L) 39.0 - 52.0 %   MCV 92.6 78.0 - 100.0 fL   MCH 30.8 26.0 - 34.0 pg   MCHC 33.2 30.0 - 36.0 g/dL   RDW 13.7 11.5 - 15.5 %   Platelets 132 (L) 150 - 400 K/uL  Urinalysis, Routine w reflex microscopic  Result Value Ref Range   Color, Urine YELLOW YELLOW   APPearance CLEAR CLEAR   Specific Gravity, Urine 1.013 1.005 - 1.030   pH 7.0 5.0 - 8.0   Glucose, UA NEGATIVE NEGATIVE mg/dL   Hgb urine dipstick NEGATIVE NEGATIVE   Bilirubin Urine NEGATIVE NEGATIVE   Ketones, ur 5 (A) NEGATIVE mg/dL   Protein, ur NEGATIVE NEGATIVE mg/dL   Nitrite NEGATIVE NEGATIVE   Leukocytes, UA NEGATIVE NEGATIVE  Troponin I  Result Value Ref Range   Troponin I <0.03 <0.03 ng/mL  Hepatic function panel  Result Value Ref Range   Total Protein 6.3 (L) 6.5 - 8.1 g/dL   Albumin 3.8 3.5 - 5.0 g/dL   AST 23 15 - 41 U/L   ALT 19 17 - 63 U/L   Alkaline Phosphatase 70 38 - 126 U/L   Total Bilirubin 1.2 0.3 - 1.2 mg/dL   Bilirubin, Direct 0.3 0.1 - 0.5 mg/dL   Indirect Bilirubin 0.9 0.3 - 0.9 mg/dL  Troponin I  Result Value Ref Range   Troponin I <0.03 <0.03 ng/mL  Glucose, capillary  Result Value Ref Range   Glucose-Capillary 332 (H) 65 - 99 mg/dL  Basic  metabolic panel  Result Value Ref Range   Sodium 140 135 - 145 mmol/L   Potassium 4.0 3.5 - 5.1 mmol/L   Chloride 105 101 - 111 mmol/L   CO2 26 22 - 32 mmol/L   Glucose, Bld 347 (H) 65 - 99 mg/dL   BUN 26 (H) 6 - 20 mg/dL   Creatinine, Ser 1.32 (H) 0.61 - 1.24 mg/dL   Calcium 8.8 (L) 8.9 - 10.3 mg/dL   GFR calc non Af Amer 49 (L) >60 mL/min   GFR calc Af Amer 57 (L) >60 mL/min   Anion gap 9 5 - 15  Glucose, capillary  Result Value Ref Range   Glucose-Capillary 179 (H) 65 - 99 mg/dL  CBC  Result Value Ref Range   WBC 14.4 (H) 4.0 - 10.5 K/uL   RBC 3.91 (L) 4.22 - 5.81 MIL/uL   Hemoglobin 12.0 (L) 13.0 - 17.0 g/dL  HCT 36.9 (L) 39.0 - 52.0 %   MCV 94.4 78.0 - 100.0 fL   MCH 30.7 26.0 - 34.0 pg   MCHC 32.5 30.0 - 36.0 g/dL   RDW 13.5 11.5 - 15.5 %   Platelets 145 (L) 150 - 400 K/uL  Basic metabolic panel  Result Value Ref Range   Sodium 141 135 - 145 mmol/L   Potassium 4.1 3.5 - 5.1 mmol/L   Chloride 107 101 - 111 mmol/L   CO2 26 22 - 32 mmol/L   Glucose, Bld 162 (H) 65 - 99 mg/dL   BUN 24 (H) 6 - 20 mg/dL   Creatinine, Ser 1.24 0.61 - 1.24 mg/dL   Calcium 9.0 8.9 - 10.3 mg/dL   GFR calc non Af Amer 53 (L) >60 mL/min   GFR calc Af Amer >60 >60 mL/min   Anion gap 8 5 - 15  Glucose, capillary  Result Value Ref Range   Glucose-Capillary 145 (H) 65 - 99 mg/dL  CBG monitoring, ED  Result Value Ref Range   Glucose-Capillary 105 (H) 65 - 99 mg/dL  ECHOCARDIOGRAM LIMITED  Result Value Ref Range   Weight 3,171.1 oz   Height 72 in   BP 169/70 mmHg   Lab Results  Component Value Date   WBC 14.4 (H) 12/15/2017   HGB 12.0 (L) 12/15/2017   HCT 36.9 (L) 12/15/2017   MCV 94.4 12/15/2017   PLT 145 (L) 12/15/2017    Lab Results  Component Value Date   CREATININE 1.24 12/15/2017     Lab Results  Component Value Date   HGBA1C 5.9 (H) 12/04/2017   I have reviewed the labs   Assessment & Plan:    1. BPH (benign prostatic hyperplasia) with LUTS:   IPSS 3/0.  He will  continue the finasteride.  He will follow-up in one year's time for IPSS score and PVR.  2. History of nephrolithiasis:   Right lower ureteral stone passed spontaneously on 12/2015.   Return in about 1 year (around 01/26/2019) for I PSS and exam .  Zara Council, [facility]  [place] [address] [place], [place] [address] [phone]

## 2018-01-25 ENCOUNTER — Ambulatory Visit: Payer: PPO | Admitting: Urology

## 2018-01-25 ENCOUNTER — Encounter: Payer: Self-pay | Admitting: Urology

## 2018-01-25 VITALS — BP 169/63 | HR 76 | Ht 71.0 in | Wt 206.0 lb

## 2018-01-25 DIAGNOSIS — N138 Other obstructive and reflux uropathy: Secondary | ICD-10-CM

## 2018-01-25 DIAGNOSIS — N401 Enlarged prostate with lower urinary tract symptoms: Secondary | ICD-10-CM

## 2018-01-25 DIAGNOSIS — Z87442 Personal history of urinary calculi: Secondary | ICD-10-CM | POA: Diagnosis not present

## 2018-01-25 MED ORDER — FINASTERIDE 5 MG PO TABS: 5.0000 mg | ORAL_TABLET | Freq: Every day | ORAL | 12 refills | Status: DC

## 2018-02-06 ENCOUNTER — Encounter: Payer: Self-pay | Admitting: Thoracic Surgery (Cardiothoracic Vascular Surgery)

## 2018-03-15 ENCOUNTER — Other Ambulatory Visit: Payer: Self-pay | Admitting: *Deleted

## 2018-03-15 NOTE — Patient Outreach (Signed)
[facility]) Care Management  03/15/2018  Chad Rollins 07/04/1936 [id number]   RN Health Coach telephone call to patient.  Hipaa compliance verified. Person answering phone identified herself as the patient daughter Chad Rollins. She stated that the patient is very hard of hearing and that was why she is talking to me. Per Chad Rollins the patient is eating and doing good. He does sometimes she stated eat what he is not suppose to but his blood sugars are running good .RN asked what are his blood sugars. She stated again running good with no definite answer.  Patient has a caregiver that comes four days a week. Per Chad Rollins the patient does not need any  pharmacy or social worker assistance.   Assessment  A1C is 5.8 Caregiver 4x week Patient is taking medications.   Plan: RN will send educational material on Healthy Snacks  RN will send Living well with Diabetes book  RN will follow up outreach within the month of December for quarterly checks  [place] Management [phone]

## 2018-03-22 DIAGNOSIS — I10 Essential (primary) hypertension: Secondary | ICD-10-CM | POA: Diagnosis not present

## 2018-03-22 DIAGNOSIS — I739 Peripheral vascular disease, unspecified: Secondary | ICD-10-CM | POA: Diagnosis not present

## 2018-03-22 DIAGNOSIS — Z79899 Other long term (current) drug therapy: Secondary | ICD-10-CM | POA: Diagnosis not present

## 2018-03-22 DIAGNOSIS — E782 Mixed hyperlipidemia: Secondary | ICD-10-CM | POA: Diagnosis not present

## 2018-03-22 DIAGNOSIS — E1142 Type 2 diabetes mellitus with diabetic polyneuropathy: Secondary | ICD-10-CM | POA: Diagnosis not present

## 2018-03-22 DIAGNOSIS — H9190 Unspecified hearing loss, unspecified ear: Secondary | ICD-10-CM | POA: Diagnosis not present

## 2018-03-22 DIAGNOSIS — E039 Hypothyroidism, unspecified: Secondary | ICD-10-CM | POA: Diagnosis not present

## 2018-03-29 DIAGNOSIS — H6063 Unspecified chronic otitis externa, bilateral: Secondary | ICD-10-CM | POA: Diagnosis not present

## 2018-03-29 DIAGNOSIS — H6123 Impacted cerumen, bilateral: Secondary | ICD-10-CM | POA: Diagnosis not present

## 2018-04-06 DIAGNOSIS — Z23 Encounter for immunization: Secondary | ICD-10-CM | POA: Diagnosis not present

## 2018-04-10 DIAGNOSIS — E782 Mixed hyperlipidemia: Secondary | ICD-10-CM | POA: Diagnosis not present

## 2018-04-10 DIAGNOSIS — E1142 Type 2 diabetes mellitus with diabetic polyneuropathy: Secondary | ICD-10-CM | POA: Diagnosis not present

## 2018-04-10 DIAGNOSIS — I739 Peripheral vascular disease, unspecified: Secondary | ICD-10-CM | POA: Diagnosis not present

## 2018-04-10 DIAGNOSIS — I350 Nonrheumatic aortic (valve) stenosis: Secondary | ICD-10-CM | POA: Diagnosis not present

## 2018-04-10 DIAGNOSIS — Z952 Presence of prosthetic heart valve: Secondary | ICD-10-CM | POA: Diagnosis not present

## 2018-04-10 DIAGNOSIS — Z8669 Personal history of other diseases of the nervous system and sense organs: Secondary | ICD-10-CM | POA: Diagnosis not present

## 2018-04-10 DIAGNOSIS — I10 Essential (primary) hypertension: Secondary | ICD-10-CM | POA: Diagnosis not present

## 2018-04-10 DIAGNOSIS — I2510 Atherosclerotic heart disease of native coronary artery without angina pectoris: Secondary | ICD-10-CM | POA: Diagnosis not present

## 2018-04-26 DIAGNOSIS — Z9889 Other specified postprocedural states: Secondary | ICD-10-CM | POA: Diagnosis not present

## 2018-05-29 DIAGNOSIS — Z9889 Other specified postprocedural states: Secondary | ICD-10-CM | POA: Diagnosis not present

## 2018-06-12 ENCOUNTER — Other Ambulatory Visit: Payer: Self-pay | Admitting: *Deleted

## 2018-06-12 NOTE — Patient Outreach (Signed)
[facility]) Care Management  06/12/2018  ENRRIQUE MIERZWA 1935-12-08 [id number]  Covering for Health Coach, Joaquim L[place]:   RNCM called to follow up. RNCM spoke with client's daughter. Two HIPPA identifiers confirmed. She reports client's blood sugar continues to remain elevated 200-300 during the day. She is unable to give morning blood sugars or any specific readings because she is at work. She reports that client is set in his ways and continues to eat what he wants. RNCM acknowledged it is a delicate balance between client's wishes in food choices and encouraged her to continue to offer healthier food choices.   Plan: update assigned Health coach. Assign emmi article. Follow up within the next 2-3 months.  [facility] CM Care Plan Problem One     Most Recent Value  Care Plan Problem One  Knowledge Deficit in Self Management of Diabetes  Role Documenting the Problem One  [place] for Problem One  Active  THN Long Term Goal   Patient and family will able to identify healthy low carb snacks  THN Long Term Goal Start Date  06/14/18  Interventions for Problem One Long Term Goal  discussed balance between client's independence and choices with recommended food choices. assigned and sent [facility] articles regarding diabetes and diet.     Thea Silversmith, RN, MSN, [place] Coordinator Cell: [phone]

## 2018-06-22 DIAGNOSIS — Z79899 Other long term (current) drug therapy: Secondary | ICD-10-CM | POA: Diagnosis not present

## 2018-06-22 DIAGNOSIS — E1142 Type 2 diabetes mellitus with diabetic polyneuropathy: Secondary | ICD-10-CM | POA: Diagnosis not present

## 2018-06-22 DIAGNOSIS — E039 Hypothyroidism, unspecified: Secondary | ICD-10-CM | POA: Diagnosis not present

## 2018-06-22 DIAGNOSIS — I10 Essential (primary) hypertension: Secondary | ICD-10-CM | POA: Diagnosis not present

## 2018-06-22 DIAGNOSIS — R1310 Dysphagia, unspecified: Secondary | ICD-10-CM | POA: Diagnosis not present

## 2018-06-22 DIAGNOSIS — E782 Mixed hyperlipidemia: Secondary | ICD-10-CM | POA: Diagnosis not present

## 2018-06-25 ENCOUNTER — Other Ambulatory Visit: Payer: Self-pay | Admitting: Internal Medicine

## 2018-06-25 DIAGNOSIS — R1310 Dysphagia, unspecified: Secondary | ICD-10-CM

## 2018-07-11 DIAGNOSIS — Z952 Presence of prosthetic heart valve: Secondary | ICD-10-CM | POA: Diagnosis not present

## 2018-07-11 DIAGNOSIS — I10 Essential (primary) hypertension: Secondary | ICD-10-CM | POA: Diagnosis not present

## 2018-07-11 DIAGNOSIS — I350 Nonrheumatic aortic (valve) stenosis: Secondary | ICD-10-CM | POA: Diagnosis not present

## 2018-07-11 DIAGNOSIS — E6609 Other obesity due to excess calories: Secondary | ICD-10-CM | POA: Diagnosis not present

## 2018-07-11 DIAGNOSIS — Z6831 Body mass index (BMI) 31.0-31.9, adult: Secondary | ICD-10-CM | POA: Diagnosis not present

## 2018-07-11 DIAGNOSIS — E782 Mixed hyperlipidemia: Secondary | ICD-10-CM | POA: Diagnosis not present

## 2018-07-11 DIAGNOSIS — E1142 Type 2 diabetes mellitus with diabetic polyneuropathy: Secondary | ICD-10-CM | POA: Diagnosis not present

## 2018-07-11 DIAGNOSIS — I2510 Atherosclerotic heart disease of native coronary artery without angina pectoris: Secondary | ICD-10-CM | POA: Diagnosis not present

## 2018-07-11 DIAGNOSIS — Z8669 Personal history of other diseases of the nervous system and sense organs: Secondary | ICD-10-CM | POA: Diagnosis not present

## 2018-07-12 DIAGNOSIS — H401133 Primary open-angle glaucoma, bilateral, severe stage: Secondary | ICD-10-CM | POA: Diagnosis not present

## 2018-07-26 DIAGNOSIS — L578 Other skin changes due to chronic exposure to nonionizing radiation: Secondary | ICD-10-CM | POA: Diagnosis not present

## 2018-07-26 DIAGNOSIS — L820 Inflamed seborrheic keratosis: Secondary | ICD-10-CM | POA: Diagnosis not present

## 2018-07-26 DIAGNOSIS — L821 Other seborrheic keratosis: Secondary | ICD-10-CM | POA: Diagnosis not present

## 2018-07-26 DIAGNOSIS — L570 Actinic keratosis: Secondary | ICD-10-CM | POA: Diagnosis not present

## 2018-07-26 DIAGNOSIS — Z85828 Personal history of other malignant neoplasm of skin: Secondary | ICD-10-CM | POA: Diagnosis not present

## 2018-08-14 ENCOUNTER — Other Ambulatory Visit: Payer: Self-pay | Admitting: *Deleted

## 2018-08-27 DIAGNOSIS — R1031 Right lower quadrant pain: Secondary | ICD-10-CM | POA: Diagnosis not present

## 2018-08-27 DIAGNOSIS — R102 Pelvic and perineal pain: Secondary | ICD-10-CM | POA: Diagnosis not present

## 2018-09-13 ENCOUNTER — Ambulatory Visit: Payer: Self-pay | Admitting: *Deleted

## 2018-10-01 NOTE — Patient Outreach (Signed)
[facility]) Care Management  [id number]  Chad Rollins 1935-12-13 [id number]   RN Health Coach attempted follow up outreach call to patient.  Patient was unavailable. HIPPA compliance voicemail message left with return callback number.  Plan: RN will call patient again within 90 days.  [facility] Care Management [phone]

## 2018-10-15 ENCOUNTER — Telehealth: Payer: Self-pay

## 2018-10-15 NOTE — Telephone Encounter (Signed)
I called pt to schedule 1 yr TAVR f/u. Spoke with his Daughter, [name]. She gave her email for a virtual visit. She is willing to help him with this visit. He can't hear or see well but she will be with him to help with this.  She did not want to schedule his yr Echo appt yet until she knows it is safe for him to be out of the house due to the COVID-19. She understands that Nell Range will email her a link to the WebEx meeting on the day of his appt.    YOUR CARDIOLOGY TEAM HAS ARRANGED FOR AN E-VISIT FOR YOUR APPOINTMENT - PLEASE REVIEW IMPORTANT INFORMATION BELOW SEVERAL DAYS PRIOR TO YOUR APPOINTMENT  Due to the recent COVID-19 pandemic, we are transitioning in-person office visits to tele-medicine visits in an effort to decrease unnecessary exposure to our patients and staff. Medicare and most insurances are covering these visits without a copay needed. We also encourage you to sign up for MyChart if you have not already done so. You will need a smartphone if possible. For patients that do not have this, we can still complete the visit using a regular telephone but do prefer a smartphone to enable video when possible. You may have a close family member that lives with you that can help. If possible, we also ask that you have a blood pressure cuff and scale at home to measure your blood pressure, heart rate and weight prior to your scheduled appointment. Patients with clinical needs that need an in-person evaluation and testing will still be able to come to the office if absolutely necessary. If you have any questions, feel free to call our office.    IF YOU HAVE A SMARTPHONE, PLEASE DOWNLOAD THE WEBEX APP TO YOUR SMARTPHONE  - If Apple, go to [employer] and type in WebEx in the search bar. [place] [employer], the blue/green circle. The app is free but as with any other app download, your phone may require you to verify saved payment information or Apple password. You do NOT have to create a  WebEx account.  - If Android, go to [employer] and type in [employer] in the search bar. [place] [employer], the blue/green circle. The app is free but as with any other app download, your phone may require you to verify saved payment information or Android password. You do NOT have to create a WebEx account.  It is very helpful to have this downloaded before your visit.    2-3 DAYS BEFORE YOUR APPOINTMENT  You will receive a telephone call from one of our [place] team members - your caller ID may say "Unknown caller." If this is a video visit, we will confirm that you have been able to download the WebEx app. We will remind you check your blood pressure, heart rate and weight prior to your scheduled appointment. If you have an Apple Watch or Kardia, please upload any pertinent ECG strips the day before or morning of your appointment to [place]. Our staff will also make sure you have reviewed the consent and agree to move forward with your scheduled tele-health visit.     THE DAY OF YOUR APPOINTMENT  Approximately 15 minutes prior to your scheduled appointment, you will receive a telephone call from one of [facility] team - your caller ID may say "Unknown caller."  Our staff will confirm medications, vital signs for the day and any symptoms you may be experiencing. Please  have this information available prior to the time of visit start. It may also be helpful for you to have a pad of paper and pen handy for any instructions given during your visit. They will also walk you through joining the WebEx smartphone meeting if this is a video visit.    CONSENT FOR TELE-HEALTH VISIT - PLEASE REVIEW  I hereby voluntarily request, consent and authorize [facility] and its employed or contracted physicians, physician assistants, nurse practitioners or other licensed health care professionals (the Practitioner), to provide me with telemedicine health care services (the "Services") as deemed  necessary by the treating Practitioner. I acknowledge and consent to receive the Services by the Practitioner via telemedicine. I understand that the telemedicine visit will involve communicating with the Practitioner through live audiovisual communication technology and the disclosure of certain medical information by electronic transmission. I acknowledge that I have been given the opportunity to request an in-person assessment or other available alternative prior to the telemedicine visit and am voluntarily participating in the telemedicine visit.  I understand that I have the right to withhold or withdraw my consent to the use of telemedicine in the course of my care at any time, without affecting my right to future care or treatment, and that the Practitioner or I may terminate the telemedicine visit at any time. I understand that I have the right to inspect all information obtained and/or recorded in the course of the telemedicine visit and may receive copies of available information for a reasonable fee.  I understand that some of the potential risks of receiving the Services via telemedicine include:  [place] Delay or interruption in medical evaluation due to technological equipment failure or disruption; . Information transmitted may not be sufficient (e.g. poor resolution of images) to allow for appropriate medical decision making by the Practitioner; and/or  . In rare instances, security protocols could fail, causing a breach of personal health information.  Furthermore, I acknowledge that it is my responsibility to provide information about my medical history, conditions and care that is complete and accurate to the best of my ability. I acknowledge that Practitioner's advice, recommendations, and/or decision may be based on factors not within their control, such as incomplete or inaccurate data provided by me or distortions of diagnostic images or specimens that may result from electronic  transmissions. I understand that the practice of medicine is not an exact science and that Practitioner makes no warranties or guarantees regarding treatment outcomes. I acknowledge that I will receive a copy of this consent concurrently upon execution via email to the email address I last provided but may also request a printed copy by calling the office of [place].    I understand that my insurance will be billed for this visit.   I have read or had this consent read to me. . I understand the contents of this consent, which adequately explains the benefits and risks of the Services being provided via telemedicine.  . I have been provided ample opportunity to ask questions regarding this consent and the Services and have had my questions answered to my satisfaction. . I give my informed consent for the services to be provided through the use of telemedicine in my medical care  By participating in this telemedicine visit I agree to the above.

## 2018-10-16 ENCOUNTER — Other Ambulatory Visit: Payer: Self-pay

## 2018-10-16 DIAGNOSIS — Z952 Presence of prosthetic heart valve: Secondary | ICD-10-CM

## 2018-10-22 ENCOUNTER — Telehealth (INDEPENDENT_AMBULATORY_CARE_PROVIDER_SITE_OTHER): Payer: PPO | Admitting: Physician Assistant

## 2018-10-22 ENCOUNTER — Other Ambulatory Visit: Payer: Self-pay

## 2018-10-22 VITALS — BP 151/76 | HR 93 | Wt 205.0 lb

## 2018-10-22 DIAGNOSIS — I10 Essential (primary) hypertension: Secondary | ICD-10-CM | POA: Diagnosis not present

## 2018-10-22 DIAGNOSIS — Z7189 Other specified counseling: Secondary | ICD-10-CM

## 2018-10-22 DIAGNOSIS — I350 Nonrheumatic aortic (valve) stenosis: Secondary | ICD-10-CM | POA: Diagnosis not present

## 2018-10-22 DIAGNOSIS — Z952 Presence of prosthetic heart valve: Secondary | ICD-10-CM

## 2018-10-22 DIAGNOSIS — I2510 Atherosclerotic heart disease of native coronary artery without angina pectoris: Secondary | ICD-10-CM | POA: Diagnosis not present

## 2018-10-22 NOTE — Progress Notes (Signed)
HEART AND VASCULAR [facility]   MULTIDISCIPLINARY HEART VALVE TEAM     Virtual Visit via Video Note   This visit type was conducted due to national recommendations for restrictions regarding the COVID-19 Pandemic (e.g. social distancing) in an effort to limit this patient's exposure and mitigate transmission in our community.  Due to his co-morbid illnesses, this patient is at least at moderate risk for complications without adequate follow up.  This format is felt to be most appropriate for this patient at this time.  All issues noted in this document were discussed and addressed.  A limited physical exam was performed with this format.  Please refer to the patient's chart for his consent to telehealth for [facility].   Evaluation Performed:  Follow-up visit  Date:  10/22/2018   ID:  [name], DOB 1936-06-13, MRN [id number]  Patient Location: Home Provider Location: Home  PCP:  Chad Crouch, MD  Cardiologist: Dr. Ubaldo Glassing Dr. Burt Knack & Dr. Cyndia Bent (TAVR)  Chief Complaint:  1 year s/p TAVR  History of Present Illness:    Chad Rollins is a 83 y.o. male with rheumatoid arthritis, HTN, DMT2, CAD s/p DES to RCA on 11/06/2017, peripheral neuropathy, prior stroke with significant visual impairment and severe aortic stenosiss/p TAVR (12/05/17) who presents for telehealth visit.   The patient does not have symptoms concerning for COVID-19 infection (fever, chills, cough, or new shortness of breath).   He was diagnosed with severe aortic stenosis last year. R/LHC 11/06/17 showed severe 2V CAD with severe stenosis of a large, dominant RCA and severe stenosis of a small OM branch as well as severe AS with a mean gradient of 54 mm Hg and AVA 0.83. He underwentsuccessful PCI/DES x1 to the mRCAand placed on ASA and plavix.  He underwent successfulTAVR with a 26 mm EdwardsSapien THVvia the TF approach on 12/05/2017.Postoperative echoshowed normally functioningaortic valve prosthesis with  noPVL, mean gradient 7 mm Hg.He had a new LBBB and transient bradycardia postoperatively and home metoprolol was discontinued. He was continue on DAPT with  ASA and Plavix. 1 month echo showed EF 55-60%, normally functioning TAVR with mean gradient 11 mm Hg and no PVL.   Today he presents for telehealth visit. His daughter, [name], is here for the visit. He is doing well. Staying at home. His caregiver takes him on the walks to the mail box, which sometimes causes him some fatigue. He is overall very sedentary.  No CP or SOB. No LE edema, orthopnea or PND. No dizziness or syncope. No blood in stool or urine. He sometimes has some discomfort when laying in certain positions at night.    Past Medical History:  Diagnosis Date  . BPH with obstruction/lower urinary tract symptoms   . CAD (coronary artery disease)    a. 11/2017 PCI: LM nl, LAD mild diff dzs, LCX, mild diff dzs, OM1 small, 99ost, RCA 90p/m (3.5x18 Synergy DES).  . Continuous right lower quadrant pain   . Diabetes mellitus ([place])   . HLD (hyperlipidemia)   . HTN (hypertension)   . Hypothyroidism   . Injury of right rotator cuff   . LBBB (left bundle branch block)    a. developed post-TAVR 12/2017.  . Obesity   . Osteoarthritis   . Peripheral neuropathy   . Rheumatoid arthritis ([place])   . Severe aortic stenosis    a. 12/2017 s/p TAVR w/ Edwards Sapien 3 (size 55m, model #9600TFX, [phone]; b. 12/2017 Echo: nl fxning AoV w/ no PVL.  Mean gradient 21mHg.  . Stroke (Chad [facility]    Past Surgical History:  Procedure Laterality Date  . CHOLECYSTECTOMY    . CORONARY STENT INTERVENTION N/A 11/06/2017   Procedure: CORONARY STENT INTERVENTION;  Surgeon: CSherren Mocha MD;  [facility];  Service: Cardiovascular;  Laterality: N/A;  . INTRAOPERATIVE TRANSTHORACIC ECHOCARDIOGRAM N/A 12/05/2017   Procedure: INTRAOPERATIVE TRANSTHORACIC ECHOCARDIOGRAM;  Surgeon: CSherren Mocha MD;  [place] [facility]  Service: Open Heart Surgery;   Laterality: N/A;  . Lipoma excisions    . RIGHT/LEFT HEART CATH AND CORONARY ANGIOGRAPHY N/A 11/06/2017   Procedure: RIGHT/LEFT HEART CATH AND CORONARY ANGIOGRAPHY;  Surgeon: CSherren Mocha MD;  [facility];  Service: Cardiovascular;  Laterality: N/A;  . Surgery for sleep apnea    . TONSILLECTOMY    . TRANSCATHETER AORTIC VALVE REPLACEMENT, TRANSFEMORAL N/A 12/05/2017   Procedure: TRANSCATHETER AORTIC VALVE REPLACEMENT, TRANSFEMORAL;  Surgeon: CSherren Mocha MD;  [place] [facility]  Service: Open Heart Surgery;  Laterality: N/A;     Current Meds  Medication Sig  . amoxicillin (AMOXIL) 500 MG tablet Take 4 tablets (2,000 mg) 1 hour prior to dental visits.  .Marland Kitchenaspirin EC 81 MG tablet Take 81 mg by mouth daily.  .Marland Kitchenatorvastatin (LIPITOR) 20 MG tablet Take 20 mg by mouth at bedtime.   . brimonidine (ALPHAGAN) 0.2 % ophthalmic solution Place 1 drop into both eyes 2 (two) times daily.   . busPIRone (BUSPAR) 15 MG tablet Take 15 mg by mouth 2 (two) times daily.   . Cholecalciferol (VITAMIN D3) 2000 units capsule Take 2,000 Units by mouth 2 (two) times daily.   . dorzolamide-timolol (COSOPT) 22.3-6.8 MG/ML ophthalmic solution Place 1 drop into both eyes 2 (two) times daily.  . finasteride (PROSCAR) 5 MG tablet Take 1 tablet (5 mg total) by mouth daily.  .Marland KitchenglipiZIDE (GLUCOTROL) 5 MG tablet Take 5 mg by mouth 2 (two) times daily before a meal.   . hydrochlorothiazide (HYDRODIURIL) 25 MG tablet Take 0.5 tablets (12.5 mg total) by mouth daily.  .Marland Kitchenlatanoprost (XALATAN) 0.005 % ophthalmic solution Place 1 drop into both eyes at bedtime.   .Marland Kitchenlevothyroxine (SYNTHROID, LEVOTHROID) 75 MCG tablet Take 75 mcg by mouth daily before breakfast.  . losartan (COZAAR) 50 MG tablet Take 50 mg by mouth 2 (two) times daily.   . Melatonin 5 MG TABS Take 10 mg by mouth at bedtime.   . metFORMIN (GLUCOPHAGE) 1000 MG tablet Take 1,000 mg by mouth 2 (two) times daily with a meal.  . Multiple Vitamins-Minerals  (PRESERVISION AREDS 2) CAPS Take 1 capsule by mouth 2 (two) times daily.  . nitroGLYCERIN (NITROSTAT) 0.4 MG SL tablet Place 1 tablet (0.4 mg total) under the tongue every 5 (five) minutes as needed.  . pantoprazole (PROTONIX) 40 MG tablet Take 1 tablet (40 mg total) by mouth daily.  . tamsulosin (FLOMAX) 0.4 MG CAPS capsule Take 1 capsule (0.4 mg total) by mouth daily after supper.  . vitamin B-12 (CYANOCOBALAMIN) 1000 MCG tablet Take 1,000 mcg by mouth at bedtime.  . [DISCONTINUED] clopidogrel (PLAVIX) 75 MG tablet Take 75 mg by mouth at bedtime.      Allergies:   Acetaminophen   Social History   Tobacco Use  . Smoking status: Former Smoker    Last attempt to quit: 10/14/1973    Years since quitting: 45.0  . Smokeless tobacco: Former [employer] developer . Tobacco comment: quit 1975  Substance Use Topics  . Alcohol use: No  Alcohol/week: 0.0 standard drinks  . Drug use: No     Family Hx: The patient's family history includes Diabetes in his mother; Hypertension in his mother. There is no history of Prostate cancer, Bladder Cancer, or Kidney disease.  ROS:   Please see the history of present illness.    All other systems reviewed and are negative.   Prior CV studies:   The following studies were reviewed today: TAVR OPERATIVE NOTE   Date of Procedure:12/05/2017  Preoperative Diagnosis:Severe Aortic Stenosis  Procedure:   Transcatheter Aortic Valve Replacement - Percutaneous Transfemoral Approach Edwards Sapien 3 THV (size 64m, model # 9600TFX, serial # [id number]  Co-Surgeons:Bryan KAlveria Apley MD and MSherren Mocha MD   Pre-operative Echo Findings: severe aortic stenosis ? normalleft ventricular systolic function  Post-operative Echo Findings: ? noparavalvular leak ? normalleft ventricular systolic function  ________________  Post operative echo 12/06/17 Study Conclusions - Procedure  narrative: Transthoracic echocardiography. Image quality was poor. - Left ventricle: The cavity size was normal. Wall thickness was increased in a pattern of moderate LVH. Systolic function was vigorous. The estimated ejection fraction was in the range of 65% to 70%. - Aortic valve: s/p Edwards Sapien 3 THV (26 mm) - no obvious paravalvular leak or obstruction. Mean gradient (S): 7 mm Hg. Peak gradient (S): 14 mm Hg. Valve area (VTI): 1.89 cm^2. Valve area (Vmax): 1.54 cm^2. Valve area (Vmean): 1.65 cm^2. - Pericardium, extracardiac: There was no pericardial effusion. Impressions: - Technically difficult study. Compared to a prior study on 12/05/2017, there has been interval placement of an Edwards Sapien 3 THV (26 mm) without paravalvular leak or obstruction.  _________________   2D ECHO 01/22/18 (1 month s/p TAVR) Study Conclusions - Left ventricle: The cavity size was normal. Wall thickness was increased in a pattern of mild LVH. Systolic function was normal. The estimated ejection fraction was in the range of 55% to 60%. Incoordinate septal motion. Doppler parameters are consistent with abnormal left ventricular relaxation (grade 1 diastolic dysfunction). The E/e&' ratio is >15, suggesting elevated LV filling pressure. - Aortic valve: s/p Sapien 3 TAVR. No obstruction or paravalvular leak. Mean gradient (S): 11 mm Hg. Peak gradient (S): 22 mm Hg. Valve area (VTI): 1.11 cm^2. Valve area (Vmax): 0.9 cm^2. - Mitral valve: Mildly thickened leaflets . MAC. Mild MR. - Left atrium: Moderately dilated. - Inferior vena cava: The vessel was normal in size. The respirophasic diameter changes were in the normal range (>= 50%), consistent with normal central venous pressure. Impressions - Compared to a prior study in 12/2017, the LVEF is higher at 55-60%. The TAVR valve is stable without paravalvular leak.   Labs/Other Tests and Data Reviewed:     EKG:  No ECG reviewed.  Recent Labs: 12/04/2017: B Natriuretic Peptide 149.8 12/06/2017: Magnesium 1.6 12/14/2017: ALT 19 12/15/2017: BUN 24; Creatinine, Ser 1.24; Hemoglobin 12.0; Platelets 145; Potassium 4.1; Sodium 141   Recent Lipid Panel Lab Results  Component Value Date/Time   CHOL 99 09/18/2015 04:44 AM   CHOL 148 08/08/2014 05:22 AM   TRIG 128 09/18/2015 04:44 AM   TRIG 195 08/08/2014 05:22 AM   HDL 28 (L) 09/18/2015 04:44 AM   HDL 28 (L) 08/08/2014 05:22 AM   CHOLHDL 3.5 09/18/2015 04:44 AM   LDLCALC 45 09/18/2015 04:44 AM   LDLCALC 81 08/08/2014 05:22 AM    Wt Readings from Last 3 Encounters:  10/22/18 205 lb (93 kg)  01/25/18 206 lb (93.4 kg)  01/22/18 205 lb (93 kg)  Objective:    Vital Signs:  BP (!) 151/76   Pulse 93   Wt 205 lb (93 kg)   BMI 28.59 kg/m    Well nourished, well developed male in no acute distress. No LE edema. Hard of hearing.    ASSESSMENT & PLAN:    Severe AS s/p TAVR: doing well. He has NYHA class I symptoms, but he is overall very sedentary. KCCQ completed over the phone. Continue on aspirin 61m daily. He has amoxicillin for SBE prophylaxis. Echo has been pushed out until august given Covid 19 pandemic.   HTN: BP has been elevated. Reviewed home logs with daughter kim and SBP consistently running in 150s this past week but 120s and 130s the week before. Continue Losartan 570mBID and HCTZ 12.11m51maily. Plan to watch BP over the next week. They will call me with his log. If it remains elevated I will add Norvasc 11mg3mily.   CAD: s/p to DES to RCA 11/06/17. Continue aspirin 81 mg, atorva 20mg53mly.   COVID-19 Education: the signs and symptoms of COVID-19 were discussed with the patient and how to seek care for testing (follow up with PCP or arrange E-visit).  The importance of social distancing was discussed today.  Time:   Today, I have spent 25 minutes with the patient with telehealth technology discussing the above problems.      Medication Adjustments/Labs and Tests Ordered: Current medicines are reviewed at length with the patient today.  Concerns regarding medicines are outlined above.   Tests Ordered: No orders of the defined types were placed in this encounter.   Medication Changes: No orders of the defined types were placed in this encounter.   Disposition:  Follow up with Dr. Fath Ubaldo Glassingegularly scheduled  Signed, KathrAngelena FormC  10/22/2018 12:26 PM    [facility]

## 2018-10-22 NOTE — Patient Instructions (Signed)
Hello Mr. Venne,   It was so nice to talk to you on the audio/video chat today. I am so glad you are doing so well. I just wanted to send you a recap of our discussion.   Please continue taking antibiotics prior to any dental work including cleanings (amoxicillin). Please continue on all your same medications. Please keep a log of you blood pressures and call [phone] with your log next week. If BP is elevated we can add a new medication.   Your 1 year echo has been rescheduled. Please continue regular follow up with you primary cardiologist, Dr. Ubaldo Glassing.   All appointment details are listed in upcoming appointments in MyChart or attached to this letter in your "after visit summary."  Please call us with any questions or concerns you may have and please stay safe during these uncertain times.  Nell Range

## 2018-11-20 ENCOUNTER — Other Ambulatory Visit: Payer: Self-pay | Admitting: *Deleted

## 2018-11-20 NOTE — Patient Outreach (Signed)
[facility]) Care Management  11/20/2018  Chad Rollins 1935-10-14 [id number]   RN Health Coach Case closure. Patient is in external [facility] program.  Plan: Case closure letter sent to PCP and patient.  [place] Care Management [phone]

## 2018-12-11 ENCOUNTER — Ambulatory Visit: Payer: Self-pay | Admitting: *Deleted

## 2018-12-24 DIAGNOSIS — H401133 Primary open-angle glaucoma, bilateral, severe stage: Secondary | ICD-10-CM | POA: Diagnosis not present

## 2018-12-26 ENCOUNTER — Encounter: Payer: Self-pay | Admitting: Thoracic Surgery (Cardiothoracic Vascular Surgery)

## 2019-01-16 DIAGNOSIS — I10 Essential (primary) hypertension: Secondary | ICD-10-CM | POA: Diagnosis not present

## 2019-01-28 NOTE — Progress Notes (Signed)
01/29/2019 11:55 AM   Chad Rollins 07-Apr-1936 [id number]  Referring provider: Idelle Crouch, MD [facility] [place],  [place] [address]  Chief Complaint  Patient presents with  . Benign Prostatic Hypertrophy    HPI: Chad Rollins is a 83 year male who has a BPH with LUTS and a history of nephrolithiasis who presents for a 12 months follow up with caregiver, Butch Penny.    BPH with LU TS (prostate and/or bladder) His IPSS score is 11, which is moderate lower urinary tract symptomatology.  He is mixed with his quality life due to his urinary symptoms.   His previous I PSS score was 3/0.  His major complaints today are nocturia.  Speaking with his caregiver, his DM is not controlled as he frequently has home blood sugars of greater than 200.  He is also up through the night eating.  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  He has not had any recent fevers, chills, nausea or vomiting. He also denies any dysuria, gross hematuria or suprapubic pain. IPSS    Row Name 01/29/19 1100         International Prostate Symptom Score   How often have you had the sensation of not emptying your bladder?  Not at All     How often have you had to urinate less than every two hours?  About half the time     How often have you found you stopped and started again several times when you urinated?  Less than 1 in 5 times     How often have you found it difficult to postpone urination?  More than half the time     How often have you had a weak urinary stream?  Not at All     How often have you had to strain to start urination?  Not at All     How many times did you typically get up at night to urinate?  3 Times     Total IPSS Score  11       Quality of Life due to urinary symptoms   If you were to spend the rest of your life with your urinary condition just the way it is  now how would you feel about that?  Mixed        Score:  1-7 Mild 8-19 Moderate 20-35 Severe  History of nephrolithiasis Patient has not had any flank pain or gross hematuria. He has not had any passage of stones.  PMH: Past Medical History:  Diagnosis Date  . BPH with obstruction/lower urinary tract symptoms   . CAD (coronary artery disease)    a. 11/2017 PCI: LM nl, LAD mild diff dzs, LCX, mild diff dzs, OM1 small, 99ost, RCA 90p/m (3.5x18 Synergy DES).  . Continuous right lower quadrant pain   . Diabetes mellitus ([place])   . HLD (hyperlipidemia)   . HTN (hypertension)   . Hypothyroidism   . Injury of right rotator cuff   . LBBB (left bundle branch block)    a. developed post-TAVR 12/2017.  . Obesity   . Osteoarthritis   . Peripheral neuropathy   . Rheumatoid arthritis ([place])   . Severe aortic stenosis    a. 12/2017 s/p TAVR w/ Edwards Sapien 3 (size 38mm, model #9600TFX, [phone]); b. 12/2017 Echo: nl  fxning AoV w/ no PVL. Mean gradient 29mmHg.  . Stroke [facility])     Surgical History: Past Surgical History:  Procedure Laterality Date  . CHOLECYSTECTOMY    . CORONARY STENT INTERVENTION N/A 11/06/2017   Procedure: CORONARY STENT INTERVENTION;  Surgeon: Sherren Mocha, MD;  Location: [place] [facility] LAB;  Service: Cardiovascular;  Laterality: N/A;  . INTRAOPERATIVE TRANSTHORACIC ECHOCARDIOGRAM N/A 12/05/2017   Procedure: INTRAOPERATIVE TRANSTHORACIC ECHOCARDIOGRAM;  Surgeon: Sherren Mocha, MD;  Location: [place];  Service: Open Heart Surgery;  Laterality: N/A;  . Lipoma excisions    . RIGHT/LEFT HEART CATH AND CORONARY ANGIOGRAPHY N/A 11/06/2017   Procedure: RIGHT/LEFT HEART CATH AND CORONARY ANGIOGRAPHY;  Surgeon: Sherren Mocha, MD;  Location: [place] CV LAB;  Service: Cardiovascular;  Laterality: N/A;  . Surgery for sleep apnea    . TONSILLECTOMY    . TRANSCATHETER AORTIC VALVE REPLACEMENT, TRANSFEMORAL N/A 12/05/2017   Procedure: TRANSCATHETER AORTIC VALVE REPLACEMENT,  TRANSFEMORAL;  Surgeon: Sherren Mocha, MD;  Location: [place];  Service: Open Heart Surgery;  Laterality: N/A;    Home Medications:  Allergies as of 01/29/2019      Reactions   Acetaminophen Other (See Comments)   Long period of time, effects kidney      Medication List       Accurate as of January 29, 2019 11:55 AM. If you have any questions, ask your nurse or doctor.        amoxicillin 500 MG tablet Commonly known as: AMOXIL Take 4 tablets (2,000 mg) 1 hour prior to dental visits.   aspirin EC 81 MG tablet Take 81 mg by mouth daily.   atorvastatin 20 MG tablet Commonly known as: LIPITOR Take 20 mg by mouth at bedtime.   brimonidine 0.2 % ophthalmic solution Commonly known as: ALPHAGAN Place 1 drop into both eyes 2 (two) times daily.   busPIRone 15 MG tablet Commonly known as: BUSPAR Take 15 mg by mouth 2 (two) times daily.   clopidogrel 75 MG tablet Commonly known as: PLAVIX Take by mouth.   dorzolamide-timolol 22.3-6.8 MG/ML ophthalmic solution Commonly known as: COSOPT Place 1 drop into both eyes 2 (two) times daily.   finasteride 5 MG tablet Commonly known as: PROSCAR Take 1 tablet (5 mg total) by mouth daily.   glipiZIDE 5 MG tablet Commonly known as: GLUCOTROL Take 5 mg by mouth 2 (two) times daily before a meal.   hydrochlorothiazide 25 MG tablet Commonly known as: HYDRODIURIL Take 0.5 tablets (12.5 mg total) by mouth daily.   latanoprost 0.005 % ophthalmic solution Commonly known as: XALATAN Place 1 drop into both eyes at bedtime.   levothyroxine 75 MCG tablet Commonly known as: SYNTHROID Take 75 mcg by mouth daily before breakfast.   losartan 50 MG tablet Commonly known as: COZAAR Take 50 mg by mouth 2 (two) times daily.   Melatonin 5 MG Tabs Take 10 mg by mouth at bedtime.   metFORMIN 1000 MG tablet Commonly known as: GLUCOPHAGE Take 1,000 mg by mouth 2 (two) times daily with a meal.   nitroGLYCERIN 0.4 MG SL tablet Commonly known as:  Nitrostat Place 1 tablet (0.4 mg total) under the tongue every 5 (five) minutes as needed.   OneTouch Ultra test strip Generic drug: glucose blood Use 3 (three) times daily Use as instructed.   pantoprazole 40 MG tablet Commonly known as: PROTONIX Take 1 tablet (40 mg total) by mouth daily.   PreserVision AREDS 2 Caps Take 1 capsule by mouth 2 (two) times  daily.   Refresh Tears 0.5 % Soln Generic drug: carboxymethylcellulose Apply to eye.   tamsulosin 0.4 MG Caps capsule Commonly known as: FLOMAX Take 1 capsule (0.4 mg total) by mouth daily after supper.   vitamin B-12 1000 MCG tablet Commonly known as: CYANOCOBALAMIN Take 1,000 mcg by mouth at bedtime.   Vitamin D3 50 MCG (2000 UT) capsule Take 2,000 Units by mouth 2 (two) times daily.       Allergies:  Allergies  Allergen Reactions  . Acetaminophen Other (See Comments)    Long period of time, effects kidney    Family History: Family History  Problem Relation Age of Onset  . Diabetes Mother   . Hypertension Mother   . Prostate cancer Neg Hx   . Bladder Cancer Neg Hx   . Kidney disease Neg Hx     Social History:  reports that he quit smoking about 45 years ago. He has quit using smokeless tobacco. He reports that he does not drink alcohol or use drugs.  ROS: UROLOGY Frequent Urination?: No Hard to postpone urination?: No Burning/pain with urination?: No Get up at night to urinate?: No Leakage of urine?: No Urine stream starts and stops?: No Trouble starting stream?: No Do you have to strain to urinate?: No Blood in urine?: No Urinary tract infection?: No Sexually transmitted disease?: No Injury to kidneys or bladder?: No Painful intercourse?: No Weak stream?: No Erection problems?: No Penile pain?: No  Gastrointestinal Nausea?: No Vomiting?: No Indigestion/heartburn?: No Diarrhea?: No Constipation?: No  Constitutional Fever: No Night sweats?: No Weight loss?: No Fatigue?: No  Skin  Skin rash/lesions?: No Itching?: No  Eyes Blurred vision?: No Double vision?: No  Ears/Nose/Throat Sore throat?: No Sinus problems?: No  Hematologic/Lymphatic Swollen glands?: No Easy bruising?: No  Cardiovascular Leg swelling?: No Chest pain?: No  Respiratory Cough?: No Shortness of breath?: No  Endocrine Excessive thirst?: No  Musculoskeletal Back pain?: No Joint pain?: No  Neurological Headaches?: No Dizziness?: No  Psychologic Depression?: No Anxiety?: No  Physical Exam: BP (!) 152/84 (BP Location: Left Arm, Patient Position: Sitting, Cuff Size: Normal)   Pulse 80   Ht 5\' 11"  (1.803 m)   Wt 214 lb (97.1 kg)   BMI 29.85 kg/m   Constitutional:  Well nourished. Alert and oriented, No acute distress. HEENT: [place] AT, moist mucus membranes.  Trachea midline, no masses. Cardiovascular: No clubbing, cyanosis, or edema. Respiratory: Normal respiratory effort, no increased work of breathing. GI: Abdomen is soft, non tender, non distended, no abdominal masses. Liver and spleen not palpable.  No hernias appreciated.  Stool sample for occult testing is not indicated.   GU: No CVA tenderness.  No bladder fullness or masses.  Patient with circumcised phallus.   Urethral meatus is patent.  No penile discharge. No penile lesions or rashes. Scrotum without lesions, cysts, rashes and/or edema.  Testicles are located scrotally bilaterally. No masses are appreciated in the testicles. Left and right epididymis are normal. Rectal: Patient with  normal sphincter tone. Anus and perineum without scarring or rashes. No rectal masses are appreciated. Prostate is approximately 45 grams, no nodules are appreciated. Seminal vesicles are normal. Skin: No rashes, bruises or suspicious lesions. Lymph: No  inguinal adenopathy. Neurologic: Grossly intact, no focal deficits, moving all 4 extremities. Psychiatric: Normal mood and affect.   Laboratory Data:  PSA of 0.7 on 07/23/2014.   Results for orders placed or performed in visit on 01/29/19  Bladder Scan (Post Void Residual) in office  Result Value Ref Range   Scan Result 10    Lab Results  Component Value Date   WBC 14.4 (H) 12/15/2017   HGB 12.0 (L) 12/15/2017   HCT 36.9 (L) 12/15/2017   MCV 94.4 12/15/2017   PLT 145 (L) 12/15/2017    Lab Results  Component Value Date   CREATININE 1.24 12/15/2017     Lab Results  Component Value Date   HGBA1C 5.9 (H) 12/04/2017   I have reviewed the labs   Assessment & Plan:    1. BPH (benign prostatic hyperplasia) with LUTS:   IPSS 3/0.  He will continue the finasteride and tamsulosin.   He will follow-up in one year's time for IPSS score and PVR.  2. History of nephrolithiasis:   Right lower ureteral stone passed spontaneously on 12/2015.  3. Nocturia Encouraged caregiver to have patient undergo a sleep study as sleep apnea may be contributing to his dementia as well as his night time urination and eating   Return in about 1 year (around 01/29/2020) for IPSS, PVR and exam.  Zara Council, [facility]  [place], [place] [address] [phone]

## 2019-01-29 ENCOUNTER — Ambulatory Visit (INDEPENDENT_AMBULATORY_CARE_PROVIDER_SITE_OTHER): Payer: PPO | Admitting: Urology

## 2019-01-29 ENCOUNTER — Other Ambulatory Visit: Payer: Self-pay

## 2019-01-29 ENCOUNTER — Encounter: Payer: Self-pay | Admitting: Urology

## 2019-01-29 VITALS — BP 152/84 | HR 80 | Ht 71.0 in | Wt 214.0 lb

## 2019-01-29 DIAGNOSIS — N401 Enlarged prostate with lower urinary tract symptoms: Secondary | ICD-10-CM | POA: Diagnosis not present

## 2019-01-29 DIAGNOSIS — R351 Nocturia: Secondary | ICD-10-CM | POA: Diagnosis not present

## 2019-01-29 DIAGNOSIS — Z87442 Personal history of urinary calculi: Secondary | ICD-10-CM

## 2019-01-29 DIAGNOSIS — N138 Other obstructive and reflux uropathy: Secondary | ICD-10-CM | POA: Diagnosis not present

## 2019-01-29 LAB — BLADDER SCAN AMB NON-IMAGING: Scan Result: 10

## 2019-02-04 ENCOUNTER — Other Ambulatory Visit (HOSPITAL_COMMUNITY): Payer: PPO

## 2019-02-25 DIAGNOSIS — Z7689 Persons encountering health services in other specified circumstances: Secondary | ICD-10-CM | POA: Diagnosis not present

## 2019-02-28 DIAGNOSIS — I10 Essential (primary) hypertension: Secondary | ICD-10-CM | POA: Diagnosis not present

## 2019-02-28 DIAGNOSIS — E039 Hypothyroidism, unspecified: Secondary | ICD-10-CM | POA: Diagnosis not present

## 2019-02-28 DIAGNOSIS — F5104 Psychophysiologic insomnia: Secondary | ICD-10-CM | POA: Diagnosis not present

## 2019-02-28 DIAGNOSIS — Z0000 Encounter for general adult medical examination without abnormal findings: Secondary | ICD-10-CM | POA: Diagnosis not present

## 2019-02-28 DIAGNOSIS — Z79899 Other long term (current) drug therapy: Secondary | ICD-10-CM | POA: Diagnosis not present

## 2019-02-28 DIAGNOSIS — E782 Mixed hyperlipidemia: Secondary | ICD-10-CM | POA: Diagnosis not present

## 2019-02-28 DIAGNOSIS — E139 Other specified diabetes mellitus without complications: Secondary | ICD-10-CM | POA: Diagnosis not present

## 2019-05-16 IMAGING — CR DG CHEST 2V
2 series · 2 of 2 positions shown · non-contrast
Comparison: 09/17/2015

CLINICAL DATA: Preoperative evaluation for AVR

EXAM:
CHEST - 2 VIEW

[w chest lat]
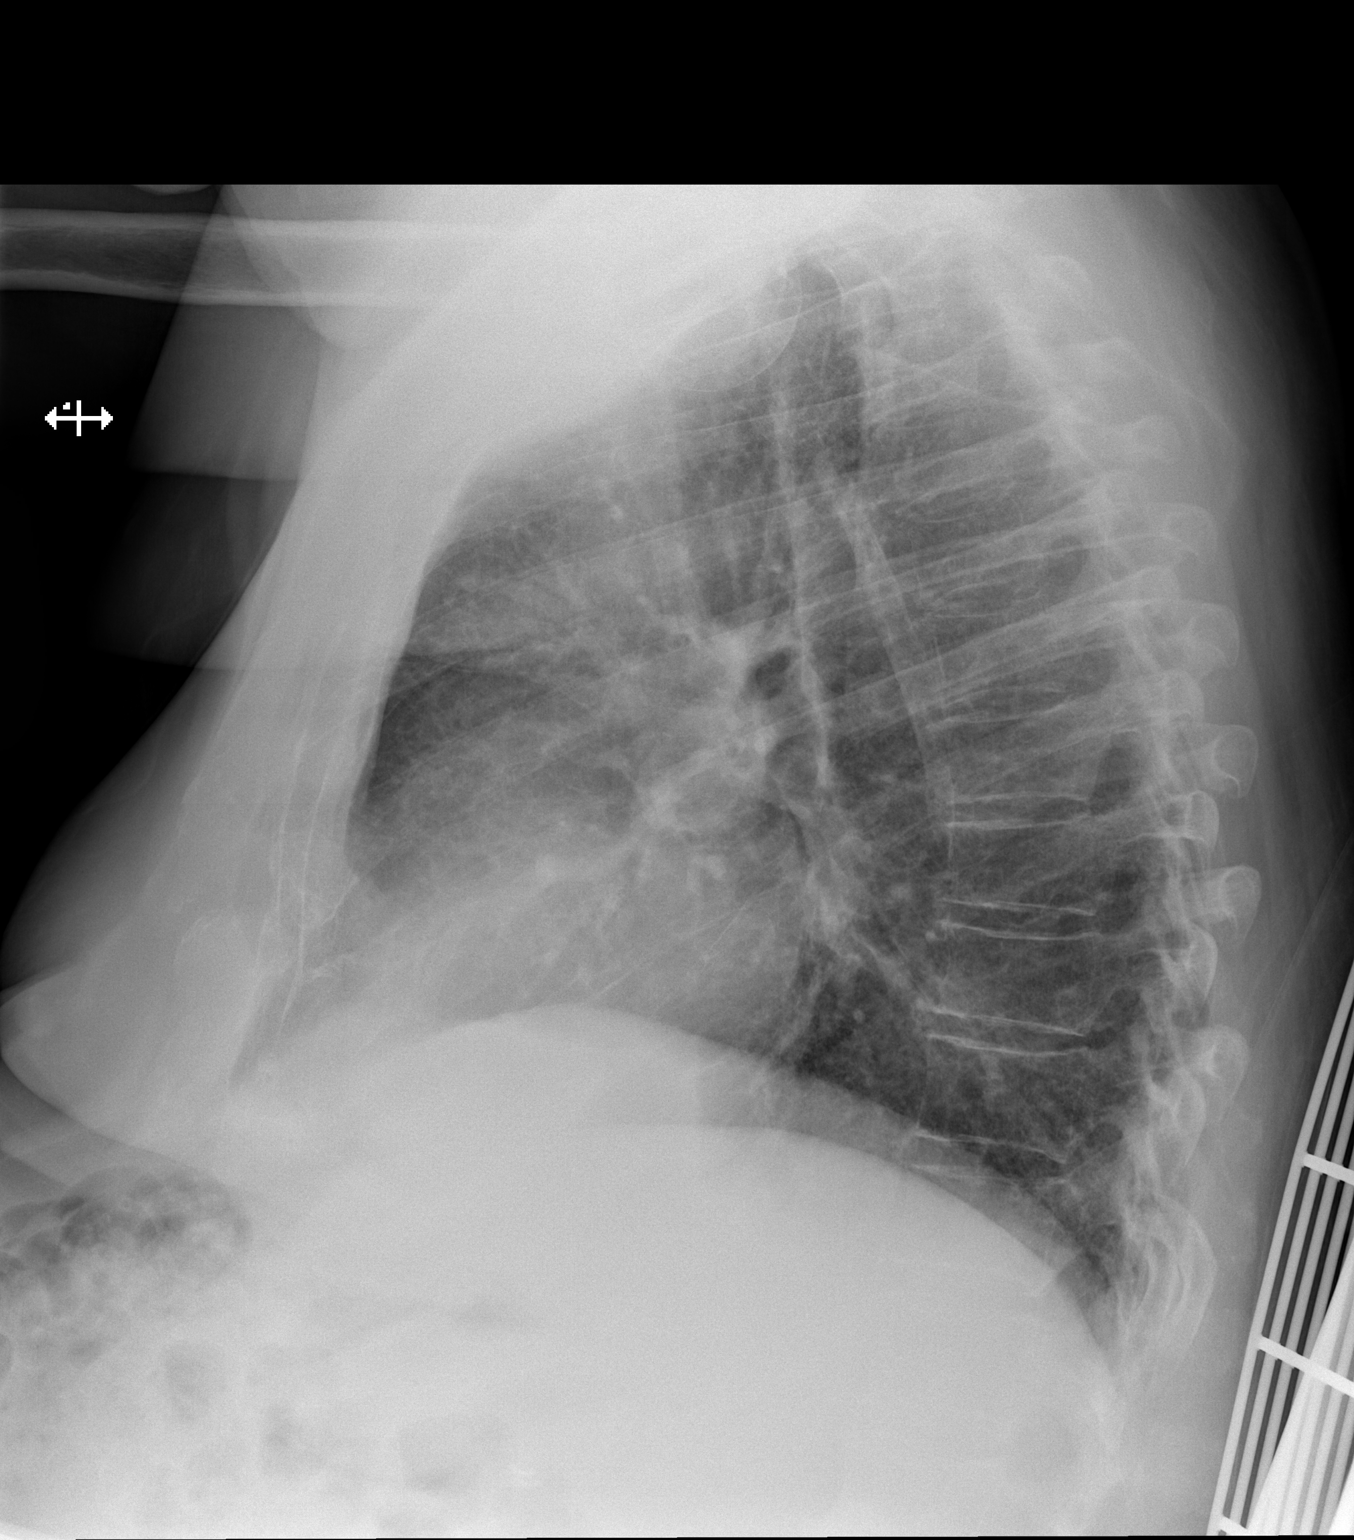

[x chest ap]
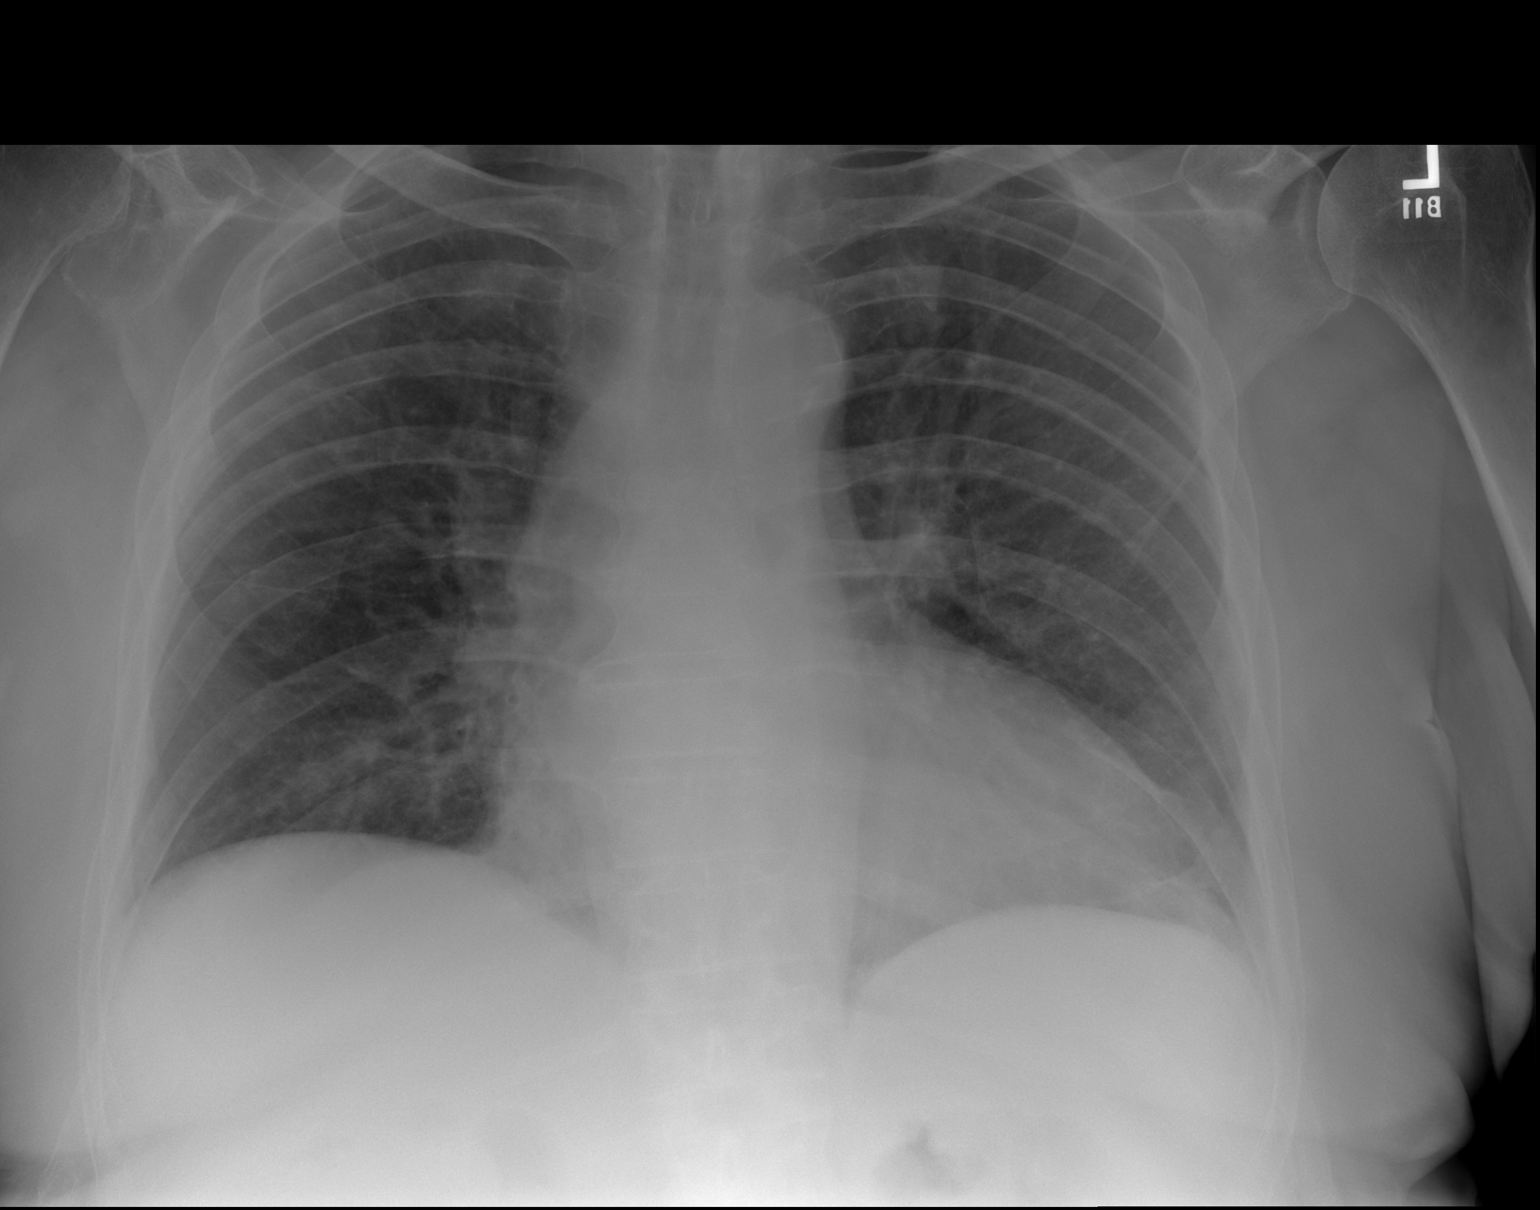

[2 of 2 positions shown; findings below may reference images not displayed]

FINDINGS: Enlargement of cardiac silhouette.

Mediastinal contours and pulmonary vascularity normal.

Minimal RIGHT basilar atelectasis.

Lungs otherwise clear.

No pulmonary infiltrate, pleural effusion or pneumothorax.

Minimal atherosclerotic calcification aortic arch.

Bones demineralized with evidence of a chronic RIGHT rotator cuff
tear.
IMPRESSION: Minimal RIGHT basilar atelectasis.

Enlargement of cardiac silhouette.

## 2019-06-03 ENCOUNTER — Other Ambulatory Visit (HOSPITAL_COMMUNITY): Payer: PPO

## 2019-06-05 DIAGNOSIS — E039 Hypothyroidism, unspecified: Secondary | ICD-10-CM | POA: Diagnosis not present

## 2019-06-05 DIAGNOSIS — I10 Essential (primary) hypertension: Secondary | ICD-10-CM | POA: Diagnosis not present

## 2019-06-05 DIAGNOSIS — E782 Mixed hyperlipidemia: Secondary | ICD-10-CM | POA: Diagnosis not present

## 2019-06-05 DIAGNOSIS — E1142 Type 2 diabetes mellitus with diabetic polyneuropathy: Secondary | ICD-10-CM | POA: Diagnosis not present

## 2019-06-05 DIAGNOSIS — Z79899 Other long term (current) drug therapy: Secondary | ICD-10-CM | POA: Diagnosis not present

## 2019-06-05 DIAGNOSIS — I2510 Atherosclerotic heart disease of native coronary artery without angina pectoris: Secondary | ICD-10-CM | POA: Diagnosis not present

## 2019-06-20 ENCOUNTER — Other Ambulatory Visit: Payer: Self-pay | Admitting: *Deleted

## 2019-06-20 DIAGNOSIS — N138 Other obstructive and reflux uropathy: Secondary | ICD-10-CM

## 2019-06-20 MED ORDER — FINASTERIDE 5 MG PO TABS: 5.0000 mg | ORAL_TABLET | Freq: Every day | ORAL | 12 refills | Status: AC

## 2019-08-26 DIAGNOSIS — I2510 Atherosclerotic heart disease of native coronary artery without angina pectoris: Secondary | ICD-10-CM | POA: Diagnosis not present

## 2019-09-02 ENCOUNTER — Other Ambulatory Visit (HOSPITAL_COMMUNITY): Payer: PPO

## 2019-09-09 DIAGNOSIS — I2510 Atherosclerotic heart disease of native coronary artery without angina pectoris: Secondary | ICD-10-CM | POA: Diagnosis not present

## 2019-09-09 DIAGNOSIS — I10 Essential (primary) hypertension: Secondary | ICD-10-CM | POA: Diagnosis not present

## 2019-09-09 DIAGNOSIS — Z79899 Other long term (current) drug therapy: Secondary | ICD-10-CM | POA: Diagnosis not present

## 2019-09-09 DIAGNOSIS — E039 Hypothyroidism, unspecified: Secondary | ICD-10-CM | POA: Diagnosis not present

## 2019-09-09 DIAGNOSIS — E782 Mixed hyperlipidemia: Secondary | ICD-10-CM | POA: Diagnosis not present

## 2019-09-09 DIAGNOSIS — E1142 Type 2 diabetes mellitus with diabetic polyneuropathy: Secondary | ICD-10-CM | POA: Diagnosis not present

## 2019-09-10 DIAGNOSIS — E1142 Type 2 diabetes mellitus with diabetic polyneuropathy: Secondary | ICD-10-CM | POA: Diagnosis not present

## 2019-09-10 DIAGNOSIS — I2510 Atherosclerotic heart disease of native coronary artery without angina pectoris: Secondary | ICD-10-CM | POA: Diagnosis not present

## 2019-09-10 DIAGNOSIS — I10 Essential (primary) hypertension: Secondary | ICD-10-CM | POA: Diagnosis not present

## 2019-09-10 DIAGNOSIS — E039 Hypothyroidism, unspecified: Secondary | ICD-10-CM | POA: Diagnosis not present

## 2019-09-10 DIAGNOSIS — Z79899 Other long term (current) drug therapy: Secondary | ICD-10-CM | POA: Diagnosis not present

## 2019-09-10 DIAGNOSIS — E782 Mixed hyperlipidemia: Secondary | ICD-10-CM | POA: Diagnosis not present

## 2019-09-16 DIAGNOSIS — Z952 Presence of prosthetic heart valve: Secondary | ICD-10-CM | POA: Diagnosis not present

## 2019-09-16 DIAGNOSIS — I350 Nonrheumatic aortic (valve) stenosis: Secondary | ICD-10-CM | POA: Diagnosis not present

## 2019-09-16 DIAGNOSIS — I2510 Atherosclerotic heart disease of native coronary artery without angina pectoris: Secondary | ICD-10-CM | POA: Diagnosis not present

## 2019-09-27 DIAGNOSIS — L89152 Pressure ulcer of sacral region, stage 2: Secondary | ICD-10-CM | POA: Diagnosis not present

## 2019-11-04 DIAGNOSIS — Z13228 Encounter for screening for other metabolic disorders: Secondary | ICD-10-CM | POA: Diagnosis not present

## 2019-11-04 DIAGNOSIS — R6889 Other general symptoms and signs: Secondary | ICD-10-CM | POA: Diagnosis not present

## 2019-12-09 ENCOUNTER — Other Ambulatory Visit: Payer: Self-pay | Admitting: Internal Medicine

## 2019-12-09 DIAGNOSIS — E039 Hypothyroidism, unspecified: Secondary | ICD-10-CM | POA: Diagnosis not present

## 2019-12-09 DIAGNOSIS — R410 Disorientation, unspecified: Secondary | ICD-10-CM | POA: Diagnosis not present

## 2019-12-09 DIAGNOSIS — R270 Ataxia, unspecified: Secondary | ICD-10-CM | POA: Diagnosis not present

## 2019-12-09 DIAGNOSIS — I10 Essential (primary) hypertension: Secondary | ICD-10-CM | POA: Diagnosis not present

## 2019-12-09 DIAGNOSIS — Z79899 Other long term (current) drug therapy: Secondary | ICD-10-CM | POA: Diagnosis not present

## 2019-12-11 DIAGNOSIS — R270 Ataxia, unspecified: Secondary | ICD-10-CM | POA: Diagnosis not present

## 2019-12-11 DIAGNOSIS — E039 Hypothyroidism, unspecified: Secondary | ICD-10-CM | POA: Diagnosis not present

## 2019-12-11 DIAGNOSIS — I10 Essential (primary) hypertension: Secondary | ICD-10-CM | POA: Diagnosis not present

## 2019-12-11 DIAGNOSIS — R410 Disorientation, unspecified: Secondary | ICD-10-CM | POA: Diagnosis not present

## 2019-12-11 DIAGNOSIS — Z79899 Other long term (current) drug therapy: Secondary | ICD-10-CM | POA: Diagnosis not present

## 2019-12-16 ENCOUNTER — Ambulatory Visit: Payer: PPO

## 2019-12-17 DIAGNOSIS — E039 Hypothyroidism, unspecified: Secondary | ICD-10-CM | POA: Diagnosis not present

## 2019-12-17 DIAGNOSIS — R270 Ataxia, unspecified: Secondary | ICD-10-CM | POA: Diagnosis not present

## 2019-12-17 DIAGNOSIS — J3089 Other allergic rhinitis: Secondary | ICD-10-CM | POA: Diagnosis not present

## 2019-12-17 DIAGNOSIS — H409 Unspecified glaucoma: Secondary | ICD-10-CM | POA: Diagnosis not present

## 2019-12-17 DIAGNOSIS — E1151 Type 2 diabetes mellitus with diabetic peripheral angiopathy without gangrene: Secondary | ICD-10-CM | POA: Diagnosis not present

## 2019-12-17 DIAGNOSIS — I2510 Atherosclerotic heart disease of native coronary artery without angina pectoris: Secondary | ICD-10-CM | POA: Diagnosis not present

## 2019-12-17 DIAGNOSIS — M069 Rheumatoid arthritis, unspecified: Secondary | ICD-10-CM | POA: Diagnosis not present

## 2019-12-17 DIAGNOSIS — E669 Obesity, unspecified: Secondary | ICD-10-CM | POA: Diagnosis not present

## 2019-12-17 DIAGNOSIS — E785 Hyperlipidemia, unspecified: Secondary | ICD-10-CM | POA: Diagnosis not present

## 2019-12-17 DIAGNOSIS — H9193 Unspecified hearing loss, bilateral: Secondary | ICD-10-CM | POA: Diagnosis not present

## 2019-12-17 DIAGNOSIS — G4733 Obstructive sleep apnea (adult) (pediatric): Secondary | ICD-10-CM | POA: Diagnosis not present

## 2019-12-17 DIAGNOSIS — Z8673 Personal history of transient ischemic attack (TIA), and cerebral infarction without residual deficits: Secondary | ICD-10-CM | POA: Diagnosis not present

## 2019-12-17 DIAGNOSIS — Z9181 History of falling: Secondary | ICD-10-CM | POA: Diagnosis not present

## 2019-12-17 DIAGNOSIS — I10 Essential (primary) hypertension: Secondary | ICD-10-CM | POA: Diagnosis not present

## 2019-12-17 DIAGNOSIS — Z6832 Body mass index (BMI) 32.0-32.9, adult: Secondary | ICD-10-CM | POA: Diagnosis not present

## 2019-12-17 DIAGNOSIS — E1142 Type 2 diabetes mellitus with diabetic polyneuropathy: Secondary | ICD-10-CM | POA: Diagnosis not present

## 2019-12-17 DIAGNOSIS — Z7984 Long term (current) use of oral hypoglycemic drugs: Secondary | ICD-10-CM | POA: Diagnosis not present

## 2019-12-17 DIAGNOSIS — M1990 Unspecified osteoarthritis, unspecified site: Secondary | ICD-10-CM | POA: Diagnosis not present

## 2019-12-17 DIAGNOSIS — Z87891 Personal history of nicotine dependence: Secondary | ICD-10-CM | POA: Diagnosis not present

## 2019-12-17 DIAGNOSIS — Z86010 Personal history of colonic polyps: Secondary | ICD-10-CM | POA: Diagnosis not present

## 2019-12-30 DIAGNOSIS — I2510 Atherosclerotic heart disease of native coronary artery without angina pectoris: Secondary | ICD-10-CM | POA: Diagnosis not present

## 2019-12-30 DIAGNOSIS — E1142 Type 2 diabetes mellitus with diabetic polyneuropathy: Secondary | ICD-10-CM | POA: Diagnosis not present

## 2019-12-30 DIAGNOSIS — E1151 Type 2 diabetes mellitus with diabetic peripheral angiopathy without gangrene: Secondary | ICD-10-CM | POA: Diagnosis not present

## 2019-12-30 DIAGNOSIS — I10 Essential (primary) hypertension: Secondary | ICD-10-CM | POA: Diagnosis not present

## 2019-12-30 DIAGNOSIS — E039 Hypothyroidism, unspecified: Secondary | ICD-10-CM | POA: Diagnosis not present

## 2019-12-30 DIAGNOSIS — M069 Rheumatoid arthritis, unspecified: Secondary | ICD-10-CM | POA: Diagnosis not present

## 2019-12-30 DIAGNOSIS — M1990 Unspecified osteoarthritis, unspecified site: Secondary | ICD-10-CM | POA: Diagnosis not present

## 2020-01-08 DIAGNOSIS — E669 Obesity, unspecified: Secondary | ICD-10-CM | POA: Diagnosis not present

## 2020-01-08 DIAGNOSIS — G4733 Obstructive sleep apnea (adult) (pediatric): Secondary | ICD-10-CM | POA: Diagnosis not present

## 2020-01-08 DIAGNOSIS — M069 Rheumatoid arthritis, unspecified: Secondary | ICD-10-CM | POA: Diagnosis not present

## 2020-01-08 DIAGNOSIS — R270 Ataxia, unspecified: Secondary | ICD-10-CM | POA: Diagnosis not present

## 2020-01-08 DIAGNOSIS — E1151 Type 2 diabetes mellitus with diabetic peripheral angiopathy without gangrene: Secondary | ICD-10-CM | POA: Diagnosis not present

## 2020-01-08 DIAGNOSIS — E785 Hyperlipidemia, unspecified: Secondary | ICD-10-CM | POA: Diagnosis not present

## 2020-01-08 DIAGNOSIS — E1142 Type 2 diabetes mellitus with diabetic polyneuropathy: Secondary | ICD-10-CM | POA: Diagnosis not present

## 2020-01-08 DIAGNOSIS — H9193 Unspecified hearing loss, bilateral: Secondary | ICD-10-CM | POA: Diagnosis not present

## 2020-01-08 DIAGNOSIS — Z7984 Long term (current) use of oral hypoglycemic drugs: Secondary | ICD-10-CM | POA: Diagnosis not present

## 2020-01-08 DIAGNOSIS — Z8673 Personal history of transient ischemic attack (TIA), and cerebral infarction without residual deficits: Secondary | ICD-10-CM | POA: Diagnosis not present

## 2020-01-08 DIAGNOSIS — J3089 Other allergic rhinitis: Secondary | ICD-10-CM | POA: Diagnosis not present

## 2020-01-08 DIAGNOSIS — H409 Unspecified glaucoma: Secondary | ICD-10-CM | POA: Diagnosis not present

## 2020-01-08 DIAGNOSIS — Z9181 History of falling: Secondary | ICD-10-CM | POA: Diagnosis not present

## 2020-01-08 DIAGNOSIS — I10 Essential (primary) hypertension: Secondary | ICD-10-CM | POA: Diagnosis not present

## 2020-01-08 DIAGNOSIS — Z86010 Personal history of colonic polyps: Secondary | ICD-10-CM | POA: Diagnosis not present

## 2020-01-08 DIAGNOSIS — Z87891 Personal history of nicotine dependence: Secondary | ICD-10-CM | POA: Diagnosis not present

## 2020-01-08 DIAGNOSIS — Z6832 Body mass index (BMI) 32.0-32.9, adult: Secondary | ICD-10-CM | POA: Diagnosis not present

## 2020-01-08 DIAGNOSIS — E039 Hypothyroidism, unspecified: Secondary | ICD-10-CM | POA: Diagnosis not present

## 2020-01-08 DIAGNOSIS — I2510 Atherosclerotic heart disease of native coronary artery without angina pectoris: Secondary | ICD-10-CM | POA: Diagnosis not present

## 2020-01-08 DIAGNOSIS — M1990 Unspecified osteoarthritis, unspecified site: Secondary | ICD-10-CM | POA: Diagnosis not present

## 2020-01-10 ENCOUNTER — Other Ambulatory Visit: Payer: Self-pay | Admitting: Physician Assistant

## 2020-01-10 DIAGNOSIS — Z952 Presence of prosthetic heart valve: Secondary | ICD-10-CM

## 2020-01-21 DIAGNOSIS — Z86010 Personal history of colonic polyps: Secondary | ICD-10-CM | POA: Diagnosis not present

## 2020-01-21 DIAGNOSIS — E039 Hypothyroidism, unspecified: Secondary | ICD-10-CM | POA: Diagnosis not present

## 2020-01-21 DIAGNOSIS — G4733 Obstructive sleep apnea (adult) (pediatric): Secondary | ICD-10-CM | POA: Diagnosis not present

## 2020-01-21 DIAGNOSIS — M1990 Unspecified osteoarthritis, unspecified site: Secondary | ICD-10-CM | POA: Diagnosis not present

## 2020-01-21 DIAGNOSIS — Z6832 Body mass index (BMI) 32.0-32.9, adult: Secondary | ICD-10-CM | POA: Diagnosis not present

## 2020-01-21 DIAGNOSIS — R270 Ataxia, unspecified: Secondary | ICD-10-CM | POA: Diagnosis not present

## 2020-01-21 DIAGNOSIS — Z9181 History of falling: Secondary | ICD-10-CM | POA: Diagnosis not present

## 2020-01-21 DIAGNOSIS — M069 Rheumatoid arthritis, unspecified: Secondary | ICD-10-CM | POA: Diagnosis not present

## 2020-01-21 DIAGNOSIS — Z7984 Long term (current) use of oral hypoglycemic drugs: Secondary | ICD-10-CM | POA: Diagnosis not present

## 2020-01-21 DIAGNOSIS — H9193 Unspecified hearing loss, bilateral: Secondary | ICD-10-CM | POA: Diagnosis not present

## 2020-01-21 DIAGNOSIS — I2510 Atherosclerotic heart disease of native coronary artery without angina pectoris: Secondary | ICD-10-CM | POA: Diagnosis not present

## 2020-01-21 DIAGNOSIS — J3089 Other allergic rhinitis: Secondary | ICD-10-CM | POA: Diagnosis not present

## 2020-01-21 DIAGNOSIS — Z8673 Personal history of transient ischemic attack (TIA), and cerebral infarction without residual deficits: Secondary | ICD-10-CM | POA: Diagnosis not present

## 2020-01-21 DIAGNOSIS — E1151 Type 2 diabetes mellitus with diabetic peripheral angiopathy without gangrene: Secondary | ICD-10-CM | POA: Diagnosis not present

## 2020-01-21 DIAGNOSIS — E669 Obesity, unspecified: Secondary | ICD-10-CM | POA: Diagnosis not present

## 2020-01-21 DIAGNOSIS — E1142 Type 2 diabetes mellitus with diabetic polyneuropathy: Secondary | ICD-10-CM | POA: Diagnosis not present

## 2020-01-21 DIAGNOSIS — E785 Hyperlipidemia, unspecified: Secondary | ICD-10-CM | POA: Diagnosis not present

## 2020-01-21 DIAGNOSIS — Z87891 Personal history of nicotine dependence: Secondary | ICD-10-CM | POA: Diagnosis not present

## 2020-01-21 DIAGNOSIS — H409 Unspecified glaucoma: Secondary | ICD-10-CM | POA: Diagnosis not present

## 2020-01-21 DIAGNOSIS — I10 Essential (primary) hypertension: Secondary | ICD-10-CM | POA: Diagnosis not present

## 2020-01-30 ENCOUNTER — Ambulatory Visit: Payer: PPO | Admitting: Urology

## 2020-02-05 DIAGNOSIS — R531 Weakness: Secondary | ICD-10-CM | POA: Diagnosis not present

## 2020-02-05 DIAGNOSIS — E039 Hypothyroidism, unspecified: Secondary | ICD-10-CM | POA: Diagnosis not present

## 2020-02-07 DIAGNOSIS — M069 Rheumatoid arthritis, unspecified: Secondary | ICD-10-CM | POA: Diagnosis not present

## 2020-02-07 DIAGNOSIS — Z87891 Personal history of nicotine dependence: Secondary | ICD-10-CM | POA: Diagnosis not present

## 2020-02-07 DIAGNOSIS — I10 Essential (primary) hypertension: Secondary | ICD-10-CM | POA: Diagnosis not present

## 2020-02-07 DIAGNOSIS — E669 Obesity, unspecified: Secondary | ICD-10-CM | POA: Diagnosis not present

## 2020-02-07 DIAGNOSIS — E785 Hyperlipidemia, unspecified: Secondary | ICD-10-CM | POA: Diagnosis not present

## 2020-02-07 DIAGNOSIS — Z9181 History of falling: Secondary | ICD-10-CM | POA: Diagnosis not present

## 2020-02-07 DIAGNOSIS — Z6832 Body mass index (BMI) 32.0-32.9, adult: Secondary | ICD-10-CM | POA: Diagnosis not present

## 2020-02-07 DIAGNOSIS — H9193 Unspecified hearing loss, bilateral: Secondary | ICD-10-CM | POA: Diagnosis not present

## 2020-02-07 DIAGNOSIS — E1142 Type 2 diabetes mellitus with diabetic polyneuropathy: Secondary | ICD-10-CM | POA: Diagnosis not present

## 2020-02-07 DIAGNOSIS — G4733 Obstructive sleep apnea (adult) (pediatric): Secondary | ICD-10-CM | POA: Diagnosis not present

## 2020-02-07 DIAGNOSIS — Z7984 Long term (current) use of oral hypoglycemic drugs: Secondary | ICD-10-CM | POA: Diagnosis not present

## 2020-02-07 DIAGNOSIS — R270 Ataxia, unspecified: Secondary | ICD-10-CM | POA: Diagnosis not present

## 2020-02-07 DIAGNOSIS — Z86010 Personal history of colonic polyps: Secondary | ICD-10-CM | POA: Diagnosis not present

## 2020-02-07 DIAGNOSIS — E039 Hypothyroidism, unspecified: Secondary | ICD-10-CM | POA: Diagnosis not present

## 2020-02-07 DIAGNOSIS — Z8673 Personal history of transient ischemic attack (TIA), and cerebral infarction without residual deficits: Secondary | ICD-10-CM | POA: Diagnosis not present

## 2020-02-07 DIAGNOSIS — I2510 Atherosclerotic heart disease of native coronary artery without angina pectoris: Secondary | ICD-10-CM | POA: Diagnosis not present

## 2020-02-07 DIAGNOSIS — J3089 Other allergic rhinitis: Secondary | ICD-10-CM | POA: Diagnosis not present

## 2020-02-07 DIAGNOSIS — E1151 Type 2 diabetes mellitus with diabetic peripheral angiopathy without gangrene: Secondary | ICD-10-CM | POA: Diagnosis not present

## 2020-02-07 DIAGNOSIS — M1990 Unspecified osteoarthritis, unspecified site: Secondary | ICD-10-CM | POA: Diagnosis not present

## 2020-02-07 DIAGNOSIS — H409 Unspecified glaucoma: Secondary | ICD-10-CM | POA: Diagnosis not present

## 2020-02-27 DIAGNOSIS — E1142 Type 2 diabetes mellitus with diabetic polyneuropathy: Secondary | ICD-10-CM | POA: Diagnosis not present

## 2020-02-27 DIAGNOSIS — I2510 Atherosclerotic heart disease of native coronary artery without angina pectoris: Secondary | ICD-10-CM | POA: Diagnosis not present

## 2020-02-27 DIAGNOSIS — E785 Hyperlipidemia, unspecified: Secondary | ICD-10-CM | POA: Diagnosis not present

## 2020-02-27 DIAGNOSIS — E1151 Type 2 diabetes mellitus with diabetic peripheral angiopathy without gangrene: Secondary | ICD-10-CM | POA: Diagnosis not present

## 2020-02-27 DIAGNOSIS — E669 Obesity, unspecified: Secondary | ICD-10-CM | POA: Diagnosis not present

## 2020-02-27 DIAGNOSIS — I69398 Other sequelae of cerebral infarction: Secondary | ICD-10-CM | POA: Diagnosis not present

## 2020-02-27 DIAGNOSIS — I119 Hypertensive heart disease without heart failure: Secondary | ICD-10-CM | POA: Diagnosis not present

## 2020-04-03 DEATH — deceased

## 2020-12-03 ENCOUNTER — Other Ambulatory Visit: Payer: Self-pay | Admitting: Internal Medicine

## 2020-12-03 DIAGNOSIS — R410 Disorientation, unspecified: Secondary | ICD-10-CM

## 2020-12-03 DIAGNOSIS — R270 Ataxia, unspecified: Secondary | ICD-10-CM
